# Patient Record
Sex: Female | Born: 1945
Health system: Southern US, Community
[De-identification: ages and names within clinical notes are randomized; demographics above are authoritative.]

## PROBLEM LIST (undated history)

## (undated) DIAGNOSIS — F32A Depression, unspecified: Secondary | ICD-10-CM

## (undated) DIAGNOSIS — I10 Essential (primary) hypertension: Secondary | ICD-10-CM

## (undated) DIAGNOSIS — F419 Anxiety disorder, unspecified: Secondary | ICD-10-CM

## (undated) DIAGNOSIS — G629 Polyneuropathy, unspecified: Secondary | ICD-10-CM

## (undated) DIAGNOSIS — K219 Gastro-esophageal reflux disease without esophagitis: Secondary | ICD-10-CM

## (undated) DIAGNOSIS — F329 Major depressive disorder, single episode, unspecified: Secondary | ICD-10-CM

## (undated) HISTORY — PX: OTHER SURGICAL HISTORY: SHX169

## (undated) HISTORY — DX: Anxiety disorder, unspecified: F41.9

## (undated) HISTORY — DX: Gastro-esophageal reflux disease without esophagitis: K21.9

## (undated) HISTORY — DX: Depression, unspecified: F32.A

## (undated) HISTORY — PX: NOSE SURGERY: SHX723

## (undated) HISTORY — DX: Essential (primary) hypertension: I10

## (undated) HISTORY — DX: Polyneuropathy, unspecified: G62.9

---
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
## 2016-04-04 DIAGNOSIS — R87612 Low grade squamous intraepithelial lesion on cytologic smear of cervix (LGSIL): Secondary | ICD-10-CM | POA: Insufficient documentation

## 2016-04-04 DIAGNOSIS — E782 Mixed hyperlipidemia: Secondary | ICD-10-CM | POA: Insufficient documentation

## 2018-08-07 DIAGNOSIS — S022XXA Fracture of nasal bones, initial encounter for closed fracture: Secondary | ICD-10-CM | POA: Insufficient documentation

## 2018-09-18 DIAGNOSIS — F1020 Alcohol dependence, uncomplicated: Secondary | ICD-10-CM | POA: Insufficient documentation

## 2018-09-18 DIAGNOSIS — W19XXXA Unspecified fall, initial encounter: Secondary | ICD-10-CM | POA: Insufficient documentation

## 2018-09-18 DIAGNOSIS — F32A Depression, unspecified: Secondary | ICD-10-CM | POA: Insufficient documentation

## 2018-09-18 DIAGNOSIS — S82851A Displaced trimalleolar fracture of right lower leg, initial encounter for closed fracture: Secondary | ICD-10-CM | POA: Insufficient documentation

## 2018-09-18 DIAGNOSIS — R55 Syncope and collapse: Secondary | ICD-10-CM | POA: Insufficient documentation

## 2019-08-24 ENCOUNTER — Encounter

## 2019-08-24 ENCOUNTER — Ambulatory Visit: Payer: Medicare Other | Admitting: Family Medicine

## 2019-08-24 DIAGNOSIS — Z0289 Encounter for other administrative examinations: Secondary | ICD-10-CM

## 2019-09-21 ENCOUNTER — Other Ambulatory Visit: Payer: Self-pay

## 2019-09-21 ENCOUNTER — Ambulatory Visit: Payer: Medicare Other | Admitting: Family Medicine

## 2019-09-21 ENCOUNTER — Encounter: Payer: Self-pay | Admitting: Family Medicine

## 2019-09-21 VITALS — BP 112/76 | HR 84 | Temp 97.0°F | Resp 16 | Ht 65.0 in | Wt 157.0 lb

## 2019-09-21 DIAGNOSIS — R7401 Elevation of levels of liver transaminase levels: Secondary | ICD-10-CM

## 2019-09-21 DIAGNOSIS — Z87898 Personal history of other specified conditions: Secondary | ICD-10-CM

## 2019-09-21 DIAGNOSIS — I10 Essential (primary) hypertension: Secondary | ICD-10-CM

## 2019-09-21 DIAGNOSIS — M545 Low back pain, unspecified: Secondary | ICD-10-CM

## 2019-09-21 DIAGNOSIS — G894 Chronic pain syndrome: Secondary | ICD-10-CM

## 2019-09-21 DIAGNOSIS — G6289 Other specified polyneuropathies: Secondary | ICD-10-CM

## 2019-09-21 DIAGNOSIS — M720 Palmar fascial fibromatosis [Dupuytren]: Secondary | ICD-10-CM

## 2019-09-21 DIAGNOSIS — R2681 Unsteadiness on feet: Secondary | ICD-10-CM

## 2019-09-21 DIAGNOSIS — G8929 Other chronic pain: Secondary | ICD-10-CM

## 2019-09-21 DIAGNOSIS — R600 Localized edema: Secondary | ICD-10-CM | POA: Diagnosis not present

## 2019-09-21 LAB — COMPREHENSIVE METABOLIC PANEL
ALT: 38 U/L — ABNORMAL HIGH (ref 0–35)
AST: 31 U/L (ref 0–37)
Albumin: 4.6 g/dL (ref 3.5–5.2)
Alkaline Phosphatase: 69 U/L (ref 39–117)
BUN: 18 mg/dL (ref 6–23)
CO2: 26 mEq/L (ref 19–32)
Calcium: 10.1 mg/dL (ref 8.4–10.5)
Chloride: 99 mEq/L (ref 96–112)
Creatinine, Ser: 1.05 mg/dL (ref 0.40–1.20)
GFR: 51.32 mL/min — ABNORMAL LOW (ref >60.00)
Glucose, Bld: 88 mg/dL (ref 70–99)
Potassium: 4.1 mEq/L (ref 3.5–5.1)
Sodium: 136 mEq/L (ref 135–145)
Total Bilirubin: 0.9 mg/dL (ref 0.2–1.2)
Total Protein: 7.1 g/dL (ref 6.0–8.3)

## 2019-09-21 NOTE — Patient Instructions (Signed)
A few things to remember from today's visit:   Elevated transaminase level - Plan: CMP  Bilateral lower extremity edema  Benign essential hypertension - Plan: CMP  Other polyneuropathy - Plan: Ambulatory referral to Neurology  Unsteady gait  Chronic low back pain without sciatica, unspecified back pain laterality - Plan: Ambulatory referral to Chiropractic  Dupuytren contracture - Plan: Ambulatory referral to Orthopedic Surgery  Today we decrease dose of furosemide, from 40 mg to 20 mg daily, and metoprolol succinate from 50 mg to 25 mg daily. Continue monitoring blood pressure at home. We will arrange home health for physical therapy. Recommend decreasing alcohol intake, 4 ounces daily is the recommended dose for women.  You can arrange appointment with podiatrist, [phone].  Please be sure medication list is accurate. If a new problem present, please set up appointment sooner than planned today.

## 2019-09-21 NOTE — Progress Notes (Signed)
HPI:   [name] is a 73 y.o. female, who is here today to establish care.  Former PCP: Dr Rebecca Baird Last preventive routine visit: Fall 2019.  Chronic medical problems: Syncope, peripheral neuropathy, dizziness, hypertension, exertional dyspnea, aortic valve stenosis, former smoker, chronic pain, GERD, alcohol abuse, anxiety, depression, and OA ambulance home.  She drinks about 2 to 3 glasses of wine daily. She is on thiamine 100 mg daily B12 at 1000 mcg daily.  Depression and anxiety: Currently she is on citalopram 10 mg daily. She was diagnosed with depression around 2009-01-20 after the death of her husband. She feels like the pressure is better and eventually she would like to wean off medication, she tried once but had to resume it, she realized that she was feeling better while taking medication.  She takes furosemide 40 mg daily, she is not sure about indication, she thinks it is to treat lower extremity edema, which she is "bad." She denies orthopnea or PND. No known history of CHF.  Hypertension: Currently she is on metoprolol succinate 50 mg daily. She is checking BP at home: 105/63, 116/72, 125/74, and 112\76. BP has been "very low", SBP in the 90's sometimes.  Dizziness and lightheadedness, aggravated by rapid rise from sitting position. Episodes of syncope since 01-20-2014, she is not sure about exacerbating factors. There is no associated urine/bowel incontinence or postictal-like symptoms. Negative for seizure-like activity.  She denies chest pain, dyspnea, palpitations, or urinary symptoms.  Results for orders placed or performed during the hospital encounter of 07/01/18  Magnesium Level  Result Value Ref Range  Magnesium 1.6 1.6 - 2.7 mg/dL  CBC with Differential  Result Value Ref Range  Differential Percent Diff %  Differential Absolute Diff Absolute  WBC 10.2 3.6 - 11.2 K/uL  RBC 3.82 3.63 - 4.92 M/uL  Hemoglobin 13.0 11.4 - 15.0 g/dL   Hematocrit 39 31 - 42 %  Mean Cell Volume 103 (H) 74 - 96 fL  Mean Cell Hemoglobin 34 (H) 26 - 33 pg  Mean Cell Hemoglobin Concentration 33 33 - 36 g/dL  RDW 13.9 12.3 - 17.0 %  Platelet Count 243 150 - 450 K/uL  Mean Platelet Volume 7.5 7.5 - 11.2 fL  Neutrophils 83 (H) 37 - 80 %  Lymphocytes 9 (L) 10 - 50 %  Monocytes 6 0 - 12 %  Eosinophils 1 0 - 7 %  Basophils 1 0 - 2 %  Neutrophils Absolute 8.6 1.3 - 9.0 K/uL  Lymphocytes Absolute 0.9 0.4 - 5.6 K/uL  Monocytes Absolute 0.6 0.0 - 1.3 K/uL  Eosinophils Absolute 0.1 0.0 - 0.8 K/uL  Basophils Absolute 0.1 0.0 - 0.2 K/uL  Nucleated RBCS 0 /100 WBC  BMP  Result Value Ref Range  Sodium 138 136 - 145 mmol/L  Potassium 3.8 3.5 - 5.1 mmol/L  Chloride 99 99 - 108 mmol/L  CO2 23 21 - 31 mmol/L  BUN 12 7 - 25 mg/dL  Creatinine 0.82 0.51 - 1.00 mg/dL  Glucose, Random 104 70 - 199 mg/dL  Calcium, Total 9.7 8.8 - 10.6 mg/dL  Osmolality (calculated) 276 270 - 295 mOsm/kg  Anion Gap 16 (H) 3 - 11  Troponin-I  Result Value Ref Range  Troponin-I <0.03 <0.05 ng/mL  EGFR (MDRD)  Result Value Ref Range  EGFR >60 mL/min/1.47m  Lab Add On  Result Value Ref Range  Lab Add On PROCESSED  PT/INR  Result Value Ref Range  PT 11.7 9.9 -  12.7 sec  INR 1.03 <1.30  Alcohol, Ethyl Level  Result Value Ref Range  Alcohol, Ethyl 57 0 - 80 mg/dL  Hepatic Function Panel  Result Value Ref Range  Albumin 4.1 3.5 - 5.7 g/dL  Bilirubin, Total 0.9 0.3 - 1.0 mg/dL  Bilirubin, Direct 0.3 (H) 0.0 - 0.2 mg/dL  Alkaline Phosphatase 111 (H) 34 - 104 IU/L  Protein, Total 6.9 6.4 - 8.9 g/dL  ALT 29 7 - 52 IU/L  AST 44 (H) 13 - 39 IU/L  Albumin/Globulin Ratio 1.5 1.2 - 2.3    GERD: Currently she is on omeprazole 20 mg, which she has taken for years and helping with heartburn. No changes in bowel habits, nausea, vomiting, or melena. Colonoscopy done about 11 years ago, for now she is interested in scheduling procedure.  Concerns today:  She has a  list of request that include several referrals to different specialities.  Peripheral neuropathy: She tried gabapentin, she is not sure why she discontinued medication. Following with neurologist, she needs a referral. According to patient, this is a hepatic. Numbness, tingling, and burning sensation on lower extremities and hands. Problem seems to be stable. She has been treated by pain management, currently she is on Nucynta ER 50 mg, which she takes once daily even though it was prescribed twice daily.  Tolerating medication well, she is not sure if medication is helping with neuropathy pain.  Complaining of lower back pain, no radiated.  This problem started early this year. Exacerbated by prolonged standing and bending when doing chores around the house (cleaning). Alleviated by rest. She has had lower back pain before, she was following with a chiropractor and it really helped at that time. Pain is "not bad."  Unstable gait. According to patient, she has had several fractures due to "fainting", some episodes of syncope. Last fall was complicated with left ankle fracture, she was supposed to do PT at home after inpatient rehab but due to to COVID-19, this was not possible. She states that she needs help with some ADLs, her husband helps her with showering. She has a cane for transfer. She does not drive.  Dupuytren's contracture, changes referral to orthopedist. She has received injections to treat 5th right finger but did not help. She feels like problem is developing in other fingers,left hand. She is right handed.  She also needs to see a podiatrist in the area. History of hammertoes.  Review of Systems  Constitutional: Positive for fatigue. Negative for activity change, appetite change and fever.  HENT: Negative for mouth sores, nosebleeds and trouble swallowing.   Eyes: Negative for redness and visual disturbance.  Respiratory: Negative for cough and wheezing.    Gastrointestinal: Negative for abdominal pain, nausea and vomiting.       Negative for changes in bowel habits.  Genitourinary: Negative for decreased urine volume, dysuria and hematuria.  Musculoskeletal: Positive for arthralgias and gait problem. Negative for joint swelling.  Skin: Negative for rash and wound.  Neurological: Negative for facial asymmetry, speech difficulty, weakness and headaches.  Psychiatric/Behavioral: Negative for confusion. The patient is nervous/anxious.   Rest see pertinent positives and negatives per HPI.   Current Outpatient Medications on File Prior to Visit  Medication Sig Dispense Refill  . Ascorbic Acid (VITAMIN C) 1000 MG tablet Take 1,000 mg by mouth daily.    . citalopram (CELEXA) 10 MG tablet Take 10 mg by mouth daily.    . Cyanocobalamin (VITAMIN B12 PO) Take 5,000 mg by mouth daily.    Marland Kitchen  folic acid (FOLVITE) 161 MCG tablet Take 400 mcg by mouth daily.    . furosemide (LASIX) 40 MG tablet Take 40 mg by mouth daily.    . Metoprolol Succinate 50 MG CS24 Take 50 mg by mouth daily.    . Omeprazole 20 MG TBDD Take 20 mg by mouth daily.    . tapentadol (NUCYNTA ER) 50 MG 12 hr tablet Take 50 mg by mouth 2 (two) times daily.    Marland Kitchen thiamine (VITAMIN B-1) 100 MG tablet Take 100 mg by mouth daily.    . valsartan (DIOVAN) 160 MG tablet Take 160 mg by mouth daily.     No current facility-administered medications on file prior to visit.      History reviewed. No pertinent past medical history. No Known Allergies  History reviewed. No pertinent family history.  Social History   Socioeconomic History  . Marital status: Married    Spouse name: Not on file  . Number of children: Not on file  . Years of education: Not on file  . Highest education level: Not on file  Occupational History  . Not on file  Social Needs  . Financial resource strain: Not on file  . Food insecurity    Worry: Not on file    Inability: Not on file  . Transportation needs     Medical: Not on file    Non-medical: Not on file  Tobacco Use  . Smoking status: Not on file  Substance and Sexual Activity  . Alcohol use: Not on file  . Drug use: Not on file  . Sexual activity: Not on file  Lifestyle  . Physical activity    Days per week: Not on file    Minutes per session: Not on file  . Stress: Not on file  Relationships  . Social Herbalist on phone: Not on file    Gets together: Not on file    Attends religious service: Not on file    Active member of club or organization: Not on file    Attends meetings of clubs or organizations: Not on file    Relationship status: Not on file  Other Topics Concern  . Not on file  Social History Narrative  . Not on file    Vitals:   09/21/19 1152  BP: 112/76  Pulse: 84  Resp: 16  Temp: (!) 97 F (36.1 C)  SpO2: 97%    Body mass index is 26.13 kg/m.  Physical Exam  Nursing note and vitals reviewed. Constitutional: She is oriented to person, place, and time. She appears well-developed. She does not appear ill. No distress.  HENT:  Head: Normocephalic and atraumatic.  Mouth/Throat: Oropharynx is clear and moist and mucous membranes are normal.  Eyes: Pupils are equal, round, and reactive to light. Conjunctivae are normal. Right eye exhibits abnormal extraocular motion.  Cardiovascular: Normal rate and regular rhythm.  Murmur (soft SEM RUSB) heard. Pulses:      Dorsalis pedis pulses are 2+ on the right side and 2+ on the left side.  Respiratory: Effort normal and breath sounds normal. No respiratory distress.  GI: Soft. She exhibits no mass. There is no hepatomegaly. There is no abdominal tenderness.  Musculoskeletal:        General: Edema (Trace pitting LE edema,bilateral.) present.     Right hand: She exhibits decreased range of motion and deformity (Right 5th finget:fixed flexion.). She exhibits no tenderness.  Lymphadenopathy:    She has no  cervical adenopathy.  Neurological: She is alert  and oriented to person, place, and time. She has normal strength. No cranial nerve deficit. Gait abnormal.  Unstable gait, she is using a cane.  Skin: Skin is warm. No rash noted. No erythema.  Psychiatric: Her mood appears anxious.  Well groomed, good eye contact.   ASSESSMENT AND PLAN:  Ms. Kadasia was seen today for establish care.  Diagnoses and all orders for this visit:  Lab Results  Component Value Date   ALT 38 (H) 09/21/2019   AST 31 09/21/2019   ALKPHOS 69 09/21/2019   BILITOT 0.9 09/21/2019   Lab Results  Component Value Date   CREATININE 1.05 09/21/2019   BUN 18 09/21/2019   NA 136 09/21/2019   K 4.1 09/21/2019   CL 99 09/21/2019   CO2 26 09/21/2019    Elevated transaminase level Strongly recommend decreasing alcohol intake, educated about current recommendations for women in regard to alcohol consumption. We also discussed adverse effects of high alcohol consumption.  -     CMP  Bilateral lower extremity edema Possible etiology discussed,?  Vein disease. Recommend lower extremity elevation and/or compression stockings. We discussed some side effects of diuretics, she agrees with decreasing dose of furosemide from 40 mg to 20 mg daily.  Benign essential hypertension BP mildly low. Recommend decreasing metoprolol succinate 50 mg to 25 mg. Continue monitoring BP regularly. Continue low-salt diet.  -     CMP  Other polyneuropathy Apparently gabapentin did not help. Continue follow-up with neurology. She does not recall trying Cymbalta, this could be something we can try in the future. Appropriate foot care.  Pain that she does not need a referral for podiatrist, she will let me know if she does.  -     Ambulatory referral to Neurology -     Ambulatory referral to [place]a gait High risk for falls, aggravated by chronic medical problems as well as medications. Fall precautions discussed. PT for fall prevention will be arranged  through home health.  Chronic low back pain without sciatica, unspecified back pain laterality States that she needs a referral for chiropractor, it was placed. PT through home health may also help.  -     Ambulatory referral to Chiropractic -     Ambulatory referral to [place] contracture Problem is getting worse. Referral placed as requested.  -     Ambulatory referral to Orthopedic Surgery  Chronic pain disorder For now we will continue prescribing Nucynta. Last prescription for Nucynta ER was on 09/04/2019, 60 tablets. We will have a discussion about med contract and current guidelines in regard to chronic opioid use.  History of syncope No known etiology. ?  Orthostatic hypotension. Some of the medication were adjusted: Furosemide dose decreased and metoprolol succinate was decreased. Instructed about warning signs.    Return in about 4 weeks (around 10/19/2019) for Pain ,HTN,LE edema.     G. Martinique, MD  [facility]. [place] office.

## 2019-09-22 ENCOUNTER — Encounter: Payer: Self-pay | Admitting: Family Medicine

## 2019-09-22 DIAGNOSIS — R7989 Other specified abnormal findings of blood chemistry: Secondary | ICD-10-CM | POA: Insufficient documentation

## 2019-09-22 DIAGNOSIS — G629 Polyneuropathy, unspecified: Secondary | ICD-10-CM | POA: Insufficient documentation

## 2019-09-22 DIAGNOSIS — G894 Chronic pain syndrome: Secondary | ICD-10-CM | POA: Insufficient documentation

## 2019-09-22 DIAGNOSIS — R945 Abnormal results of liver function studies: Secondary | ICD-10-CM | POA: Insufficient documentation

## 2019-09-29 ENCOUNTER — Other Ambulatory Visit: Payer: Self-pay

## 2019-09-29 ENCOUNTER — Ambulatory Visit: Payer: Medicare Other | Admitting: Orthopaedic Surgery

## 2019-09-29 DIAGNOSIS — M720 Palmar fascial fibromatosis [Dupuytren]: Secondary | ICD-10-CM | POA: Insufficient documentation

## 2019-09-29 NOTE — Progress Notes (Signed)
Office Visit Note   Patient: Rebecca Baird           Date of Birth: [birth year]           MRN: [id number] Visit Date: 09/29/2019              Requested by: [place], Betty G, MD [address] [place],  [place] [address] PCP: [place], Betty G, MD   Assessment & Plan: Visit Diagnoses:  1. Dupuytren's contracture of right hand     Plan: Impression is right small finger Dupuytren's contracture.  Based on discussion today I will make a referral to Dr. Merlyn Lot for surgical evaluation of her right small finger Dupuytren's contracture.  Follow-up as needed.  Follow-Up Instructions: Return if symptoms worsen or fail to improve.   Orders:  No orders of the defined types were placed in this encounter.  No orders of the defined types were placed in this encounter.     Procedures: No procedures performed   Clinical Data: No additional findings.   Subjective: Chief Complaint  Patient presents with  . Right Hand - Pain    [name] is a 73 year old female comes in for evaluation of her Dupuytren's contracture of the right small finger.  She recently relocated here from [place].  She was originally scheduled for surgery for this but due to the relocation she wants to establish new orthopedic care as well as neurology and primary care.  She has had Xiaflex injections to the finger which caused her tremendous pain and she is not interested in trying this again.   Review of Systems  Constitutional: Negative.   HENT: Negative.   Eyes: Negative.   Respiratory: Negative.   Cardiovascular: Negative.   Endocrine: Negative.   Musculoskeletal: Negative.   Neurological: Negative.   Hematological: Negative.   Psychiatric/Behavioral: Negative.   All other systems reviewed and are negative.    Objective: Vital Signs: There were no vitals taken for this visit.  Physical Exam Vitals signs and nursing note reviewed.  Constitutional:      Appearance: She is well-developed.   HENT:     Head: Normocephalic and atraumatic.  Neck:     Musculoskeletal: Neck supple.  Pulmonary:     Effort: Pulmonary effort is normal.  Abdominal:     Palpations: Abdomen is soft.  Skin:    General: Skin is warm.     Capillary Refill: Capillary refill takes less than 2 seconds.  Neurological:     Mental Status: She is alert and oriented to person, place, and time.  Psychiatric:        Behavior: Behavior normal.        Thought Content: Thought content normal.        Judgment: Judgment normal.     Ortho Exam Examination of the right hand shows mild Dupuytren's contracture of the long finger at the MCP joint.  She has a 90 degree contracture of the PIP joint of the right small finger and a 30 degree contracture of the MCP joint. Specialty Comments:  No specialty comments available.  Imaging: No results found.   PMFS History: Patient Active Problem List   Diagnosis Date Noted  . Dupuytren's contracture of right hand 09/29/2019  . Chronic pain disorder 09/22/2019  . Elevated transaminase level 09/22/2019  . Peripheral neuropathy 09/22/2019  . History of syncope 09/21/2019  . Benign essential hypertension 09/21/2019  . Bilateral lower extremity edema 09/21/2019   No past medical history on file.  No  family history on file.  No past surgical history on file. Social History   Occupational History  . Not on file  Tobacco Use  . Smoking status: Former Smoker    Types: Cigarettes  . Smokeless tobacco: Never Used  Substance and Sexual Activity  . Alcohol use: Yes    Alcohol/week: 3.0 standard drinks    Types: 3 Glasses of wine per week  . Drug use: Not on file  . Sexual activity: Not on file

## 2019-09-30 NOTE — Addendum Note (Signed)
Addended by: Precious Bard on: 09/30/2019 09:23 AM   Modules accepted: Orders

## 2019-10-19 ENCOUNTER — Other Ambulatory Visit: Payer: Self-pay

## 2019-10-20 ENCOUNTER — Ambulatory Visit: Payer: Medicare Other | Admitting: Family Medicine

## 2019-10-20 ENCOUNTER — Encounter: Payer: Self-pay | Admitting: Family Medicine

## 2019-10-20 VITALS — BP 120/70 | HR 102 | Resp 16 | Ht 65.0 in | Wt 161.1 lb

## 2019-10-20 DIAGNOSIS — Z0000 Encounter for general adult medical examination without abnormal findings: Secondary | ICD-10-CM

## 2019-10-20 DIAGNOSIS — I10 Essential (primary) hypertension: Secondary | ICD-10-CM | POA: Diagnosis not present

## 2019-10-20 DIAGNOSIS — R2681 Unsteadiness on feet: Secondary | ICD-10-CM | POA: Insufficient documentation

## 2019-10-20 DIAGNOSIS — R269 Unspecified abnormalities of gait and mobility: Secondary | ICD-10-CM | POA: Insufficient documentation

## 2019-10-20 DIAGNOSIS — G894 Chronic pain syndrome: Secondary | ICD-10-CM

## 2019-10-20 DIAGNOSIS — R7989 Other specified abnormal findings of blood chemistry: Secondary | ICD-10-CM

## 2019-10-20 DIAGNOSIS — R600 Localized edema: Secondary | ICD-10-CM

## 2019-10-20 DIAGNOSIS — R945 Abnormal results of liver function studies: Secondary | ICD-10-CM | POA: Diagnosis not present

## 2019-10-20 NOTE — Assessment & Plan Note (Addendum)
This seems to be a chronic problem.06/2018 alk phosphatase elevated at 111 and AST 44. Reiterated importance of decreasing alcohol intake. We will plan on checking LFTs next visit.

## 2019-10-20 NOTE — Assessment & Plan Note (Addendum)
Stable. Continue furosemide 20 mg daily. We discussed some side effects of medication. Lower extremity elevation may also help, mainly during warm weather, when problem could get worse (vein disease).

## 2019-10-20 NOTE — Assessment & Plan Note (Addendum)
Otherwise adequately controlled. Continue valsartan 160 mg daily and metoprolol succinate 25 mg daily. Continue monitoring BP regularly. Continue low-salt diet. Eye exam is current. Follow-up in 4 months.

## 2019-10-20 NOTE — Progress Notes (Signed)
HPI:   Rebecca Baird is a 73 y.o. female, who is here today for AWV and chronic disease management.  She was last seen on 09/21/2019.  She lives with her husband. She needs assistance with some ADLs and for most IADLs. She does not drive. She has not had any falls in the past year, ongoing PT at home currently.  Functional Status Survey: Is the patient deaf or have difficulty hearing?: No Does the patient have difficulty seeing, even when wearing glasses/contacts?: No Does the patient have difficulty concentrating, remembering, or making decisions?: No Does the patient have difficulty walking or climbing stairs?: Yes Does the patient have difficulty dressing or bathing?: Yes Does the patient have difficulty doing errands alone such as visiting a doctor's office or shopping?: Yes  Fall Risk  10/20/2019  Falls in the past year? 0  Number falls in past yr: 0  Injury with Fall? 0  Risk for fall due to : Impaired balance/gait;Orthopedic patient  Follow up Education provided    Providers she sees regularly: Eye care provider: Dr Dwyane Dee in [place]. She recently saw a new provider in [place] but doe snot remember name. Cardiologist, Dr Shearon Stalls, last seen in 08/2018. Pending appt with neurologist, Dr Posey Pronto.  Depression screen PHQ 2/9 10/20/2019  Decreased Interest 0  Down, Depressed, Hopeless 0  PHQ - 2 Score 0  History of depression, she would like to start weaning off Celexa 10 mg.  Mini-Cog - 10/20/19 1310    Normal clock drawing test?  yes    How many words correct?  3        Hearing Screening   '125Hz'$  '250Hz'$  '500Hz'$  '1000Hz'$  '2000Hz'$  '3000Hz'$  '4000Hz'$  '6000Hz'$  '8000Hz'$   Right ear:           Left ear:             Visual Acuity Screening   Right eye Left eye Both eyes  Without correction:     With correction: '20/25 20/25 20/25 '$   Hypertension: Last visit metoprolol succinate 50 mg was decreased to 25 mg (1/2 tablet). She is also on valsartan 160 mg daily. BP  readings at home: 140/85, 122/66, 114/60, 140/62, 138/66. She denies unusual headache, chest pain, dyspnea, palpitations, focal deficit, or worsening edema.  Lower extremity edema: Last visit furosemide was decreased from 40 mg to 20 mg daily. She feels like lower extremity edema is better. She has not noted erythema, rash, or ulcers.  She has an appointment with neurologist next week to discussed peripheral neuropathy.  Unstable gait has improved with PT at home.  Chronic lower back pain, she canceled appointment with chiropractor because back pain improved with PT. Currently she is on Nucynta ER 50 mg daily, she would like to start weaning medication off.  Review of Systems  Constitutional: Positive for fatigue. Negative for activity change, appetite change and fever.  HENT: Negative for mouth sores, nosebleeds and sore throat.   Eyes: Negative for redness and visual disturbance.  Respiratory: Negative for cough and wheezing.   Gastrointestinal: Negative for abdominal pain, nausea and vomiting.       Negative for changes in bowel habits.  Genitourinary: Negative for decreased urine volume, dysuria and hematuria.  Musculoskeletal: Positive for gait problem.  Skin: Negative for pallor and wound.  Neurological: Negative for syncope, facial asymmetry and speech difficulty.  Psychiatric/Behavioral: Negative for confusion. The patient is nervous/anxious.   Rest of ROS, see pertinent positives sand negatives in HPI  Current Outpatient Medications on File Prior to Visit  Medication Sig Dispense Refill  . Ascorbic Acid (VITAMIN C) 1000 MG tablet Take 1,000 mg by mouth daily.    . citalopram (CELEXA) 10 MG tablet Take 10 mg by mouth daily.    . Cyanocobalamin (VITAMIN B12 PO) Take 5,000 mg by mouth daily.    . folic acid (FOLVITE) 094 MCG tablet Take 400 mcg by mouth daily.    . furosemide (LASIX) 40 MG tablet Take 20 mg by mouth daily.    . Metoprolol Succinate 50 MG CS24 Take 25 mg by  mouth daily.    . Omeprazole 20 MG TBDD Take 20 mg by mouth daily.    . tapentadol (NUCYNTA ER) 50 MG 12 hr tablet Take 50 mg by mouth 2 (two) times daily.    Marland Kitchen thiamine (VITAMIN B-1) 100 MG tablet Take 100 mg by mouth daily.    . valsartan (DIOVAN) 160 MG tablet Take 160 mg by mouth daily.     No current facility-administered medications on file prior to visit.     Past Medical History:  Diagnosis Date  . Anxiety   . Depression   . GERD (gastroesophageal reflux disease)   . Hypertension   . Peripheral neuropathy    Allergies  Allergen Reactions  . Epinephrine Other (See Comments) and Palpitations    "Heart Races Uncomfortably"     Social History   Socioeconomic History  . Marital status: Married    Spouse name: Not on file  . Number of children: Not on file  . Years of education: Not on file  . Highest education level: Not on file  Occupational History  . Not on file  Social Needs  . Financial resource strain: Not on file  . Food insecurity    Worry: Not on file    Inability: Not on file  . Transportation needs    Medical: Not on file    Non-medical: Not on file  Tobacco Use  . Smoking status: Former Smoker    Types: Cigarettes  . Smokeless tobacco: Never Used  Substance and Sexual Activity  . Alcohol use: Yes    Alcohol/week: 3.0 standard drinks    Types: 3 Glasses of wine per week  . Drug use: Not on file  . Sexual activity: Not on file  Lifestyle  . Physical activity    Days per week: Not on file    Minutes per session: Not on file  . Stress: Not on file  Relationships  . Social Herbalist on phone: Not on file    Gets together: Not on file    Attends religious service: Not on file    Active member of club or organization: Not on file    Attends meetings of clubs or organizations: Not on file    Relationship status: Not on file  Other Topics Concern  . Not on file  Social History Narrative  . Not on file    Vitals:   10/20/19 1157   BP: 120/70  Pulse: (!) 102  Resp: 16  SpO2: 97%   Body mass index is 26.81 kg/m.   Physical Exam  Nursing note and vitals reviewed. Constitutional: She is oriented to person, place, and time. She appears well-developed. No distress.  HENT:  Head: Normocephalic and atraumatic.  Mouth/Throat: Oropharynx is clear and moist and mucous membranes are normal.  Eyes: Pupils are equal, round, and reactive to light. Conjunctivae are normal.  Cardiovascular:  Normal rate and regular rhythm.  Murmur (SEM I/VI RUSB) heard. Peripheral pulses present bilateral. HR by my count 100/min.  Respiratory: Effort normal and breath sounds normal. No respiratory distress.  GI: Soft. She exhibits no mass. There is no hepatomegaly. There is no abdominal tenderness.  Musculoskeletal:        General: No edema.  Lymphadenopathy:    She has no cervical adenopathy.  Neurological: She is alert and oriented to person, place, and time. She has normal strength. A sensory deficit (LE, bilateral.) is present. No cranial nerve deficit.  Unstable gait assisted with a cane.  Skin: Skin is warm. No rash noted. No erythema.  Psychiatric: Her mood appears anxious.  Well groomed, good eye contact.   ASSESSMENT AND PLAN:  Ms. Rebecca Baird was seen today for chronic disease management and AWV.  No orders of the defined types were placed in this encounter. Medicare annual wellness visit, subsequent We discussed the importance of staying active, physically and mentally, as well as the benefits of a healthy/balance diet. Low impact exercise that involve stretching and strengthing are ideal, as tolerated. Vaccines up to date. She thinks HCV screening has been done in the past. She would like to hold on colonoscopy, mammogram, and DEXA until she feels safer going to doctor's office.  We discussed preventive screening for the next 5-10 years, summery of recommendations given in AVS. Fall prevention.  Advance  directives and end of life discussed, she does not have POA or living will, strongly recommend end-of-life planning.  She has some forms at home, advanced health directives.   Benign essential hypertension Otherwise adequately controlled. Continue valsartan 160 mg daily and metoprolol succinate 25 mg daily. Continue monitoring BP regularly. Continue low-salt diet. Eye exam is current. Follow-up in 4 months.   Bilateral lower extremity edema Stable. Continue furosemide 20 mg daily. We discussed some side effects of medication. Lower extremity elevation may also help, mainly during warm weather, when problem could get worse (vein disease).  Chronic pain disorder She would like to start weaning off Nucynta ER.She is planning on seeing her pain manager next week to discuss problem.  Abnormal LFTs (liver function tests) This seems to be a chronic problem.06/2018 alk phosphatase elevated at 111 and AST 44. Reiterated importance of decreasing alcohol intake. We will plan on checking LFTs next visit.  Unstable gait Multifactorial, most likely related to chronic back pain,OA,and peripheral neuropathy. It could be aggravated by some of her medications. Problem has improved with PT at home. Fall prevention discussed.   Return in about 4 months (around 02/18/2020).   Fredie Majano G. Martin[place], MD  [facility]. [place] office.

## 2019-10-20 NOTE — Patient Instructions (Addendum)
  Rebecca Baird , Thank you for taking time to come for your Medicare Wellness Visit. I appreciate your ongoing commitment to your health goals. Please review the following plan we discussed and let me know if I can assist you in the future.   These are the goals we discussed: Goals   None     This is a list of the screening recommended for you and due dates:  Health Maintenance  Topic Date Due  .  Hepatitis C: One time screening is recommended by Center for Disease Control  (CDC) for  adults born from [birth year] through 1965.   [birth year]  . Tetanus Vaccine  05/20/1965  . Mammogram  05/20/1996  . Colon Cancer Screening  05/20/1996  . DEXA scan (bone density measurement)  05/21/2011  . Pneumonia vaccines (1 of 2 - PCV13) 05/21/2011  . Flu Shot  Completed    We need to find out about hep C screening.   A few tips:  -As we age balance is not as good as it was, so there is a higher risks for falls. Please remove small rugs and furniture that is "in your way" and could increase the risk of falls. Stretching exercises may help with fall prevention: Yoga and Tai Chi are some examples. Low impact exercise is better, so you are not very achy the next day.  -Sun screen and avoidance of direct sun light recommended. Caution with dehydration, if working outdoors be sure to drink enough fluids.  - Some medications are not safe as we age, increases the risk of side effects and can potentially interact with other medication you are also taken;  including some of over the counter medications. Be sure to let me know when you start a new medication even if it is a dietary/vitamin supplement.   -Healthy diet low in red meet/animal fat and sugar + regular physical activity is recommended.     Screening schedule for the next 5-10 years:  Colonoscopy  Glaucoma screening/eye exam every 1-2 years.  Mammogram for breast cancer screening annually.  Flu vaccine annually.  Fall prevention   Advance  directives:  Please see a lawyer and/or go to this website to help you with advanced directives and designating a health care power of attorney so that your wishes will be followed should you become too ill to make your own medical decisions.  [email or web address]

## 2019-10-20 NOTE — Assessment & Plan Note (Signed)
She would like to start weaning off Nucynta ER.She is planning on seeing her pain manager next week to discuss problem.

## 2019-10-20 NOTE — Assessment & Plan Note (Addendum)
Multifactorial, most likely related to chronic back pain,OA,and peripheral neuropathy. It could be aggravated by some of her medications. Problem has improved with PT at home. Fall prevention discussed.

## 2019-10-26 ENCOUNTER — Ambulatory Visit: Payer: Medicare Other | Admitting: Neurology

## 2019-12-10 ENCOUNTER — Encounter: Payer: Self-pay | Admitting: Radiology

## 2019-12-14 ENCOUNTER — Other Ambulatory Visit: Payer: Self-pay

## 2019-12-14 ENCOUNTER — Encounter: Payer: Self-pay | Admitting: Neurology

## 2019-12-14 ENCOUNTER — Ambulatory Visit: Payer: Medicare PPO | Admitting: Neurology

## 2019-12-14 VITALS — BP 140/88 | HR 66 | Ht 65.5 in | Wt 160.0 lb

## 2019-12-14 DIAGNOSIS — G621 Alcoholic polyneuropathy: Secondary | ICD-10-CM | POA: Diagnosis not present

## 2019-12-14 NOTE — Progress Notes (Signed)
[facility] Clinic Note - Initial Visit   Date: 12/14/19  Rebecca Baird MRN: [id number] DOB: 20-Aug-1946   Dear Dr. Martinique:  Thank you for your kind referral of Rebecca Baird for consultation of neuropathy. Although her history is well known to you, please allow [place] to reiterate it for the purpose of our medical record. The patient was accompanied to the clinic by self.   History of Present Illness: Rebecca Baird is a 74 y.o. female with hypertension, chronic pain, depression, and GERD presenting for evaluation of neuropathy.   She has neuropathy in the feet which has been present for the past several years and started to involve the hands over the past year.  She had numbness as well as tingling and stabbing pain.  She was diagnosed at [facility] where she had NCS/EMG of the legs.   She has imbalance, dizziness, and falls. Her dizziness is more of unsteadiness and imbalance, she does not have spinning sensation.  She completed physical therapy.  She had no history of diabetes.  She enjoys wine and drinks ~3 glasses nightly for many years.  At some point, her provider has addressed her alcohol dependency because she is taking vitamin B1, B76, and folic acid supplements.   She also complains of significant arthritic pain involving the hands and feet.  She has Dupuytren's contracture in the right hand and hammer toes bilaterally.  She was seeing Pain Management and takes Nucynta.  She has previously tried gabapentin and Lyrica which did not help.  She is hoping to establish care for pain management.  Patient was informed that our practice does not do chronic pain management.     Past Medical History:  Diagnosis Date  . Anxiety   . Depression   . GERD (gastroesophageal reflux disease)   . Hypertension   . Peripheral neuropathy     Past Surgical History:  Procedure Laterality Date  . broken ankle    . cataract surgery    .  NOSE SURGERY       Medications:  Outpatient Encounter Medications as of 12/14/2019  Medication Sig  . Ascorbic Acid (VITAMIN C) 1000 MG tablet Take 1,000 mg by mouth daily.  . citalopram (CELEXA) 10 MG tablet Take 10 mg by mouth daily.  . Cyanocobalamin (VITAMIN B12 PO) Take 5,000 mg by mouth daily.  . folic acid (FOLVITE) 283 MCG tablet Take 400 mcg by mouth daily.  . furosemide (LASIX) 40 MG tablet Take 20 mg by mouth daily.  . metoprolol succinate (TOPROL-XL) 50 MG 24 hr tablet   . Metoprolol Succinate 50 MG CS24 Take 25 mg by mouth daily.  . Omeprazole 20 MG TBDD Take 20 mg by mouth daily.  . tapentadol (NUCYNTA ER) 50 MG 12 hr tablet Take 50 mg by mouth 2 (two) times daily.  Marland Kitchen thiamine (VITAMIN B-1) 100 MG tablet Take 100 mg by mouth daily.  . valsartan (DIOVAN) 160 MG tablet Take 160 mg by mouth daily.   No facility-administered encounter medications on file as of 12/14/2019.    Allergies:  Allergies  Allergen Reactions  . Epinephrine Other (See Comments) and Palpitations    "Heart Races Uncomfortably"     Family History: History reviewed. No pertinent family history.  Social History: Social History   Tobacco Use  . Smoking status: Former Smoker    Types: Cigarettes  . Smokeless tobacco: Never Used  Substance Use Topics  . Alcohol use: Yes    Alcohol/week:  3.0 standard drinks    Types: 3 Glasses of wine per week  . Drug use: Not on file   Social History   Social History Narrative   Right handed   One story home   Drinks occasional caffeine    Vital Signs:  BP 140/88   Pulse 66   Ht 5' 5.5" (1.664 m)   Wt 160 lb (72.6 kg)   SpO2 98%   BMI 26.22 kg/m   Neurological Exam: MENTAL STATUS including orientation to time, place, person, recent and remote memory, attention span and concentration, language, and fund of knowledge is normal.  Speech is not dysarthric.  CRANIAL NERVES: II:  No visual field defects.     III-IV-VI: Pupils equal round and reactive  to light.  Normal conjugate, extra-ocular eye movements in all directions of gaze.  No nystagmus.  No ptosis.   V:  Normal facial sensation.    VII:  Normal facial symmetry and movements.   VIII:  Normal hearing and vestibular function.   IX-X:  Normal palatal movement.   XI:  Normal shoulder shrug and head rotation.   XII:  Normal tongue strength and range of motion, no deviation or fasciculation.  MOTOR:  No atrophy, fasciculations or abnormal movements.  No pronator drift.  Right hand with Dupuytren's contracture.  Bilateral hammer toes.  Callous at the base of the right great toe  Upper Extremity:  Right  Left  Deltoid  5/5   5/5   Biceps  5/5   5/5   Triceps  5/5   5/5   Infraspinatus 5/5  5/5  Medial pectoralis 5/5  5/5  Wrist extensors  5/5   5/5   Wrist flexors  5/5   5/5   Finger extensors  5/5   5/5   Finger flexors  5/5   5/5   Dorsal interossei  5/5   5/5   Abductor pollicis  5/5   5/5   Tone (Ashworth scale)  0  0   Lower Extremity:  Right  Left  Hip flexors  5/5   5/5   Hip extensors  5/5   5/5   Adductor 5/5  5/5  Abductor 5/5  5/5  Knee flexors  5/5   5/5   Knee extensors  5/5   5/5   Dorsiflexors  5/5   5/5   Plantarflexors  5/5   5/5   Toe extensors  5/5   5/5   Toe flexors  5/5   5/5   Tone (Ashworth scale)  0  0   MSRs:  Right        Left                  brachioradialis 2+  2+  biceps 2+  2+  triceps 2+  2+  patellar 2+  2+  ankle jerk 0  0  Hoffman no  no  plantar response down  down   SENSORY:  Vibration is absent at the ankles bilaterally, mildly reduced at the knees.  There is a gradient pattern of sensory loss to pin prick and temperature from below the knee down into the feet.  Rhomberg sign is positive.  COORDINATION/GAIT: Normal finger-to- nose-finger.  Intact rapid alternating movements bilaterally. Gait is unsteady, assisted with cane, wide-based.   IMPRESSION: Alcohol-induced peripheral neuropathy manifesting with stocking glove  distribution of paresthesias and sensory ataxia Her neurological examination shows a distal predominant large fiber peripheral neuropathy. I had extensive discussion with  the patient regarding the pathogenesis, etiology, management, and natural course of neuropathy. Neuropathy tends to be slowly progressive, especially if a underlying etiology is not adequately managed. In her case, she has a long history of alcohol use and this is the culprit.  I educated patient on the importance of abstaining from alcohol to minimize progression. Continue vitamin B1, B12, and folic acid supplementation as she is taking Patient educated on daily foot inspection, fall prevention, and safety precautions around the home.   Start using shower chair I encouraged her to use a walker, as a cane is not serving her well.  She completed balance training in the fall.  Regarding pain management, she is on controlled substances which is outside of the realm of my practice and I informed that she would be best served by pain management specialist.  Recommend that she see podiatry for foot care.   She will request referral for pain management and podiatry through her PCP's office  Return to clinic as needed  Total time spent:  45 min  Thank you for allowing me to participate in patient's care.  If I can answer any additional questions, I would be pleased to do so.    Sincerely,    Laikyn Gewirtz K. Allena Katz, DO

## 2019-12-14 NOTE — Patient Instructions (Signed)
Start to use a walker to improve your balance and reduce fall risk  Start to use a shower chair  Check your feet daily  Establish care with podiatry and pain management  Please try to cut back on alcohol intake

## 2019-12-22 ENCOUNTER — Other Ambulatory Visit: Payer: Self-pay

## 2019-12-22 ENCOUNTER — Ambulatory Visit: Payer: Medicare PPO | Admitting: Podiatry

## 2019-12-22 ENCOUNTER — Encounter: Payer: Self-pay | Admitting: Podiatry

## 2019-12-22 VITALS — BP 134/78 | HR 99

## 2019-12-22 DIAGNOSIS — Q667 Congenital pes cavus, unspecified foot: Secondary | ICD-10-CM | POA: Diagnosis not present

## 2019-12-22 DIAGNOSIS — G6289 Other specified polyneuropathies: Secondary | ICD-10-CM | POA: Diagnosis not present

## 2019-12-22 DIAGNOSIS — M79674 Pain in right toe(s): Secondary | ICD-10-CM | POA: Diagnosis not present

## 2019-12-22 DIAGNOSIS — B351 Tinea unguium: Secondary | ICD-10-CM

## 2019-12-22 DIAGNOSIS — M79675 Pain in left toe(s): Secondary | ICD-10-CM | POA: Diagnosis not present

## 2019-12-22 DIAGNOSIS — L84 Corns and callosities: Secondary | ICD-10-CM

## 2019-12-22 NOTE — Patient Instructions (Addendum)
Corns and Calluses Corns are small areas of thickened skin that occur on the top, sides, or tip of a toe. They contain a cone-shaped core with a point that can press on a nerve below. This causes pain.  Calluses are areas of thickened skin that can occur anywhere on the body, including the hands, fingers, palms, soles of the feet, and heels. Calluses are usually larger than corns. What are the causes? Corns and calluses are caused by rubbing (friction) or pressure, such as from shoes that are too tight or do not fit properly. What increases the risk? Corns are more likely to develop in people who have misshapen toes (toe deformities), such as hammer toes. Calluses can occur with friction to any area of the skin. They are more likely to develop in people who:  Work with their hands.  Wear shoes that fit poorly, are too tight, or are high-heeled.  Have toe deformities. What are the signs or symptoms? Symptoms of a corn or callus include:  A hard growth on the skin.  Pain or tenderness under the skin.  Redness and swelling.  Increased discomfort while wearing tight-fitting shoes, if your feet are affected. If a corn or callus becomes infected, symptoms may include:  Redness and swelling that gets worse.  Pain.  Fluid, blood, or pus draining from the corn or callus. How is this diagnosed? Corns and calluses may be diagnosed based on your symptoms, your medical history, and a physical exam. How is this treated? Treatment for corns and calluses may include:  Removing the cause of the friction or pressure. This may involve: ? Changing your shoes. ? Wearing shoe inserts (orthotics) or other protective layers in your shoes, such as a corn pad. ? Wearing gloves.  Applying medicine to the skin (topical medicine) to help soften skin in the hardened, thickened areas.  Removing layers of dead skin with a file to reduce the size of the corn or callus.  Removing the corn or callus with a  scalpel or laser.  Taking antibiotic medicines, if your corn or callus is infected.  Having surgery, if a toe deformity is the cause. Follow these instructions at home:   Take over-the-counter and prescription medicines only as told by your health care provider.  If you were prescribed an antibiotic, take it as told by your health care provider. Do not stop taking it even if your condition starts to improve.  Wear shoes that fit well. Avoid wearing high-heeled shoes and shoes that are too tight or too loose.  Wear any padding, protective layers, gloves, or orthotics as told by your health care provider.  Soak your hands or feet and then use a file or pumice stone to soften your corn or callus. Do this as told by your health care provider.  Check your corn or callus every day for symptoms of infection. Contact a health care provider if you:  Notice that your symptoms do not improve with treatment.  Have redness or swelling that gets worse.  Notice that your corn or callus becomes painful.  Have fluid, blood, or pus coming from your corn or callus.  Have new symptoms. Summary  Corns are small areas of thickened skin that occur on the top, sides, or tip of a toe.  Calluses are areas of thickened skin that can occur anywhere on the body, including the hands, fingers, palms, and soles of the feet. Calluses are usually larger than corns.  Corns and calluses are caused by   rubbing (friction) or pressure, such as from shoes that are too tight or do not fit properly.  Treatment may include wearing any padding, protective layers, gloves, or orthotics as told by your health care provider. This information is not intended to replace advice given to you by your health care provider. Make sure you discuss any questions you have with your health care provider. Document Revised: 02/18/2019 Document Reviewed: 09/11/2017 Elsevier Patient Education  2020 [employer]  Peripheral Neuropathy  Peripheral neuropathy is a type of nerve damage. It affects nerves that carry signals between the spinal cord and the arms, legs, and the rest of the body (peripheral nerves). It does not affect nerves in the spinal cord or brain. In peripheral neuropathy, one nerve or a group of nerves may be damaged. Peripheral neuropathy is a broad category that includes many specific nerve disorders, like diabetic neuropathy, hereditary neuropathy, and carpal tunnel syndrome. What are the causes? This condition may be caused by:  Diabetes. This is the most common cause of peripheral neuropathy.  Nerve injury.  Pressure or stress on a nerve that lasts a long time.  Lack (deficiency) of B vitamins. This can result from alcoholism, poor diet, or a restricted diet.  Infections.  Autoimmune diseases, such as rheumatoid arthritis and systemic lupus erythematosus.  Nerve diseases that are passed from parent to child (inherited).  Some medicines, such as cancer medicines (chemotherapy).  Poisonous (toxic) substances, such as lead and mercury.  Too little blood flowing to the legs.  Kidney disease.  Thyroid disease. In some cases, the cause of this condition is not known. What are the signs or symptoms? Symptoms of this condition depend on which of your nerves is damaged. Common symptoms include:  Loss of feeling (numbness) in the feet, hands, or both.  Tingling in the feet, hands, or both.  Burning pain.  Very sensitive skin.  Weakness.  Not being able to move a part of the body (paralysis).  Muscle twitching.  Clumsiness or poor coordination.  Loss of balance.  Not being able to control your bladder.  Feeling dizzy.  Sexual problems. How is this diagnosed? Diagnosing and finding the cause of peripheral neuropathy can be difficult. Your health care provider will take your medical history and do a physical exam. A neurological exam will also be done. This involves checking things  that are affected by your brain, spinal cord, and nerves (nervous system). For example, your health care provider will check your reflexes, how you move, and what you can feel. You may have other tests, such as:  Blood tests.  Electromyogram (EMG) and nerve conduction tests. These tests check nerve function and how well the nerves are controlling the muscles.  Imaging tests, such as CT scans or MRI to rule out other causes of your symptoms.  Removing a small piece of nerve to be examined in a lab (nerve biopsy). This is rare.  Removing and examining a small amount of the fluid that surrounds the brain and spinal cord (lumbar puncture). This is rare. How is this treated? Treatment for this condition may involve:  Treating the underlying cause of the neuropathy, such as diabetes, kidney disease, or vitamin deficiencies.  Stopping medicines that can cause neuropathy, such as chemotherapy.  Medicine to relieve pain. Medicines may include: ? Prescription or over-the-counter pain medicine. ? Antiseizure medicine. ? Antidepressants. ? Pain-relieving patches that are applied to painful areas of skin.  Surgery to relieve pressure on a nerve or to destroy a  nerve that is causing pain.  Physical therapy to help improve movement and balance.  Devices to help you move around (assistive devices). Follow these instructions at home: Medicines  Take over-the-counter and prescription medicines only as told by your health care provider. Do not take any other medicines without first asking your health care provider.  Do not drive or use heavy machinery while taking prescription pain medicine. Lifestyle   Do not use any products that contain nicotine or tobacco, such as cigarettes and e-cigarettes. Smoking keeps blood from reaching damaged nerves. If you need help quitting, ask your health care provider.  Avoid or limit alcohol. Too much alcohol can cause a vitamin B deficiency, and vitamin B is  needed for healthy nerves.  Eat a healthy diet. This includes: ? Eating foods that are high in fiber, such as fresh fruits and vegetables, whole grains, and beans. ? Limiting foods that are high in fat and processed sugars, such as fried or sweet foods. General instructions   If you have diabetes, work closely with your health care provider to keep your blood sugar under control.  If you have numbness in your feet: ? Check every day for signs of injury or infection. Watch for redness, warmth, and swelling. ? Wear padded socks and comfortable shoes. These help protect your feet.  Develop a good support system. Living with peripheral neuropathy can be stressful. Consider talking with a mental health specialist or joining a support group.  Use assistive devices and attend physical therapy as told by your health care provider. This may include using a walker or a cane.  Keep all follow-up visits as told by your health care provider. This is important. Contact a health care provider if:  You have new signs or symptoms of peripheral neuropathy.  You are struggling emotionally from dealing with peripheral neuropathy.  Your pain is not well-controlled. Get help right away if:  You have an injury or infection that is not healing normally.  You develop new weakness in an arm or leg.  You fall frequently. Summary  Peripheral neuropathy is when the nerves in the arms, or legs are damaged, resulting in numbness, weakness, or pain.  There are many causes of peripheral neuropathy, including diabetes, pinched nerves, vitamin deficiencies, autoimmune disease, and hereditary conditions.  Diagnosing and finding the cause of peripheral neuropathy can be difficult. Your health care provider will take your medical history, do a physical exam, and do tests, including blood tests and nerve function tests.  Treatment involves treating the underlying cause of the neuropathy and taking medicines to  help control pain. Physical therapy and assistive devices may also help. This information is not intended to replace advice given to you by your health care provider. Make sure you discuss any questions you have with your health care provider. Document Revised: 10/11/2017 Document Reviewed: 01/07/2017 Elsevier Patient Education  2020 [employer]. 16.

## 2019-12-25 NOTE — Progress Notes (Signed)
Subjective: Rebecca Baird presents today referred by [place], Betty G, MD for complaint of s/p CVA with hammertoes, callus and painful mycotic nails b/l that are difficult to trim. Pain interferes with ambulation. Aggravating factors include wearing enclosed shoe gear. Pain is relieved with periodic professional debridement.   Patient relates she has just moved to [place] from [place], [place], and is establishing her medical care. She is pleased with the [place] [facility].   She relates previous history of plantar fasciitis, but is not having those symptoms on today. Has history of right ankle fracture. She uses cane or walker at all times.  Past Medical History:  Diagnosis Date  . Anxiety   . Depression   . GERD (gastroesophageal reflux disease)   . Hypertension   . Peripheral neuropathy      Patient Active Problem List   Diagnosis Date Noted  . Unstable gait 10/20/2019  . Dupuytren's contracture of right hand 09/29/2019  . Chronic pain disorder 09/22/2019  . Abnormal LFTs (liver function tests) 09/22/2019  . Peripheral neuropathy 09/22/2019  . History of syncope 09/21/2019  . Benign essential hypertension 09/21/2019  . Bilateral lower extremity edema 09/21/2019  . Alcohol abuse 09/18/2018  . Closed right trimalleolar fracture 09/18/2018  . Depression 09/18/2018  . Fall 09/18/2018  . Syncope 09/18/2018  . Nasal bones, closed fracture 08/07/2018  . Hypercholesteremia 04/04/2016  . Low grade squamous intraepithelial lesion on cytologic smear of cervix (LGSIL) 04/04/2016     Past Surgical History:  Procedure Laterality Date  . broken ankle    . cataract surgery    . NOSE SURGERY       Current Outpatient Medications on File Prior to Visit  Medication Sig Dispense Refill  . Ascorbic Acid (VITAMIN C) 1000 MG tablet Take 1,000 mg by mouth daily.    . citalopram (CELEXA) 10 MG tablet Take 10 mg by mouth daily.    . Cyanocobalamin (VITAMIN B12 PO) Take 5,000 mg by mouth daily.     . folic acid (FOLVITE) 462 MCG tablet Take 400 mcg by mouth daily.    . furosemide (LASIX) 40 MG tablet Take 20 mg by mouth daily.    . metoprolol succinate (TOPROL-XL) 50 MG 24 hr tablet     . Metoprolol Succinate 50 MG CS24 Take 25 mg by mouth daily.    Marland Kitchen omeprazole (PRILOSEC) 20 MG capsule     . Omeprazole 20 MG TBDD Take 20 mg by mouth daily.    . tapentadol (NUCYNTA ER) 50 MG 12 hr tablet Take 50 mg by mouth 2 (two) times daily.    Marland Kitchen thiamine (VITAMIN B-1) 100 MG tablet Take 100 mg by mouth daily.    . valsartan (DIOVAN) 160 MG tablet Take 160 mg by mouth daily.     No current facility-administered medications on file prior to visit.     Allergies  Allergen Reactions  . Epinephrine Other (See Comments) and Palpitations    "Heart Races Uncomfortably"      Social History   Occupational History  . Occupation: retired  Tobacco Use  . Smoking status: Former Smoker    Types: Cigarettes  . Smokeless tobacco: Never Used  Substance and Sexual Activity  . Alcohol use: Yes    Alcohol/week: 3.0 standard drinks    Types: 3 Glasses of wine per week  . Drug use: Not on file  . Sexual activity: Not on file     History reviewed. No pertinent family history.   Immunization  History  Administered Date(s) Administered  . Influenza, High Dose Seasonal PF 07/07/2019  . Influenza,inj,Quad PF,6+ Mos 07/27/2018    Objective: Vitals:   12/22/19 1338  BP: 134/78  Pulse: 99    Vascular Examination:  Capillary refill time to digits immediate b/l, faintly palpable pedal pulses b/l, pedal hair absent b/l and skin temperature gradient within normal limits b/l  Dermatological Examination: Pedal skin with normal turgor, texture and tone bilaterally, no open wounds bilaterally, no interdigital macerations bilaterally, toenails 1-5 b/l elongated, dystrophic, thickened, crumbly with subungual debris and hyperkeratotic lesion(s) submetatarsal head 1 right foot.  No erythema, no edema, no  drainage, no flocculence  Musculoskeletal: Normal muscle strength 5/5 to all lower extremity muscle groups bilaterally, no pain crepitus or joint limitation noted with ROM b/l, pes cavus deformity noted and utilizes cane for ambulation assistance b/l.  Neurological: Protective sensation absent with 10g monofilament b/l and vibratory sensation absent b/l.  Assessment: 1. Pain due to onychomycosis of toenails of both feet   2. Callus   3. Other polyneuropathy   4. Pes cavus, congenital     Plan: -Toenails 1-5 b/l were debrided in length and girth without iatrogenic bleeding. -calluses were debrided without complication or incident. Total number debrided =1 submet head 1 right foot. -Patient to continue soft, supportive shoe gear daily. Recommended shoegear change to [employer] or [employer] with [employer].  -Patient to report any pedal injuries to medical professional immediately. -Patient/POA to call should there be question/concern in the interim.  Return in about 3 months (around 03/20/2020) for nail and callus trim.

## 2020-01-03 ENCOUNTER — Ambulatory Visit: Payer: Medicare PPO | Attending: Internal Medicine

## 2020-01-03 DIAGNOSIS — Z23 Encounter for immunization: Secondary | ICD-10-CM | POA: Insufficient documentation

## 2020-01-03 NOTE — Progress Notes (Signed)
   Covid-19 Vaccination Clinic  Name:  Shantell Belongia    MRN: [id number] DOB: 1946-09-03  01/03/2020  Ms. Luba was observed post Covid-19 immunization for 15 minutes without incidence. She was provided with Vaccine Information Sheet and instruction to access the V-Safe system.   Ms. Shreiner was instructed to call 911 with any severe reactions post vaccine: Marland Kitchen Difficulty breathing  . Swelling of your face and throat  . A fast heartbeat  . A bad rash all over your body  . Dizziness and weakness    Immunizations Administered    Name Date Dose VIS Date Route   Pfizer COVID-19 Vaccine 01/03/2020  3:14 PM 0.3 mL 10/23/2019 Intramuscular   Manufacturer: [employer], [employer]   Lot: [id number]   NDC: [address]-7

## 2020-01-27 ENCOUNTER — Ambulatory Visit: Payer: Medicare PPO | Attending: Internal Medicine

## 2020-01-27 DIAGNOSIS — Z23 Encounter for immunization: Secondary | ICD-10-CM

## 2020-01-27 NOTE — Progress Notes (Signed)
   Covid-19 Vaccination Clinic  Name:  Rebecca Baird    MRN: [id number] DOB: 1946-09-19  01/27/2020  [name] was observed post Covid-19 immunization for 15 minutes without incident. She was provided with Vaccine Information Sheet and instruction to access the V-Safe system.   [name] was instructed to call 911 with any severe reactions post vaccine: Marland Kitchen Difficulty breathing  . Swelling of face and throat  . A fast heartbeat  . A bad rash all over body  . Dizziness and weakness   Immunizations Administered    Name Date Dose VIS Date Route   Pfizer COVID-19 Vaccine 01/27/2020  3:04 PM 0.3 mL 10/23/2019 Intramuscular   Manufacturer: [employer], [employer]   Lot: [id number]   NDC: [address]-8

## 2020-02-19 ENCOUNTER — Encounter: Payer: Self-pay | Admitting: Family Medicine

## 2020-02-19 ENCOUNTER — Ambulatory Visit: Payer: Medicare PPO | Admitting: Family Medicine

## 2020-02-19 ENCOUNTER — Other Ambulatory Visit: Payer: Self-pay

## 2020-02-19 VITALS — BP 120/60 | HR 89 | Temp 97.8°F | Resp 16 | Ht 65.5 in | Wt 161.4 lb

## 2020-02-19 DIAGNOSIS — E7800 Pure hypercholesterolemia, unspecified: Secondary | ICD-10-CM

## 2020-02-19 DIAGNOSIS — R945 Abnormal results of liver function studies: Secondary | ICD-10-CM

## 2020-02-19 DIAGNOSIS — R7989 Other specified abnormal findings of blood chemistry: Secondary | ICD-10-CM

## 2020-02-19 DIAGNOSIS — R600 Localized edema: Secondary | ICD-10-CM

## 2020-02-19 DIAGNOSIS — L219 Seborrheic dermatitis, unspecified: Secondary | ICD-10-CM | POA: Insufficient documentation

## 2020-02-19 DIAGNOSIS — I10 Essential (primary) hypertension: Secondary | ICD-10-CM

## 2020-02-19 DIAGNOSIS — L298 Other pruritus: Secondary | ICD-10-CM

## 2020-02-19 LAB — COMPREHENSIVE METABOLIC PANEL
ALT: 28 U/L (ref 0–35)
AST: 27 U/L (ref 0–37)
Albumin: 4.4 g/dL (ref 3.5–5.2)
Alkaline Phosphatase: 68 U/L (ref 39–117)
BUN: 14 mg/dL (ref 6–23)
CO2: 23 mEq/L (ref 19–32)
Calcium: 9.6 mg/dL (ref 8.4–10.5)
Chloride: 101 mEq/L (ref 96–112)
Creatinine, Ser: 0.94 mg/dL (ref 0.40–1.20)
GFR: 58.25 mL/min — ABNORMAL LOW (ref >60.00)
Glucose, Bld: 106 mg/dL — ABNORMAL HIGH (ref 70–99)
Potassium: 4.2 mEq/L (ref 3.5–5.1)
Sodium: 136 mEq/L (ref 135–145)
Total Bilirubin: 0.8 mg/dL (ref 0.2–1.2)
Total Protein: 6.9 g/dL (ref 6.0–8.3)

## 2020-02-19 LAB — LIPID PANEL
Cholesterol: 338 mg/dL — ABNORMAL HIGH (ref 0–200)
HDL: 53.5 mg/dL (ref >39.00)
LDL Cholesterol: 252 mg/dL — ABNORMAL HIGH (ref 0–99)
NonHDL: 284.64
Total CHOL/HDL Ratio: 6
Triglycerides: 162 mg/dL — ABNORMAL HIGH (ref 0.0–149.0)
VLDL: 32.4 mg/dL (ref 0.0–40.0)

## 2020-02-19 MED ORDER — DESONIDE 0.05 % EX CREA: TOPICAL_CREAM | Freq: Every day | CUTANEOUS | 0 refills | Status: DC | PRN

## 2020-02-19 MED ORDER — FUROSEMIDE 20 MG PO TABS: 20.0000 mg | ORAL_TABLET | Freq: Every day | ORAL | 1 refills | Status: DC

## 2020-02-19 MED ORDER — TRIAMCINOLONE ACETONIDE 0.1 % EX CREA: 1.0000 "application " | TOPICAL_CREAM | Freq: Two times a day (BID) | CUTANEOUS | 1 refills | Status: DC | PRN

## 2020-02-19 MED ORDER — METOPROLOL SUCCINATE ER 25 MG PO TB24: 25.0000 mg | ORAL_TABLET | Freq: Every day | ORAL | 2 refills | Status: DC

## 2020-02-19 NOTE — Progress Notes (Signed)
HPI:   Rebecca Baird is a 74 y.o. female, who is here today for 3-4 months follow up.  She was last seen on 10/20/19. No new problems since her visit.  She is still taking Nucynta ER 50 mg daily for chronic pain. She is not sure if medication helps with fibromyalgia/OA/neuropathi pain She followed with pain management, virtual visit. She would like to establish with provider closer home.  Still has her cain to help with transfer. Her husband helps with bathing.  No falls sine her last OV. Depression: Taking Celexa 10 mg daily. No suicidal thoughts.  HTN: Feeling better after metoprolol dose was adjusted. Negative for severe/frequent headache, visual changes, chest pain, dyspnea, focal weakness, or worsening edema. Last visit Furosemide was also decrease from 40 mg to 20 mg. Dizziness resolved.  Not checking BP now.  HLD: She is on non pharmacologic treatment.  Hx of abnormal LFT's. Drinks about 8 oz of wine daily.  Component     Latest Ref Rng & Units 09/21/2019  Sodium     135 - 145 mEq/L 136  Potassium     3.5 - 5.1 mEq/L 4.1  Chloride     96 - 112 mEq/L 99  CO2     19 - 32 mEq/L 26  Glucose     70 - 99 mg/dL 88  BUN     6 - 23 mg/dL 18  Creatinine     0.17 - 1.20 mg/dL 4.94  Total Bilirubin     0.2 - 1.2 mg/dL 0.9  Alkaline Phosphatase     39 - 117 U/L 69  AST     0 - 37 U/L 31  ALT     0 - 35 U/L 38 (H)  Total Protein     6.0 - 8.3 g/dL 7.1  Albumin     3.5 - 5.2 g/dL 4.6  GFR     >49.67 mL/min 51.32 (L)  Calcium     8.4 - 10.5 mg/dL 59.1    Today she is requesting topical treatment for scaly and mildly pruritic areas on face, around lips mainly. She does not recall name of cream her dermatologist prescribed.  Also noted new onset rash,very pruritic on anterior aspect of thighs. Since she moved to this area, 5-6 months. Applying OTC moisturizer.  It seems better for the past few days. She has not identified exacerbating  or alleviating factors. No tenderness. No new med,detergent,lotion,or soap.   Review of Systems  Constitutional: Positive for fatigue. Negative for activity change, appetite change and fever.  HENT: Negative for mouth sores, nosebleeds and sore throat.   Respiratory: Negative for cough and wheezing.   Gastrointestinal: Negative for abdominal pain, nausea and vomiting.       Negative for changes in bowel habits.  Genitourinary: Negative for decreased urine volume, dysuria and hematuria.  Musculoskeletal: Positive for arthralgias, gait problem and myalgias.  Skin: Negative for wound.  Neurological: Negative for syncope and facial asymmetry.  Psychiatric/Behavioral: Negative for confusion. The patient is nervous/anxious.   Rest of ROS, see pertinent positives sand negatives in HPI  Current Outpatient Medications on File Prior to Visit  Medication Sig Dispense Refill  . Ascorbic Acid (VITAMIN C) 1000 MG tablet Take 1,000 mg by mouth daily.    . citalopram (CELEXA) 10 MG tablet Take 10 mg by mouth daily.    . Cyanocobalamin (VITAMIN B12 PO) Take 5,000 mg by mouth daily.    . folic acid (  FOLVITE) 800 MCG tablet Take 400 mcg by mouth daily.    Marland Kitchen omeprazole (PRILOSEC) 20 MG capsule     . Omeprazole 20 MG TBDD Take 20 mg by mouth daily.    . tapentadol (NUCYNTA ER) 50 MG 12 hr tablet Take 50 mg by mouth 2 (two) times daily.    Marland Kitchen thiamine (VITAMIN B-1) 100 MG tablet Take 100 mg by mouth daily.    . valsartan (DIOVAN) 160 MG tablet Take 160 mg by mouth daily.     No current facility-administered medications on file prior to visit.     Past Medical History:  Diagnosis Date  . Anxiety   . Depression   . GERD (gastroesophageal reflux disease)   . Hypertension   . Peripheral neuropathy    Allergies  Allergen Reactions  . Epinephrine Other (See Comments) and Palpitations    "Heart Races Uncomfortably"     Social History   Socioeconomic History  . Marital status: Married    Spouse  name: Not on file  . Number of children: 1  . Years of education: Not on file  . Highest education level: Associate degree: academic program  Occupational History  . Occupation: retired  Tobacco Use  . Smoking status: Former Smoker    Types: Cigarettes  . Smokeless tobacco: Never Used  Substance and Sexual Activity  . Alcohol use: Yes    Alcohol/week: 3.0 standard drinks    Types: 3 Glasses of wine per week  . Drug use: Not on file  . Sexual activity: Not on file  Other Topics Concern  . Not on file  Social History Narrative   Right handed   One story home   Drinks occasional caffeine   Social Determinants of Health   Financial Resource Strain:   . Difficulty of Paying Living Expenses:   Food Insecurity:   . Worried About Charity fundraiser in the Last Year:   . Arboriculturist in the Last Year:   Transportation Needs:   . Film/video editor (Medical):   Marland Kitchen Lack of Transportation (Non-Medical):   Physical Activity:   . Days of Exercise per Week:   . Minutes of Exercise per Session:   Stress:   . Feeling of Stress :   Social Connections:   . Frequency of Communication with Friends and Family:   . Frequency of Social Gatherings with Friends and Family:   . Attends Religious Services:   . Active Member of Clubs or Organizations:   . Attends Archivist Meetings:   Marland Kitchen Marital Status:     Vitals:   02/19/20 1203  BP: 120/60  Pulse: 89  Resp: 16  Temp: 97.8 F (36.6 C)  SpO2: 97%   Body mass index is 26.45 kg/m.   Physical Exam  Nursing note and vitals reviewed. Constitutional: She is oriented to person, place, and time. She appears well-developed. No distress.  HENT:  Head: Normocephalic and atraumatic.  Mouth/Throat: Oropharynx is clear and moist and mucous membranes are normal.  Eyes: Pupils are equal, round, and reactive to light. Conjunctivae are normal.  Cardiovascular: Normal rate and regular rhythm.  No murmur heard. Pulses:       Dorsalis pedis pulses are 2+ on the right side and 2+ on the left side.  Respiratory: Effort normal and breath sounds normal. No respiratory distress.  GI: Soft. She exhibits no mass. There is no abdominal tenderness.  Musculoskeletal:        General:  No edema.  Lymphadenopathy:    She has no cervical adenopathy.  Neurological: She is alert and oriented to person, place, and time. She has normal strength. No cranial nerve deficit. Gait abnormal.  Skin: Skin is warm. No rash noted. No erythema.  Psychiatric: She has a normal mood and affect.  Well groomed, good eye contact.    ASSESSMENT AND PLAN:  Ms. Rebecca Baird was seen today for chronic disease management.  Orders Placed This Encounter  Procedures  . Lipid panel  . Comprehensive metabolic panel   Lab Results  Component Value Date   CHOL 338 (H) 02/19/2020   HDL 53.50 02/19/2020   LDLCALC 252 (H) 02/19/2020   TRIG 162.0 (H) 02/19/2020   CHOLHDL 6 02/19/2020   Lab Results  Component Value Date   ALT 28 02/19/2020   AST 27 02/19/2020   ALKPHOS 68 02/19/2020   BILITOT 0.8 02/19/2020   Lab Results  Component Value Date   CREATININE 0.94 02/19/2020   BUN 14 02/19/2020   NA 136 02/19/2020   K 4.2 02/19/2020   CL 101 02/19/2020   CO2 23 02/19/2020    Abnormal LFTs (liver function tests) Adverse effects of high alcohol intake discussed. Recommend decreasing alcohol intake.  Benign essential hypertension BP adequately controlled. No changes in current management. Continue low salt diet. Recommend monitoring BP regularly.  Bilateral lower extremity edema Problem is well controlled. Continue Furosemide 20 mg daily. Compression stocking will also help, she may have difficulty putting them on. LE elevation a few times through the day.  Hypercholesteremia Continue low fat diet. Further recommendations will be given according to 10 years CVD risk and lipid numbers.  Seborrheic dermatitis,  unspecified Affecting face. Desonide cream,small amount daily as needed. We discussed side effects of chronic topical steroid use.  Chronic pruritic rash in adult Thighs. ? Eczema. Topical Triamcinolone daily as needed recommended. Continue Cetaphil or Eucerin daily. If problem is not improved,dermatology referral will be considered.   Return in about 5 months (around 07/21/2020) for HTN.   Keven Osborn G. [place], MD  [facility]. [place] [facility].  A few things to remember from today's visit:  No changes in chronic meds today. Topical steroids added today. If rash is not better,please let me know to arrange dermatology evaluation.  This is a list of pain clinics/chronic pain management in the area.  -[facility] [phone].  -[place] [facility] [phone].  -[place] [facility] [phone]  -[place] [facility] [phone].  -[facility] [phone]  -Claria Dice  [phone]  Dr. Claudette Laws [phone]  -Roselie Awkward, MD [phone]  -[facility] [phone]  Petra Kuba, MD [facility] [phone]  Sheran Luz [phone].  Please be sure medication list is accurate. If a new problem present, please set up appointment sooner than planned today.

## 2020-02-19 NOTE — Assessment & Plan Note (Signed)
Adverse effects of high alcohol intake discussed. Recommend decreasing alcohol intake.

## 2020-02-19 NOTE — Patient Instructions (Addendum)
A few things to remember from today's visit:   Benign essential hypertension - Plan: metoprolol succinate (TOPROL-XL) 25 MG 24 hr tablet  Bilateral lower extremity edema - Plan: furosemide (LASIX) 20 MG tablet  Abnormal LFTs (liver function tests)  Seborrheic dermatitis, unspecified - Plan: desonide (DESOWEN) 0.05 % cream  Chronic pruritic rash in adult - Plan: triamcinolone cream (KENALOG) 0.1 %  No changes in chronic meds today. Topical steroids added today. If rash is not better,please let me know to arrange dermatology evaluation.  This is a list of pain clinics/chronic pain management in the area.  -[facility] [phone].  -[place] [facility] [phone].  -[place] [facility] [phone]  -[place] [facility] [phone].  -[facility] [phone]  -Claria Dice  [phone]  Dr. Claudette Laws [phone]  -Roselie Awkward, MD [phone]  -[facility] [phone]  Petra Kuba, MD [facility] [phone]  Sheran Luz [phone].  Please be sure medication list is accurate. If a new problem present, please set up appointment sooner than planned today.

## 2020-02-19 NOTE — Assessment & Plan Note (Signed)
Continue low fat diet. Further recommendations will be given according to 10 years CVD risk and lipid numbers.

## 2020-02-19 NOTE — Assessment & Plan Note (Signed)
BP adequately controlled. No changes in current management. Continue low-salt diet. Recommend monitoring BP regularly. 

## 2020-02-19 NOTE — Assessment & Plan Note (Signed)
Affecting face. Desonide cream,small amount daily as needed. We discussed side effects of chronic topical steroid use.

## 2020-02-19 NOTE — Assessment & Plan Note (Signed)
Problem is well controlled. Continue Furosemide 20 mg daily. Compression stocking will also help, she may have difficulty putting them on. LE elevation a few times through the day.

## 2020-02-22 ENCOUNTER — Other Ambulatory Visit: Payer: Self-pay | Admitting: *Deleted

## 2020-02-22 MED ORDER — ROSUVASTATIN CALCIUM 10 MG PO TABS: 10.0000 mg | ORAL_TABLET | Freq: Every day | ORAL | 3 refills | Status: DC

## 2020-04-05 ENCOUNTER — Ambulatory Visit: Payer: Medicare PPO | Admitting: Podiatry

## 2020-04-05 ENCOUNTER — Other Ambulatory Visit: Payer: Self-pay

## 2020-04-05 DIAGNOSIS — L84 Corns and callosities: Secondary | ICD-10-CM | POA: Diagnosis not present

## 2020-04-05 DIAGNOSIS — B351 Tinea unguium: Secondary | ICD-10-CM

## 2020-04-05 DIAGNOSIS — G6289 Other specified polyneuropathies: Secondary | ICD-10-CM | POA: Diagnosis not present

## 2020-04-05 DIAGNOSIS — M79675 Pain in left toe(s): Secondary | ICD-10-CM | POA: Diagnosis not present

## 2020-04-05 DIAGNOSIS — M79674 Pain in right toe(s): Secondary | ICD-10-CM

## 2020-04-05 NOTE — Patient Instructions (Signed)

## 2020-04-11 ENCOUNTER — Encounter: Payer: Self-pay | Admitting: Podiatry

## 2020-04-11 NOTE — Progress Notes (Signed)
Subjective: Rebecca Baird is a 74 y.o. female patient seen today at risk foot care with history of peripheral neuropathy and painful callus(es) right foot and painful mycotic toenails b/l that are difficult to trim. Pain interferes with ambulation. Aggravating factors include wearing enclosed shoe gear. Pain is relieved with periodic professional debridement.   She voices no new pedal concerns on today's visit.  Patient Active Problem List   Diagnosis Date Noted  . Seborrheic dermatitis, unspecified 02/19/2020  . Unstable gait 10/20/2019  . Dupuytren's contracture of right hand 09/29/2019  . Chronic pain disorder 09/22/2019  . Abnormal LFTs (liver function tests) 09/22/2019  . Peripheral neuropathy 09/22/2019  . History of syncope 09/21/2019  . Benign essential hypertension 09/21/2019  . Bilateral lower extremity edema 09/21/2019  . Alcohol abuse 09/18/2018  . Closed right trimalleolar fracture 09/18/2018  . Depression 09/18/2018  . Fall 09/18/2018  . Syncope 09/18/2018  . Nasal bones, closed fracture 08/07/2018  . Hypercholesteremia 04/04/2016  . Low grade squamous intraepithelial lesion on cytologic smear of cervix (LGSIL) 04/04/2016    Current Outpatient Medications on File Prior to Visit  Medication Sig Dispense Refill  . Ascorbic Acid (VITAMIN C) 1000 MG tablet Take 1,000 mg by mouth daily.    . citalopram (CELEXA) 10 MG tablet Take 10 mg by mouth daily.    . Cyanocobalamin (VITAMIN B12 PO) Take 5,000 mg by mouth daily.    Marland Kitchen desonide (DESOWEN) 0.05 % cream Apply topically daily as needed. Face 30 g 0  . folic acid (FOLVITE) 681 MCG tablet Take 400 mcg by mouth daily.    . furosemide (LASIX) 20 MG tablet Take 1 tablet (20 mg total) by mouth daily. 90 tablet 1  . metoprolol succinate (TOPROL-XL) 25 MG 24 hr tablet Take 1 tablet (25 mg total) by mouth daily. 90 tablet 2  . omeprazole (PRILOSEC) 20 MG capsule     . Omeprazole 20 MG TBDD Take 20 mg by mouth daily.    .  rosuvastatin (CRESTOR) 10 MG tablet Take 1 tablet (10 mg total) by mouth daily. 90 tablet 3  . tapentadol (NUCYNTA ER) 50 MG 12 hr tablet Take 50 mg by mouth 2 (two) times daily.    Marland Kitchen thiamine (VITAMIN B-1) 100 MG tablet Take 100 mg by mouth daily.    Marland Kitchen triamcinolone cream (KENALOG) 0.1 % Apply 1 application topically 2 (two) times daily as needed. Legs 45 g 1  . valsartan (DIOVAN) 160 MG tablet Take 160 mg by mouth daily.     No current facility-administered medications on file prior to visit.    Allergies  Allergen Reactions  . Epinephrine Other (See Comments) and Palpitations    "Heart Races Uncomfortably"     Objective: Physical Exam  General: Rebecca Baird is a pleasant 74 y.o.  Caucasian female, in NAD. AAO x 3.   Vascular:  Neurovascular status unchanged b/l LE. Capillary refill time to digits immediate b/l. Faintly palpable pedal pulses b/l. Pedal hair absent b/l Skin temperature gradient within normal limits b/l.  Dermatological:  Pedal skin with normal turgor, texture and tone bilaterally. No open wounds bilaterally. No interdigital macerations bilaterally. Toenails 1-5 b/l elongated, discolored, dystrophic, thickened, crumbly with subungual debris and tenderness to dorsal palpation. Hyperkeratotic lesion(s) submet head 1 right foot.  No erythema, no edema, no drainage, no flocculence.  Musculoskeletal:  Normal muscle strength 5/5 to all lower extremity muscle groups bilaterally. No pain crepitus or joint limitation noted with ROM b/l. Pes cavus  deformity noted b/l.  Utilizes cane for ambulation assistance.  Neurological:  Protective sensation intact 5/5 intact bilaterally with 10g monofilament b/l. Vibratory sensation intact b/l.  Assessment and Plan:  1. Pain due to onychomycosis of toenails of both feet   2. Callus   3. Other polyneuropathy    -Examined patient. -Toenails 1-5 b/l were debrided in length and girth with sterile nail nippers and dremel  without iatrogenic bleeding.  -Callus(es) submet head 1 right foot pared utilizing sterile scalpel blade without complication or incident. Total number debrided =1. -Patient to continue soft, supportive shoe gear daily. -Patient to report any pedal injuries to medical professional immediately. -Patient/POA to call should there be question/concern in the interim.  Return in about 3 months (around 07/06/2020) for nail and callus trim.  Freddie Breech, DPM

## 2020-05-03 DIAGNOSIS — G8929 Other chronic pain: Secondary | ICD-10-CM | POA: Diagnosis not present

## 2020-05-03 DIAGNOSIS — M5416 Radiculopathy, lumbar region: Secondary | ICD-10-CM | POA: Diagnosis not present

## 2020-06-17 ENCOUNTER — Other Ambulatory Visit: Payer: Self-pay | Admitting: Family Medicine

## 2020-06-30 DIAGNOSIS — G9009 Other idiopathic peripheral autonomic neuropathy: Secondary | ICD-10-CM | POA: Diagnosis not present

## 2020-06-30 DIAGNOSIS — G8929 Other chronic pain: Secondary | ICD-10-CM | POA: Diagnosis not present

## 2020-06-30 DIAGNOSIS — M5416 Radiculopathy, lumbar region: Secondary | ICD-10-CM | POA: Diagnosis not present

## 2020-06-30 DIAGNOSIS — G621 Alcoholic polyneuropathy: Secondary | ICD-10-CM | POA: Diagnosis not present

## 2020-07-05 ENCOUNTER — Encounter: Payer: Self-pay | Admitting: Podiatry

## 2020-07-05 ENCOUNTER — Ambulatory Visit: Payer: Medicare PPO | Admitting: Podiatry

## 2020-07-05 ENCOUNTER — Other Ambulatory Visit: Payer: Self-pay

## 2020-07-05 DIAGNOSIS — M79675 Pain in left toe(s): Secondary | ICD-10-CM

## 2020-07-05 DIAGNOSIS — B351 Tinea unguium: Secondary | ICD-10-CM | POA: Diagnosis not present

## 2020-07-05 DIAGNOSIS — G6289 Other specified polyneuropathies: Secondary | ICD-10-CM

## 2020-07-05 DIAGNOSIS — L84 Corns and callosities: Secondary | ICD-10-CM | POA: Diagnosis not present

## 2020-07-05 DIAGNOSIS — M79674 Pain in right toe(s): Secondary | ICD-10-CM

## 2020-07-07 NOTE — Progress Notes (Signed)
Subjective: Rebecca Baird is a 74 y.o. female patient seen today at risk foot care with history of peripheral neuropathy and painful callus(es) right foot and painful mycotic toenails b/l that are difficult to trim. Pain interferes with ambulation. Aggravating factors include wearing enclosed shoe gear. Pain is relieved with periodic professional debridement.   She voices no new pedal concerns on today's visit.  Patient Active Problem List   Diagnosis Date Noted  . Seborrheic dermatitis, unspecified 02/19/2020  . Unstable gait 10/20/2019  . Dupuytren's contracture of right hand 09/29/2019  . Chronic pain disorder 09/22/2019  . Abnormal LFTs (liver function tests) 09/22/2019  . Peripheral neuropathy 09/22/2019  . History of syncope 09/21/2019  . Benign essential hypertension 09/21/2019  . Bilateral lower extremity edema 09/21/2019  . Alcohol abuse 09/18/2018  . Closed right trimalleolar fracture 09/18/2018  . Depression 09/18/2018  . Fall 09/18/2018  . Syncope 09/18/2018  . Nasal bones, closed fracture 08/07/2018  . Hypercholesteremia 04/04/2016  . Low grade squamous intraepithelial lesion on cytologic smear of cervix (LGSIL) 04/04/2016    Current Outpatient Medications on File Prior to Visit  Medication Sig Dispense Refill  . valsartan (DIOVAN) 160 MG tablet TAKE 1 TABLET BY MOUTH EVERY DAY 90 tablet 1  . Ascorbic Acid (VITAMIN C) 1000 MG tablet Take 1,000 mg by mouth daily.    . citalopram (CELEXA) 10 MG tablet Take 10 mg by mouth daily.    . Cyanocobalamin (VITAMIN B12 PO) Take 5,000 mg by mouth daily.    Marland Kitchen desonide (DESOWEN) 0.05 % cream Apply topically daily as needed. Face 30 g 0  . folic acid (FOLVITE) 800 MCG tablet Take 400 mcg by mouth daily.    . furosemide (LASIX) 20 MG tablet Take 1 tablet (20 mg total) by mouth daily. 90 tablet 1  . indomethacin (INDOCIN) 50 MG capsule     . metoprolol succinate (TOPROL-XL) 25 MG 24 hr tablet Take 1 tablet (25 mg total) by  mouth daily. 90 tablet 2  . omeprazole (PRILOSEC) 20 MG capsule     . Omeprazole 20 MG TBDD Take 20 mg by mouth daily.    . rosuvastatin (CRESTOR) 10 MG tablet Take 1 tablet (10 mg total) by mouth daily. 90 tablet 3  . tapentadol (NUCYNTA ER) 50 MG 12 hr tablet Take 50 mg by mouth 2 (two) times daily.    Marland Kitchen thiamine (VITAMIN B-1) 100 MG tablet Take 100 mg by mouth daily.    Marland Kitchen triamcinolone cream (KENALOG) 0.1 % Apply 1 application topically 2 (two) times daily as needed. Legs 45 g 1   No current facility-administered medications on file prior to visit.    Allergies  Allergen Reactions  . Epinephrine Other (See Comments) and Palpitations    "Heart Races Uncomfortably"     Objective: Physical Exam  General: Rebecca Baird is a pleasant 74 y.o.  Caucasian female, in NAD. AAO x 3.   Vascular:  Neurovascular status unchanged b/l LE. Capillary refill time to digits immediate b/l. Faintly palpable pedal pulses b/l. Pedal hair absent b/l Skin temperature gradient within normal limits b/l.  Dermatological:  Pedal skin with normal turgor, texture and tone bilaterally. No open wounds bilaterally. No interdigital macerations bilaterally. Toenails 1-5 b/l elongated, discolored, dystrophic, thickened, crumbly with subungual debris and tenderness to dorsal palpation. Hyperkeratotic lesion(s) submet head 1 right foot.  No erythema, no edema, no drainage, no flocculence.  Musculoskeletal:  Normal muscle strength 5/5 to all lower extremity muscle groups bilaterally.  No pain crepitus or joint limitation noted with ROM b/l. Pes cavus deformity noted b/l.  Utilizes cane for ambulation assistance.  Neurological:  Protective sensation intact 5/5 intact bilaterally with 10g monofilament b/l. Vibratory sensation intact b/l.  Assessment and Plan:  1. Pain due to onychomycosis of toenails of both feet   2. Callus   3. Other polyneuropathy    -Examined patient. -Toenails 1-5 b/l were debrided in  length and girth with sterile nail nippers and dremel without iatrogenic bleeding.  -Callus(es) submet head 1 right foot pared utilizing sterile scalpel blade without complication or incident. Total number debrided =1. -Patient to continue soft, supportive shoe gear daily. -Patient to report any pedal injuries to medical professional immediately. -Patient/POA to call should there be question/concern in the interim.  Return in about 3 months (around 10/05/2020) for nail and callus trim.  Freddie Breech, DPM

## 2020-07-25 ENCOUNTER — Ambulatory Visit: Payer: Medicare PPO | Admitting: Family Medicine

## 2020-08-03 ENCOUNTER — Ambulatory Visit (INDEPENDENT_AMBULATORY_CARE_PROVIDER_SITE_OTHER): Payer: Medicare PPO | Admitting: Family Medicine

## 2020-08-03 ENCOUNTER — Encounter: Payer: Self-pay | Admitting: Family Medicine

## 2020-08-03 ENCOUNTER — Other Ambulatory Visit: Payer: Self-pay

## 2020-08-03 VITALS — BP 130/80 | HR 99 | Resp 16 | Ht 65.5 in | Wt 162.5 lb

## 2020-08-03 DIAGNOSIS — R945 Abnormal results of liver function studies: Secondary | ICD-10-CM | POA: Diagnosis not present

## 2020-08-03 DIAGNOSIS — M545 Low back pain, unspecified: Secondary | ICD-10-CM

## 2020-08-03 DIAGNOSIS — I10 Essential (primary) hypertension: Secondary | ICD-10-CM | POA: Diagnosis not present

## 2020-08-03 DIAGNOSIS — Z23 Encounter for immunization: Secondary | ICD-10-CM

## 2020-08-03 DIAGNOSIS — L2989 Other pruritus: Secondary | ICD-10-CM

## 2020-08-03 DIAGNOSIS — K5900 Constipation, unspecified: Secondary | ICD-10-CM

## 2020-08-03 DIAGNOSIS — G8929 Other chronic pain: Secondary | ICD-10-CM

## 2020-08-03 DIAGNOSIS — R7989 Other specified abnormal findings of blood chemistry: Secondary | ICD-10-CM

## 2020-08-03 DIAGNOSIS — L298 Other pruritus: Secondary | ICD-10-CM | POA: Diagnosis not present

## 2020-08-03 DIAGNOSIS — L219 Seborrheic dermatitis, unspecified: Secondary | ICD-10-CM

## 2020-08-03 NOTE — Patient Instructions (Addendum)
A few things to remember from today's visit:   Benign essential hypertension - Plan: COMPLETE METABOLIC PANEL WITH GFR  Abnormal LFTs (liver function tests) - Plan: COMPLETE METABOLIC PANEL WITH GFR  Chronic pruritic rash in adult  Chronic low back pain without sciatica, unspecified back pain laterality - Plan: Ambulatory referral to Chiropractic  Caution with alcohol intake. Appt with chiropractor will be arranged.  Try decreasing celexa 1 and 1/2 tab alternating. Miralax daily  And continue adequate hydration and fiber intake. Opioid meds can aggravate constipation.  If you need refills please call your pharmacy. Do not use My Chart to request refills or for acute issues that need immediate attention.    Please be sure medication list is accurate. If a new problem present, please set up appointment sooner than planned today.

## 2020-08-03 NOTE — Progress Notes (Signed)
HPI: [name] is a 74 y.o. female, who is here today for 6 months follow up.   She was last seen on 02/19/20. No new problems sine her last visit. She has followed with pain management and dose of Nucynta was increased from 50 mg to 100 mg daily. Lower back pain and peripheral neuropathy seem to be getting worse. Pain limits ADL's.  Her husband helps her with bathing and dressing. She has a cane to help with transfer.  She would like a referral to see chiropractor, it was placed last year , she received phone call but had to cancelled appt. Chiropractor has helped with back pain in the past.  Today she has a few questions, some in regard to COVID 19 vaccination.  Skin pruritus:Problem attributed to dry skin. Triamcinolone has helped. Scaly areas on face,prurits,intermittent. Seborrheic dermatitis, topical desonide has helped.  She has not noted erythematous rash. + Easy bruising.  HTN: She is on Metoprolol succinate 25 mg daily and Diovan 160 mg daily. She is not check BP at home. Negative for severe/frequent headache, visual changes, chest pain, dyspnea, palpitation,or worsening edema.  Component     Latest Ref Rng & Units 02/19/2020  Sodium     135 - 146 mmol/L 136  Potassium     3.5 - 5.3 mmol/L 4.2  Chloride     98 - 110 mmol/L 101  CO2     20 - 32 mmol/L 23  Glucose     65 - 99 mg/dL 161106 (H)  BUN     7 - 25 mg/dL 14  Creatinine     0.960.60 - 0.93 mg/dL 0.450.94   Left ear: When cleaning ears with Q tip, she thinks a piece of cotton stayed in left ear canal. Negative for ear drainage or hearing changes but some discomfort.  She is having urine leakage and urgency. Getting worse since her last visit. No dysuria, gross hematuria,or pelvic pain.  Constipation last week, did not have a bowel movement x 4 days. Increase fiber and water intake. Improved and now stool is not formed. Periumbilical cramps that improved with defecation or passing gas. No  melena or red blood in stool.  She takes a stool softener, having daily stool.  Depression and anxiety: She is on Celexa 10 mg daily, initially prescribed for depression after her husband died, when she was about 74 yo. She does not feel depressed now, mildly anxious.  She has had trouble falling asleep,this happens occasionally. Yesterday she slept well.  Abnormal LFT's: Negative for abdominal pain,N/V,or jaundice. She is drinking more since COVID 19 pandemia started, around 3 glasses of wine. She has not been able to to to going to church. When pandemia started she had just move to the area,so has no friends. She lives with her husband.  Component     Latest Ref Rng & Units 02/19/2020  Creatinine     0.60 - 0.93 mg/dL 4.090.94  Total Bilirubin     0.2 - 1.2 mg/dL 0.8  Alkaline Phosphatase     39 - 117 U/L 68  AST     10 - 35 U/L 27  ALT     6 - 29 U/L 28  Total Protein     6.1 - 8.1 g/dL 6.9  Albumin     3.5 - 5.2 g/dL 4.4  GFR     >81.19>60.00 mL/min 58.25 (L)  Calcium     8.6 - 10.4 mg/dL 9.6  Review of Systems  Constitutional: Positive for fatigue. Negative for activity change, appetite change and fever.  HENT: Negative for mouth sores, nosebleeds and sore throat.   Respiratory: Negative for cough and wheezing.   Gastrointestinal:       Negative for changes in bowel habits.  Genitourinary: Negative for decreased urine volume and difficulty urinating.  Musculoskeletal: Positive for arthralgias, back pain and gait problem.  Neurological: Positive for numbness. Negative for syncope and facial asymmetry.  Psychiatric/Behavioral: Negative for confusion.  Rest of ROS, see pertinent positives sand negatives in HPI  Current Outpatient Medications on File Prior to Visit  Medication Sig Dispense Refill  . Ascorbic Acid (VITAMIN C) 1000 MG tablet Take 1,000 mg by mouth daily.    . citalopram (CELEXA) 10 MG tablet Take 10 mg by mouth daily.    . Cyanocobalamin (VITAMIN B12 PO) Take  5,000 mg by mouth daily.    Marland Kitchen desonide (DESOWEN) 0.05 % cream Apply topically daily as needed. Face 30 g 0  . folic acid (FOLVITE) 800 MCG tablet Take 400 mcg by mouth daily.    . indomethacin (INDOCIN) 50 MG capsule     . metoprolol succinate (TOPROL-XL) 25 MG 24 hr tablet Take 1 tablet (25 mg total) by mouth daily. 90 tablet 2  . omeprazole (PRILOSEC) 20 MG capsule     . rosuvastatin (CRESTOR) 10 MG tablet Take 1 tablet (10 mg total) by mouth daily. 90 tablet 3  . Tapentadol HCl 100 MG TABS Take 100 mg by mouth daily as needed.    . thiamine (VITAMIN B-1) 100 MG tablet Take 100 mg by mouth daily.    Marland Kitchen triamcinolone cream (KENALOG) 0.1 % Apply 1 application topically 2 (two) times daily as needed. Legs 45 g 1  . valsartan (DIOVAN) 160 MG tablet TAKE 1 TABLET BY MOUTH EVERY DAY 90 tablet 1   No current facility-administered medications on file prior to visit.   Past Medical History:  Diagnosis Date  . Anxiety   . Depression   . GERD (gastroesophageal reflux disease)   . Hypertension   . Peripheral neuropathy    Allergies  Allergen Reactions  . Epinephrine Other (See Comments) and Palpitations    "Heart Races Uncomfortably"    Social History   Socioeconomic History  . Marital status: Married    Spouse name: Not on file  . Number of children: 1  . Years of education: Not on file  . Highest education level: Associate degree: academic program  Occupational History  . Occupation: retired  Tobacco Use  . Smoking status: Former Smoker    Types: Cigarettes  . Smokeless tobacco: Never Used  Vaping Use  . Vaping Use: Never used  Substance and Sexual Activity  . Alcohol use: Yes    Alcohol/week: 3.0 standard drinks    Types: 3 Glasses of wine per week  . Drug use: Not on file  . Sexual activity: Not on file  Other Topics Concern  . Not on file  Social History Narrative   Right handed   One story home   Drinks occasional caffeine   Social Determinants of Health    Financial Resource Strain:   . Difficulty of Paying Living Expenses: Not on file  Food Insecurity:   . Worried About Programme researcher, broadcasting/film/video in the Last Year: Not on file  . Ran Out of Food in the Last Year: Not on file  Transportation Needs:   . Lack of Transportation (Medical): Not on  file  . Lack of Transportation (Non-Medical): Not on file  Physical Activity:   . Days of Exercise per Week: Not on file  . Minutes of Exercise per Session: Not on file  Stress:   . Feeling of Stress : Not on file  Social Connections:   . Frequency of Communication with Friends and Family: Not on file  . Frequency of Social Gatherings with Friends and Family: Not on file  . Attends Religious Services: Not on file  . Active Member of Clubs or Organizations: Not on file  . Attends Banker Meetings: Not on file  . Marital Status: Not on file   Vitals:   08/03/20 1521  BP: 130/80  Pulse: 99  Resp: 16  SpO2: 92%   Body mass index is 26.63 kg/m.  Physical Exam Vitals and nursing note reviewed.  Constitutional:      General: She is not in acute distress.    Appearance: She is well-developed.  HENT:     Head: Normocephalic and atraumatic.     Mouth/Throat:     Mouth: Mucous membranes are moist.     Pharynx: Oropharynx is clear.  Eyes:     Conjunctiva/sclera: Conjunctivae normal.     Pupils: Pupils are equal, round, and reactive to light.  Cardiovascular:     Rate and Rhythm: Normal rate and regular rhythm.     Pulses:          Dorsalis pedis pulses are 2+ on the right side and 2+ on the left side.     Heart sounds: Murmur (Soft SEM LUSB) heard.   Pulmonary:     Effort: Pulmonary effort is normal. No respiratory distress.     Breath sounds: Normal breath sounds.  Abdominal:     Palpations: Abdomen is soft. There is no hepatomegaly or mass.     Tenderness: There is no abdominal tenderness.  Lymphadenopathy:     Cervical: No cervical adenopathy.  Skin:    General: Skin is  warm.     Findings: Rash is not papular or pustular.     Comments: Dry fine scaly areas of skin. No erythematous rash.  Neurological:     Mental Status: She is alert and oriented to person, place, and time.     Sensory: Sensory deficit present.     Gait: Gait abnormal.     Comments: She uses a cane, unstable gait. Feet perception/coordination abnormal.  Psychiatric:        Mood and Affect: Affect normal. Mood is anxious.     Comments: Well groomed, good eye contact.    ASSESSMENT AND PLAN:  Ms. Reine Bristow was seen today for 6 months follow-up.  Orders Placed This Encounter  Procedures  . Flu Vaccine QUAD High Dose(Fluad)  . COMPLETE METABOLIC PANEL WITH GFR  . Ambulatory referral to Chiropractic   Lab Results  Component Value Date   CREATININE 0.88 08/03/2020   BUN 10 08/03/2020   NA 141 08/03/2020   K 4.3 08/03/2020   CL 103 08/03/2020   CO2 30 08/03/2020   Lab Results  Component Value Date   ALT 13 08/03/2020   AST 21 08/03/2020   ALKPHOS 68 02/19/2020   BILITOT 0.4 08/03/2020    Constipation, unspecified constipation type Most likely worse due to increase dose of nucynta dose. Continue adequate fiber and fluid intake.  Benign essential hypertension BP adequately controlled. No changes in current management. Monitor BP. Continue low salt diet.  Abnormal LFTs (liver  function tests) We discussed adverse effects of high alcohol intake, recommend decreasing amount. Further recommendations according to lab results.  Chronic pruritic rash in adult Adequate hydration, daily moisturizer. Topica steroid to continue, small amount at the time.  Seborrheic dermatitis, unspecified Desonide small amount on affected areas of face daily as needed. Some side effects of chronic topica steroids discussed.  Chronic low back pain without sciatica, unspecified back pain laterality On chronic opioid treatment. Continue following with pain management. Fall  precautions discussed. Chiropractor referral placed.  Need for influenza vaccination -     Flu Vaccine QUAD High Dose(Fluad)  Return in about 6 months (around 01/31/2021).   Vishruth Seoane G. [place], MD  [facility]. [place] [facility].  Spent 45 minutes with pt.  During this time history was obtained and documented, examination was performed, prior labs reviewed, and assessment/plan discussed.  Return in about 6 months (around 01/31/2021).   Arpita Fentress G. [place], MD  [facility]. [place] [facility].

## 2020-08-04 LAB — COMPLETE METABOLIC PANEL WITH GFR
AG Ratio: 1.4 (calc) (ref 1.0–2.5)
ALT: 13 U/L (ref 6–29)
AST: 21 U/L (ref 10–35)
Albumin: 4.4 g/dL (ref 3.6–5.1)
Alkaline phosphatase (APISO): 58 U/L (ref 37–153)
BUN: 10 mg/dL (ref 7–25)
CO2: 30 mmol/L (ref 20–32)
Calcium: 9.9 mg/dL (ref 8.6–10.4)
Chloride: 103 mmol/L (ref 98–110)
Creat: 0.88 mg/dL (ref 0.60–0.93)
GFR, Est African American: 75 mL/min/{1.73_m2} (ref >=60)
GFR, Est Non African American: 65 mL/min/{1.73_m2} (ref >=60)
Globulin: 3.1 g/dL (calc) (ref 1.9–3.7)
Glucose, Bld: 104 mg/dL — ABNORMAL HIGH (ref 65–99)
Potassium: 4.3 mmol/L (ref 3.5–5.3)
Sodium: 141 mmol/L (ref 135–146)
Total Bilirubin: 0.4 mg/dL (ref 0.2–1.2)
Total Protein: 7.5 g/dL (ref 6.1–8.1)

## 2020-08-05 ENCOUNTER — Other Ambulatory Visit: Payer: Self-pay | Admitting: Family Medicine

## 2020-08-05 DIAGNOSIS — R600 Localized edema: Secondary | ICD-10-CM

## 2020-08-09 DIAGNOSIS — M5137 Other intervertebral disc degeneration, lumbosacral region: Secondary | ICD-10-CM | POA: Diagnosis not present

## 2020-08-09 DIAGNOSIS — M5135 Other intervertebral disc degeneration, thoracolumbar region: Secondary | ICD-10-CM | POA: Diagnosis not present

## 2020-08-09 DIAGNOSIS — M9902 Segmental and somatic dysfunction of thoracic region: Secondary | ICD-10-CM | POA: Diagnosis not present

## 2020-08-09 DIAGNOSIS — M9903 Segmental and somatic dysfunction of lumbar region: Secondary | ICD-10-CM | POA: Diagnosis not present

## 2020-08-09 DIAGNOSIS — M9904 Segmental and somatic dysfunction of sacral region: Secondary | ICD-10-CM | POA: Diagnosis not present

## 2020-08-09 DIAGNOSIS — M5136 Other intervertebral disc degeneration, lumbar region: Secondary | ICD-10-CM | POA: Diagnosis not present

## 2020-08-10 DIAGNOSIS — M5137 Other intervertebral disc degeneration, lumbosacral region: Secondary | ICD-10-CM | POA: Diagnosis not present

## 2020-08-10 DIAGNOSIS — M9903 Segmental and somatic dysfunction of lumbar region: Secondary | ICD-10-CM | POA: Diagnosis not present

## 2020-08-10 DIAGNOSIS — M5135 Other intervertebral disc degeneration, thoracolumbar region: Secondary | ICD-10-CM | POA: Diagnosis not present

## 2020-08-10 DIAGNOSIS — M9904 Segmental and somatic dysfunction of sacral region: Secondary | ICD-10-CM | POA: Diagnosis not present

## 2020-08-10 DIAGNOSIS — M5136 Other intervertebral disc degeneration, lumbar region: Secondary | ICD-10-CM | POA: Diagnosis not present

## 2020-08-10 DIAGNOSIS — M9902 Segmental and somatic dysfunction of thoracic region: Secondary | ICD-10-CM | POA: Diagnosis not present

## 2020-08-16 DIAGNOSIS — M9902 Segmental and somatic dysfunction of thoracic region: Secondary | ICD-10-CM | POA: Diagnosis not present

## 2020-08-16 DIAGNOSIS — M9903 Segmental and somatic dysfunction of lumbar region: Secondary | ICD-10-CM | POA: Diagnosis not present

## 2020-08-16 DIAGNOSIS — M9904 Segmental and somatic dysfunction of sacral region: Secondary | ICD-10-CM | POA: Diagnosis not present

## 2020-08-16 DIAGNOSIS — M5137 Other intervertebral disc degeneration, lumbosacral region: Secondary | ICD-10-CM | POA: Diagnosis not present

## 2020-08-16 DIAGNOSIS — M5136 Other intervertebral disc degeneration, lumbar region: Secondary | ICD-10-CM | POA: Diagnosis not present

## 2020-08-16 DIAGNOSIS — M5135 Other intervertebral disc degeneration, thoracolumbar region: Secondary | ICD-10-CM | POA: Diagnosis not present

## 2020-08-17 DIAGNOSIS — M9902 Segmental and somatic dysfunction of thoracic region: Secondary | ICD-10-CM | POA: Diagnosis not present

## 2020-08-17 DIAGNOSIS — M5137 Other intervertebral disc degeneration, lumbosacral region: Secondary | ICD-10-CM | POA: Diagnosis not present

## 2020-08-17 DIAGNOSIS — M9903 Segmental and somatic dysfunction of lumbar region: Secondary | ICD-10-CM | POA: Diagnosis not present

## 2020-08-17 DIAGNOSIS — M5135 Other intervertebral disc degeneration, thoracolumbar region: Secondary | ICD-10-CM | POA: Diagnosis not present

## 2020-08-17 DIAGNOSIS — M9904 Segmental and somatic dysfunction of sacral region: Secondary | ICD-10-CM | POA: Diagnosis not present

## 2020-08-17 DIAGNOSIS — M5136 Other intervertebral disc degeneration, lumbar region: Secondary | ICD-10-CM | POA: Diagnosis not present

## 2020-08-19 DIAGNOSIS — M9903 Segmental and somatic dysfunction of lumbar region: Secondary | ICD-10-CM | POA: Diagnosis not present

## 2020-08-19 DIAGNOSIS — M9902 Segmental and somatic dysfunction of thoracic region: Secondary | ICD-10-CM | POA: Diagnosis not present

## 2020-08-19 DIAGNOSIS — M5135 Other intervertebral disc degeneration, thoracolumbar region: Secondary | ICD-10-CM | POA: Diagnosis not present

## 2020-08-19 DIAGNOSIS — M5137 Other intervertebral disc degeneration, lumbosacral region: Secondary | ICD-10-CM | POA: Diagnosis not present

## 2020-08-19 DIAGNOSIS — M5136 Other intervertebral disc degeneration, lumbar region: Secondary | ICD-10-CM | POA: Diagnosis not present

## 2020-08-19 DIAGNOSIS — M9904 Segmental and somatic dysfunction of sacral region: Secondary | ICD-10-CM | POA: Diagnosis not present

## 2020-08-23 DIAGNOSIS — M5137 Other intervertebral disc degeneration, lumbosacral region: Secondary | ICD-10-CM | POA: Diagnosis not present

## 2020-08-23 DIAGNOSIS — M9904 Segmental and somatic dysfunction of sacral region: Secondary | ICD-10-CM | POA: Diagnosis not present

## 2020-08-23 DIAGNOSIS — M5135 Other intervertebral disc degeneration, thoracolumbar region: Secondary | ICD-10-CM | POA: Diagnosis not present

## 2020-08-23 DIAGNOSIS — M9903 Segmental and somatic dysfunction of lumbar region: Secondary | ICD-10-CM | POA: Diagnosis not present

## 2020-08-23 DIAGNOSIS — M9902 Segmental and somatic dysfunction of thoracic region: Secondary | ICD-10-CM | POA: Diagnosis not present

## 2020-08-23 DIAGNOSIS — M5136 Other intervertebral disc degeneration, lumbar region: Secondary | ICD-10-CM | POA: Diagnosis not present

## 2020-08-25 DIAGNOSIS — G8929 Other chronic pain: Secondary | ICD-10-CM | POA: Diagnosis not present

## 2020-08-25 DIAGNOSIS — M5416 Radiculopathy, lumbar region: Secondary | ICD-10-CM | POA: Diagnosis not present

## 2020-08-26 DIAGNOSIS — M9904 Segmental and somatic dysfunction of sacral region: Secondary | ICD-10-CM | POA: Diagnosis not present

## 2020-08-26 DIAGNOSIS — M5135 Other intervertebral disc degeneration, thoracolumbar region: Secondary | ICD-10-CM | POA: Diagnosis not present

## 2020-08-26 DIAGNOSIS — M9903 Segmental and somatic dysfunction of lumbar region: Secondary | ICD-10-CM | POA: Diagnosis not present

## 2020-08-26 DIAGNOSIS — M9902 Segmental and somatic dysfunction of thoracic region: Secondary | ICD-10-CM | POA: Diagnosis not present

## 2020-08-26 DIAGNOSIS — M5136 Other intervertebral disc degeneration, lumbar region: Secondary | ICD-10-CM | POA: Diagnosis not present

## 2020-08-26 DIAGNOSIS — M5137 Other intervertebral disc degeneration, lumbosacral region: Secondary | ICD-10-CM | POA: Diagnosis not present

## 2020-08-30 DIAGNOSIS — M5136 Other intervertebral disc degeneration, lumbar region: Secondary | ICD-10-CM | POA: Diagnosis not present

## 2020-08-30 DIAGNOSIS — M5135 Other intervertebral disc degeneration, thoracolumbar region: Secondary | ICD-10-CM | POA: Diagnosis not present

## 2020-08-30 DIAGNOSIS — M9904 Segmental and somatic dysfunction of sacral region: Secondary | ICD-10-CM | POA: Diagnosis not present

## 2020-08-30 DIAGNOSIS — M9902 Segmental and somatic dysfunction of thoracic region: Secondary | ICD-10-CM | POA: Diagnosis not present

## 2020-08-30 DIAGNOSIS — M5137 Other intervertebral disc degeneration, lumbosacral region: Secondary | ICD-10-CM | POA: Diagnosis not present

## 2020-08-30 DIAGNOSIS — M9903 Segmental and somatic dysfunction of lumbar region: Secondary | ICD-10-CM | POA: Diagnosis not present

## 2020-09-01 DIAGNOSIS — M5135 Other intervertebral disc degeneration, thoracolumbar region: Secondary | ICD-10-CM | POA: Diagnosis not present

## 2020-09-01 DIAGNOSIS — M5136 Other intervertebral disc degeneration, lumbar region: Secondary | ICD-10-CM | POA: Diagnosis not present

## 2020-09-01 DIAGNOSIS — M9902 Segmental and somatic dysfunction of thoracic region: Secondary | ICD-10-CM | POA: Diagnosis not present

## 2020-09-01 DIAGNOSIS — M9903 Segmental and somatic dysfunction of lumbar region: Secondary | ICD-10-CM | POA: Diagnosis not present

## 2020-09-01 DIAGNOSIS — M9904 Segmental and somatic dysfunction of sacral region: Secondary | ICD-10-CM | POA: Diagnosis not present

## 2020-09-01 DIAGNOSIS — M5137 Other intervertebral disc degeneration, lumbosacral region: Secondary | ICD-10-CM | POA: Diagnosis not present

## 2020-09-06 DIAGNOSIS — M9902 Segmental and somatic dysfunction of thoracic region: Secondary | ICD-10-CM | POA: Diagnosis not present

## 2020-09-06 DIAGNOSIS — M9904 Segmental and somatic dysfunction of sacral region: Secondary | ICD-10-CM | POA: Diagnosis not present

## 2020-09-06 DIAGNOSIS — M5136 Other intervertebral disc degeneration, lumbar region: Secondary | ICD-10-CM | POA: Diagnosis not present

## 2020-09-06 DIAGNOSIS — M9903 Segmental and somatic dysfunction of lumbar region: Secondary | ICD-10-CM | POA: Diagnosis not present

## 2020-09-06 DIAGNOSIS — M5135 Other intervertebral disc degeneration, thoracolumbar region: Secondary | ICD-10-CM | POA: Diagnosis not present

## 2020-09-06 DIAGNOSIS — M5137 Other intervertebral disc degeneration, lumbosacral region: Secondary | ICD-10-CM | POA: Diagnosis not present

## 2020-09-08 DIAGNOSIS — M9903 Segmental and somatic dysfunction of lumbar region: Secondary | ICD-10-CM | POA: Diagnosis not present

## 2020-09-08 DIAGNOSIS — M5136 Other intervertebral disc degeneration, lumbar region: Secondary | ICD-10-CM | POA: Diagnosis not present

## 2020-09-08 DIAGNOSIS — M5135 Other intervertebral disc degeneration, thoracolumbar region: Secondary | ICD-10-CM | POA: Diagnosis not present

## 2020-09-08 DIAGNOSIS — M9902 Segmental and somatic dysfunction of thoracic region: Secondary | ICD-10-CM | POA: Diagnosis not present

## 2020-09-08 DIAGNOSIS — M5137 Other intervertebral disc degeneration, lumbosacral region: Secondary | ICD-10-CM | POA: Diagnosis not present

## 2020-09-08 DIAGNOSIS — M9904 Segmental and somatic dysfunction of sacral region: Secondary | ICD-10-CM | POA: Diagnosis not present

## 2020-09-20 DIAGNOSIS — M9902 Segmental and somatic dysfunction of thoracic region: Secondary | ICD-10-CM | POA: Diagnosis not present

## 2020-09-20 DIAGNOSIS — M5136 Other intervertebral disc degeneration, lumbar region: Secondary | ICD-10-CM | POA: Diagnosis not present

## 2020-09-20 DIAGNOSIS — M9904 Segmental and somatic dysfunction of sacral region: Secondary | ICD-10-CM | POA: Diagnosis not present

## 2020-09-20 DIAGNOSIS — M5135 Other intervertebral disc degeneration, thoracolumbar region: Secondary | ICD-10-CM | POA: Diagnosis not present

## 2020-09-20 DIAGNOSIS — M5137 Other intervertebral disc degeneration, lumbosacral region: Secondary | ICD-10-CM | POA: Diagnosis not present

## 2020-09-20 DIAGNOSIS — M9903 Segmental and somatic dysfunction of lumbar region: Secondary | ICD-10-CM | POA: Diagnosis not present

## 2020-10-09 ENCOUNTER — Other Ambulatory Visit: Payer: Self-pay | Admitting: Family Medicine

## 2020-10-09 DIAGNOSIS — I10 Essential (primary) hypertension: Secondary | ICD-10-CM

## 2020-10-18 ENCOUNTER — Ambulatory Visit: Payer: Medicare PPO | Admitting: Podiatry

## 2020-10-24 DIAGNOSIS — M5416 Radiculopathy, lumbar region: Secondary | ICD-10-CM | POA: Diagnosis not present

## 2020-10-24 DIAGNOSIS — G8929 Other chronic pain: Secondary | ICD-10-CM | POA: Diagnosis not present

## 2020-10-24 DIAGNOSIS — G9009 Other idiopathic peripheral autonomic neuropathy: Secondary | ICD-10-CM | POA: Diagnosis not present

## 2020-10-24 DIAGNOSIS — G621 Alcoholic polyneuropathy: Secondary | ICD-10-CM | POA: Diagnosis not present

## 2020-11-17 ENCOUNTER — Ambulatory Visit (INDEPENDENT_AMBULATORY_CARE_PROVIDER_SITE_OTHER): Payer: Medicare PPO

## 2020-11-17 DIAGNOSIS — Z1231 Encounter for screening mammogram for malignant neoplasm of breast: Secondary | ICD-10-CM | POA: Diagnosis not present

## 2020-11-17 DIAGNOSIS — Z780 Asymptomatic menopausal state: Secondary | ICD-10-CM

## 2020-11-17 DIAGNOSIS — Z0000 Encounter for general adult medical examination without abnormal findings: Secondary | ICD-10-CM | POA: Diagnosis not present

## 2020-11-17 DIAGNOSIS — Z1211 Encounter for screening for malignant neoplasm of colon: Secondary | ICD-10-CM

## 2020-11-17 NOTE — Progress Notes (Signed)
Subjective:   Rebecca Baird is a 75 y.o. female who presents for Medicare Annual (Subsequent) preventive examination.  I connected with Gina Leblond today by telephone and verified that I am speaking with the correct person using two identifiers. Location patient: home Location provider: work Persons participating in the virtual visit: patient, provider.   I discussed the limitations, risks, security and privacy concerns of performing an evaluation and management service by telephone and the availability of in person appointments. I also discussed with the patient that there may be a patient responsible charge related to this service. The patient expressed understanding and verbally consented to this telephonic visit.    Interactive audio and video telecommunications were attempted between this provider and patient, however failed, due to patient having technical difficulties OR patient did not have access to video capability.  We continued and completed visit with audio only.      Review of Systems    N/A  Cardiac Risk Factors include: advanced age (>41men, >51 women);hypertension     Objective:    Today's Vitals   11/17/20 1446  PainSc: 5    There is no height or weight on file to calculate BMI.  Advanced Directives 12/14/2019  Does Patient Have a Medical Advance Directive? No    Current Medications (verified) Outpatient Encounter Medications as of 11/17/2020  Medication Sig  . Cyanocobalamin (VITAMIN B12 PO) Take 5,000 mg by mouth daily.  Marland Kitchen desonide (DESOWEN) 0.05 % cream Apply topically daily as needed. Face  . folic acid (FOLVITE) 009 MCG tablet Take 400 mcg by mouth daily.  . furosemide (LASIX) 20 MG tablet TAKE 1 TABLET BY MOUTH EVERY DAY  . indomethacin (INDOCIN) 50 MG capsule   . metoprolol succinate (TOPROL-XL) 25 MG 24 hr tablet TAKE 1 TABLET BY MOUTH EVERY DAY  . omeprazole (PRILOSEC) 20 MG capsule   . rosuvastatin (CRESTOR) 10 MG tablet Take 1  tablet (10 mg total) by mouth daily.  . Tapentadol HCl 100 MG TABS Take 100 mg by mouth daily as needed.  . thiamine (VITAMIN B-1) 100 MG tablet Take 100 mg by mouth daily.  Marland Kitchen triamcinolone cream (KENALOG) 0.1 % Apply 1 application topically 2 (two) times daily as needed. Legs  . valsartan (DIOVAN) 160 MG tablet TAKE 1 TABLET BY MOUTH EVERY DAY  . Ascorbic Acid (VITAMIN C) 1000 MG tablet Take 1,000 mg by mouth daily. (Patient not taking: Reported on 11/17/2020)  . citalopram (CELEXA) 10 MG tablet Take 10 mg by mouth daily. (Patient not taking: Reported on 11/17/2020)   No facility-administered encounter medications on file as of 11/17/2020.    Allergies (verified) Epinephrine   History: Past Medical History:  Diagnosis Date  . Anxiety   . Depression   . GERD (gastroesophageal reflux disease)   . Hypertension   . Peripheral neuropathy    Past Surgical History:  Procedure Laterality Date  . broken ankle    . cataract surgery    . NOSE SURGERY     History reviewed. No pertinent family history. Social History   Socioeconomic History  . Marital status: Married    Spouse name: Not on file  . Number of children: 1  . Years of education: Not on file  . Highest education level: Associate degree: academic program  Occupational History  . Occupation: retired  Tobacco Use  . Smoking status: Former Smoker    Types: Cigarettes  . Smokeless tobacco: Never Used  Vaping Use  . Vaping Use: Never  used  Substance and Sexual Activity  . Alcohol use: Yes    Alcohol/week: 3.0 standard drinks    Types: 3 Glasses of wine per week  . Drug use: Not on file  . Sexual activity: Not on file  Other Topics Concern  . Not on file  Social History Narrative   Right handed   One story home   Drinks occasional caffeine   Social Determinants of Health   Financial Resource Strain: Low Risk   . Difficulty of Paying Living Expenses: Not hard at all  Food Insecurity: No Food Insecurity  . Worried  About Programme researcher, broadcasting/film/video in the Last Year: Never true  . Ran Out of Food in the Last Year: Never true  Transportation Needs: No Transportation Needs  . Lack of Transportation (Medical): No  . Lack of Transportation (Non-Medical): No  Physical Activity: Inactive  . Days of Exercise per Week: 0 days  . Minutes of Exercise per Session: 0 min  Stress: No Stress Concern Present  . Feeling of Stress : Not at all  Social Connections: Moderately Isolated  . Frequency of Communication with Friends and Family: More than three times a week  . Frequency of Social Gatherings with Friends and Family: Never  . Attends Religious Services: Never  . Active Member of Clubs or Organizations: No  . Attends Banker Meetings: Never  . Marital Status: Married    Tobacco Counseling Counseling given: Not Answered   Clinical Intake:  Pre-visit preparation completed: Yes  Pain : 0-10 Pain Score: 5  Pain Type: Chronic pain Pain Location: Hand Pain Orientation: Left,Right Pain Descriptors / Indicators: Tingling Pain Onset: More than a month ago Pain Frequency: Constant     Nutritional Risks: Nausea/ vomitting/ diarrhea (Diarrhea, constipation) Diabetes: No  How often do you need to have someone help you when you read instructions, pamphlets, or other written materials from your doctor or pharmacy?: 1 - Never  Diabetic?No   Interpreter Needed?: No  Information entered by :: SCrews,LPN   Activities of Daily Living In your present state of health, do you have any difficulty performing the following activities: 11/17/2020  Hearing? N  Vision? Y  Difficulty concentrating or making decisions? Y  Comment has difficulty finding her words  Walking or climbing stairs? Y  Dressing or bathing? Y  Comment has difficulty standing for long times and has issues taking a shower  Doing errands, shopping? Y  Preparing Food and eating ? N  Using the Toilet? N  In the past six months, have you  accidently leaked urine? Y  Comment has occassional bladder leakage  Do you have problems with loss of bowel control? N  Managing your Medications? N  Managing your Finances? N  Housekeeping or managing your Housekeeping? Y  Some recent data might be hidden    Patient Care Team: [place], Betty G, MD as PCP - General (Family Medicine) Glendale Chard, DO as Consulting Physician (Neurology)  Indicate any recent Medical Services you may have received from other than [facility] providers in the past year (date may be approximate).     Assessment:   This is a routine wellness examination for Jayleigh.  Hearing/Vision screen  Hearing Screening   125Hz  250Hz  500Hz  1000Hz  2000Hz  3000Hz  4000Hz  6000Hz  8000Hz   Right ear:           Left ear:           Vision Screening Comments: Patient states gets eyes examined once year.  Dietary issues and exercise activities discussed: Current Exercise Habits: The patient does not participate in regular exercise at present  Goals    . DIET - INCREASE WATER INTAKE    . Exercise 3x per week (30 min per time)      Depression Screen PHQ 2/9 Scores 11/17/2020 10/20/2019  PHQ - 2 Score 2 0  PHQ- 9 Score 7 -    Fall Risk Fall Risk  11/17/2020 12/14/2019 10/20/2019  Falls in the past year? 0 0 0  Number falls in past yr: 0 0 0  Injury with Fall? 0 0 0  Risk for fall due to : Impaired mobility - Impaired balance/gait;Orthopedic patient  Follow up Falls evaluation completed;Falls prevention discussed - Education provided    FALL RISK PREVENTION PERTAINING TO THE HOME:  Any stairs in or around the home? No  If so, are there any without handrails? No  Home free of loose throw rugs in walkways, pet beds, electrical cords, etc? Yes  Adequate lighting in your home to reduce risk of falls? Yes   ASSISTIVE DEVICES UTILIZED TO PREVENT FALLS:  Life alert? No  Use of a cane, walker or w/c? Yes  Grab bars in the bathroom? Yes  Shower chair or bench in shower? Yes   Elevated toilet seat or a handicapped toilet? No     Cognitive Function:     6CIT Screen 11/17/2020  What Year? 0 points  What month? 0 points  What time? 0 points  Count back from 20 0 points  Months in reverse 0 points  Repeat phrase 0 points  Total Score 0    Immunizations Immunization History  Administered Date(s) Administered  . Fluad Quad(high Dose 65+) 08/03/2020  . Influenza, High Dose Seasonal PF 07/07/2019  . Influenza,inj,Quad PF,6+ Mos 07/27/2018  . PFIZER SARS-COV-2 Vaccination 01/03/2020, 01/27/2020    TDAP status: Due, Education has been provided regarding the importance of this vaccine. Advised may receive this vaccine at local pharmacy or Health Dept. Aware to provide a copy of the vaccination record if obtained from local pharmacy or Health Dept. Verbalized acceptance and understanding.  Flu Vaccine status: Up to date  Pneumococcal vaccine status: Due, Education has been provided regarding the importance of this vaccine. Advised may receive this vaccine at local pharmacy or Health Dept. Aware to provide a copy of the vaccination record if obtained from local pharmacy or Health Dept. Verbalized acceptance and understanding.  Covid-19 vaccine status: Completed vaccines  Qualifies for Shingles Vaccine? Yes   Zostavax completed No   Shingrix Completed?: No.    Education has been provided regarding the importance of this vaccine. Patient has been advised to call insurance company to determine out of pocket expense if they have not yet received this vaccine. Advised may also receive vaccine at local pharmacy or Health Dept. Verbalized acceptance and understanding.  Screening Tests Health Maintenance  Topic Date Due  . Hepatitis C Screening  Never done  . COLONOSCOPY (Pts 45-75yrs Insurance coverage will need to be confirmed)  Never done  . MAMMOGRAM  Never done  . DEXA SCAN  Never done  . COVID-19 Vaccine (3 - Booster for Pfizer series) 07/29/2020  .  INFLUENZA VACCINE  Completed  . TETANUS/TDAP  Discontinued  . PNA vac Low Risk Adult  Discontinued    Health Maintenance  Health Maintenance Due  Topic Date Due  . Hepatitis C Screening  Never done  . COLONOSCOPY (Pts 45-24yrs Insurance coverage will need to be confirmed)  Never done  . MAMMOGRAM  Never done  . DEXA SCAN  Never done  . COVID-19 Vaccine (3 - Booster for Pfizer series) 07/29/2020    Colorectal cancer screening: Referral to GI placed 11/17/2020. Pt aware the office will call re: appt.  Mammogram status: Ordered 11/17/2020. Pt provided with contact info and advised to call to schedule appt.   Bone Density status: Ordered 11/17/2020. Pt provided with contact info and advised to call to schedule appt.  Lung Cancer Screening: (Low Dose CT Chest recommended if Age 29-80 years, 30 pack-year currently smoking OR have quit w/in 15years.) does not qualify.   Lung Cancer Screening Referral: N/A   Additional Screening:  Hepatitis C Screening: does qualify;   Vision Screening: Recommended annual ophthalmology exams for early detection of glaucoma and other disorders of the eye. Is the patient up to date with their annual eye exam?  Yes  Who is the provider or what is the name of the office in which the patient attends annual eye exams? [facility]  If pt is not established with a provider, would they like to be referred to a provider to establish care? No .   Dental Screening: Recommended annual dental exams for proper oral hygiene  Community Resource Referral / Chronic Care Management: CRR required this visit?  No   CCM required this visit?  No      Plan:     I have personally reviewed and noted the following in the patient's chart:   . Medical and social history . Use of alcohol, tobacco or illicit drugs  . Current medications and supplements . Functional ability and status . Nutritional status . Physical activity . Advanced directives . List of  other physicians . Hospitalizations, surgeries, and ER visits in previous 12 months . Vitals . Screenings to include cognitive, depression, and falls . Referrals and appointments  In addition, I have reviewed and discussed with patient certain preventive protocols, quality metrics, and best practice recommendations. A written personalized care plan for preventive services as well as general preventive health recommendations were provided to patient.     Theodora Blow, LPN   05/17/1949   Nurse Notes: None

## 2020-11-17 NOTE — Patient Instructions (Addendum)
Rebecca Baird , Thank you for taking time to come for your Medicare Wellness Visit. I appreciate your ongoing commitment to your health goals. Please review the following plan we discussed and let me know if I can assist you in the future.   Screening recommendations/referrals: Colonoscopy: Currently due, orders placed this visit  Mammogram: Currently due, orders placed this visit  Bone Density: Currently due orders placed this visit  Recommended yearly ophthalmology/optometry visit for glaucoma screening and checkup Recommended yearly dental visit for hygiene and checkup  Vaccinations: Influenza vaccine: Up to date, next due fall 2022  Pneumococcal vaccine: Currently due, you may receive at your next in person office visit  Tdap vaccine: Currently due, if you wish to receive you may contact your insurance to discuss any out of pocket cost or you ma await and injury to receive  Shingles vaccine: Currently due for Shingrix, if you wish to receive we recommend that you do so at your local pharmacy.    Advanced directives: Advance directive discussed with you today. Even though you declined this today please call our office should you change your mind and we can give you the proper paperwork for you to fill out.   Conditions/risks identified: None   Next appointment: 01/31/2021 @ 3:00 PM with Dr. [place]    Preventive Care 75 Years and Older, Female Preventive care refers to lifestyle choices and visits with your health care provider that can promote health and wellness. What does preventive care include?  A yearly physical exam. This is also called an annual well check.  Dental exams once or twice a year.  Routine eye exams. Ask your health care provider how often you should have your eyes checked.  Personal lifestyle choices, including:  Daily care of your teeth and gums.  Regular physical activity.  Eating a healthy diet.  Avoiding tobacco and drug use.  Limiting alcohol  use.  Practicing safe sex.  Taking low-dose aspirin every day.  Taking vitamin and mineral supplements as recommended by your health care provider. What happens during an annual well check? The services and screenings done by your health care provider during your annual well check will depend on your age, overall health, lifestyle risk factors, and family history of disease. Counseling  Your health care provider may ask you questions about your:  Alcohol use.  Tobacco use.  Drug use.  Emotional well-being.  Home and relationship well-being.  Sexual activity.  Eating habits.  History of falls.  Memory and ability to understand (cognition).  Work and work Astronomer.  Reproductive health. Screening  You may have the following tests or measurements:  Height, weight, and BMI.  Blood pressure.  Lipid and cholesterol levels. These may be checked every 5 years, or more frequently if you are over 75 years old.  Skin check.  Lung cancer screening. You may have this screening every year starting at age 75 if you have a 30-pack-year history of smoking and currently smoke or have quit within the past 15 years.  Fecal occult blood test (FOBT) of the stool. You may have this test every year starting at age 75  Flexible sigmoidoscopy or colonoscopy. You may have a sigmoidoscopy every 5 years or a colonoscopy every 10 years starting at age 39.  Hepatitis C blood test.  Hepatitis B blood test.  Sexually transmitted disease (STD) testing.  Diabetes screening. This is done by checking your blood sugar (glucose) after you have not eaten for a while (fasting). You may  have this done every 1-3 years.  Bone density scan. This is done to screen for osteoporosis. You may have this done starting at age 75  Mammogram. This may be done every 1-2 years. Talk to your health care provider about how often you should have regular mammograms. Talk with your health care provider about  your test results, treatment options, and if necessary, the need for more tests. Vaccines  Your health care provider may recommend certain vaccines, such as:  Influenza vaccine. This is recommended every year.  Tetanus, diphtheria, and acellular pertussis (Tdap, Td) vaccine. You may need a Td booster every 10 years.  Zoster vaccine. You may need this after age 75  Pneumococcal 13-valent conjugate (PCV13) vaccine. One dose is recommended after age 75  Pneumococcal polysaccharide (PPSV23) vaccine. One dose is recommended after age 75 Talk to your health care provider about which screenings and vaccines you need and how often you need them. This information is not intended to replace advice given to you by your health care provider. Make sure you discuss any questions you have with your health care provider. Document Released: 11/25/2015 Document Revised: 07/18/2016 Document Reviewed: 08/30/2015 Elsevier Interactive Patient Education  2017 [place] Prevention in the Home Falls can cause injuries. They can happen to people of all ages. There are many things you can do to make your home safe and to help prevent falls. What can I do on the outside of my home?  Regularly fix the edges of walkways and driveways and fix any cracks.  Remove anything that might make you trip as you walk through a door, such as a raised step or threshold.  Trim any bushes or trees on the path to your home.  Use bright outdoor lighting.  Clear any walking paths of anything that might make someone trip, such as rocks or tools.  Regularly check to see if handrails are loose or broken. Make sure that both sides of any steps have handrails.  Any raised decks and porches should have guardrails on the edges.  Have any leaves, snow, or ice cleared regularly.  Use sand or salt on walking paths during winter.  Clean up any spills in your garage right away. This includes oil or grease spills. What  can I do in the bathroom?  Use night lights.  Install grab bars by the toilet and in the tub and shower. Do not use towel bars as grab bars.  Use non-skid mats or decals in the tub or shower.  If you need to sit down in the shower, use a plastic, non-slip stool.  Keep the floor dry. Clean up any water that spills on the floor as soon as it happens.  Remove soap buildup in the tub or shower regularly.  Attach bath mats securely with double-sided non-slip rug tape.  Do not have throw rugs and other things on the floor that can make you trip. What can I do in the bedroom?  Use night lights.  Make sure that you have a light by your bed that is easy to reach.  Do not use any sheets or blankets that are too big for your bed. They should not hang down onto the floor.  Have a firm chair that has side arms. You can use this for support while you get dressed.  Do not have throw rugs and other things on the floor that can make you trip. What can I do in the kitchen?  Clean up  any spills right away.  Avoid walking on wet floors.  Keep items that you use a lot in easy-to-reach places.  If you need to reach something above you, use a strong step stool that has a grab bar.  Keep electrical cords out of the way.  Do not use floor polish or wax that makes floors slippery. If you must use wax, use non-skid floor wax.  Do not have throw rugs and other things on the floor that can make you trip. What can I do with my stairs?  Do not leave any items on the stairs.  Make sure that there are handrails on both sides of the stairs and use them. Fix handrails that are broken or loose. Make sure that handrails are as long as the stairways.  Check any carpeting to make sure that it is firmly attached to the stairs. Fix any carpet that is loose or worn.  Avoid having throw rugs at the top or bottom of the stairs. If you do have throw rugs, attach them to the floor with carpet tape.  Make sure  that you have a light switch at the top of the stairs and the bottom of the stairs. If you do not have them, ask someone to add them for you. What else can I do to help prevent falls?  Wear shoes that:  Do not have high heels.  Have rubber bottoms.  Are comfortable and fit you well.  Are closed at the toe. Do not wear sandals.  If you use a stepladder:  Make sure that it is fully opened. Do not climb a closed stepladder.  Make sure that both sides of the stepladder are locked into place.  Ask someone to hold it for you, if possible.  Clearly mark and make sure that you can see:  Any grab bars or handrails.  First and last steps.  Where the edge of each step is.  Use tools that help you move around (mobility aids) if they are needed. These include:  Canes.  Walkers.  Scooters.  Crutches.  Turn on the lights when you go into a dark area. Replace any light bulbs as soon as they burn out.  Set up your furniture so you have a clear path. Avoid moving your furniture around.  If any of your floors are uneven, fix them.  If there are any pets around you, be aware of where they are.  Review your medicines with your doctor. Some medicines can make you feel dizzy. This can increase your chance of falling. Ask your doctor what other things that you can do to help prevent falls. This information is not intended to replace advice given to you by your health care provider. Make sure you discuss any questions you have with your health care provider. Document Released: 08/25/2009 Document Revised: 04/05/2016 Document Reviewed: 12/03/2014 Elsevier Interactive Patient Education  2017 [employer].

## 2020-12-05 ENCOUNTER — Other Ambulatory Visit: Payer: Self-pay | Admitting: Family Medicine

## 2020-12-07 ENCOUNTER — Encounter: Payer: Self-pay | Admitting: Family Medicine

## 2020-12-07 ENCOUNTER — Telehealth: Payer: Self-pay | Admitting: Gastroenterology

## 2020-12-07 NOTE — Telephone Encounter (Signed)
Please refer to Dr. Orvan Falconer response below

## 2020-12-07 NOTE — Telephone Encounter (Signed)
Awaiting records for review.

## 2020-12-07 NOTE — Telephone Encounter (Signed)
Hi Dr. Orvan Falconer,  We have received a referral from patient's PCP for a repeat colon. Patient had colon in 02/27/2016.  Records/Pathology report have been received from [facility], [place], [place].; Endoscopist; dr. Johny Shock.  They will be sent for your review.. Please advise on scheduling..   Thank you

## 2020-12-07 NOTE — Telephone Encounter (Signed)
Outside records reviewed.  Colonoscopy with Dr. Osvaldo Angst at the [facility] in [place] 03/07/2016 for a personal history of colon polyps revealed 2 polyps in the rectum, a polyp at the hepatic flexure, 3 sigmoid polyps, sigmoid diverticulosis, and small internal hemorrhoids.  Pathology results revealed 4 hyperplastic polyps, prominent lymphoid aggregates, and no adenomas.   We need details about the prior history of colon polyps and family history to make recommendations about surveillance exam, unless, we have that documented somewhere by Dr. Osvaldo Angst. His procedure note does not clarify.  Thanks.

## 2020-12-08 ENCOUNTER — Other Ambulatory Visit: Payer: Self-pay | Admitting: Family Medicine

## 2020-12-08 DIAGNOSIS — Z1231 Encounter for screening mammogram for malignant neoplasm of breast: Secondary | ICD-10-CM

## 2020-12-08 DIAGNOSIS — Z780 Asymptomatic menopausal state: Secondary | ICD-10-CM

## 2020-12-16 NOTE — Telephone Encounter (Addendum)
Good morning Dr. Orvan Falconer, we received additional records for patient.  Will be sending for you to review please.  Did talk with patient who stated there is no family history of polyps.

## 2020-12-19 NOTE — Telephone Encounter (Signed)
Additional outside records reviewed.    Colonoscopy with Dr. Sandrea Hammond 01/11/6 2007 for routine colon cancer screening revealed 3 small polyps and small internal hemorrhoids.  Pathology results not available.  Colonoscopy with Dr. Osvaldo Angst at the [facility] in [place] 03/07/2016 for a personal history of colon polyps revealed 2 polyps in the rectum, a polyp at the hepatic flexure, 3 sigmoid polyps, sigmoid diverticulosis, and small internal hemorrhoids.  Pathology results revealed 4 hyperplastic polyps, prominent lymphoid aggregates, and no adenomas.    We need details about the prior history of colon polyps and family history to make recommendations about surveillance exam, unless, we have that documented somewhere by Dr. Osvaldo Angst. His procedure note does not clarify.

## 2020-12-26 NOTE — Telephone Encounter (Signed)
Good morning Dr. Orvan Falconer, called patient's last GI provider and they faxed over additional records for patient; office notes and colonoscopy from 2012.  Will send to you for review please.

## 2020-12-26 NOTE — Telephone Encounter (Signed)
I will look for those results.

## 2020-12-26 NOTE — Telephone Encounter (Signed)
Additional outside records reviewed.   Colonoscopy with Dr. Sandrea Hammond 01/11/6 2007 for routine colon cancer screening revealed 3 small polyps and small internal hemorrhoids.  Pathology results not available.  Colonoscopy with Dr. Molly Maduro the [facility] in [place] 03/07/2016 for a personal history of colon polyps revealed 2 polyps in the rectum, a polyp at the hepatic flexure, 3 sigmoid polyps, sigmoid diverticulosis, and small internal hemorrhoids. Pathology results revealed 4 hyperplastic polyps, prominent lymphoid aggregates, and no adenomas.   Pathology letter from Dr. Sande Rives 03/19/2016 recommended a screening colonoscopy in 10 years.  Given these records, colonoscopy recommended in 2017. Earlier with new symptoms or development of family history. Thank you.

## 2020-12-28 NOTE — Telephone Encounter (Signed)
Left voicemail for patient to call back to inform.

## 2020-12-29 DIAGNOSIS — G8929 Other chronic pain: Secondary | ICD-10-CM | POA: Diagnosis not present

## 2020-12-29 DIAGNOSIS — M5416 Radiculopathy, lumbar region: Secondary | ICD-10-CM | POA: Diagnosis not present

## 2021-01-19 ENCOUNTER — Ambulatory Visit
Admission: RE | Admit: 2021-01-19 | Discharge: 2021-01-19 | Disposition: A | Discharge status: Home / Self Care | Payer: Medicare PPO | Source: Ambulatory Visit | Attending: Family Medicine | Admitting: Family Medicine

## 2021-01-19 ENCOUNTER — Other Ambulatory Visit: Payer: Self-pay

## 2021-01-19 DIAGNOSIS — Z1231 Encounter for screening mammogram for malignant neoplasm of breast: Secondary | ICD-10-CM

## 2021-01-20 ENCOUNTER — Ambulatory Visit: Payer: Medicare PPO

## 2021-01-24 ENCOUNTER — Other Ambulatory Visit: Payer: Self-pay | Admitting: Family Medicine

## 2021-01-24 DIAGNOSIS — R600 Localized edema: Secondary | ICD-10-CM

## 2021-01-31 ENCOUNTER — Ambulatory Visit: Payer: Medicare PPO | Admitting: Family Medicine

## 2021-02-08 ENCOUNTER — Other Ambulatory Visit: Payer: Self-pay

## 2021-02-08 ENCOUNTER — Ambulatory Visit: Payer: Medicare PPO | Admitting: Family Medicine

## 2021-02-08 ENCOUNTER — Encounter: Payer: Self-pay | Admitting: Family Medicine

## 2021-02-08 VITALS — BP 130/70 | HR 100 | Resp 16 | Ht 65.5 in | Wt 169.4 lb

## 2021-02-08 DIAGNOSIS — I10 Essential (primary) hypertension: Secondary | ICD-10-CM

## 2021-02-08 DIAGNOSIS — R600 Localized edema: Secondary | ICD-10-CM | POA: Diagnosis not present

## 2021-02-08 DIAGNOSIS — K5900 Constipation, unspecified: Secondary | ICD-10-CM | POA: Diagnosis not present

## 2021-02-08 DIAGNOSIS — L821 Other seborrheic keratosis: Secondary | ICD-10-CM | POA: Diagnosis not present

## 2021-02-08 DIAGNOSIS — E782 Mixed hyperlipidemia: Secondary | ICD-10-CM

## 2021-02-08 DIAGNOSIS — L219 Seborrheic dermatitis, unspecified: Secondary | ICD-10-CM | POA: Diagnosis not present

## 2021-02-08 DIAGNOSIS — G621 Alcoholic polyneuropathy: Secondary | ICD-10-CM

## 2021-02-08 DIAGNOSIS — G6289 Other specified polyneuropathies: Secondary | ICD-10-CM

## 2021-02-08 DIAGNOSIS — R202 Paresthesia of skin: Secondary | ICD-10-CM | POA: Diagnosis not present

## 2021-02-08 MED ORDER — GABAPENTIN 100 MG PO CAPS: ORAL_CAPSULE | ORAL | 0 refills | Status: DC

## 2021-02-08 NOTE — Progress Notes (Signed)
HPI: [name] is a 75 y.o. female, who is here today for 6 months follow up.   She was last seen on 08/03/20.  -HTN: She is on Valsartan 160 mg daily and Metoprolol succinate 25 mg daily. Negative for severe/frequent headache, visual changes, chest pain, dyspnea, or palpitation.  -LE edema, which she thinks is getting worse. She has not identified exacerbating or alleviating factors. It seems to be same in the morning and at night. She has not noted decreased urine output, foam in urine,or gross hematuria. Nocturia x 2 for about a year, not every night. She is on Furosemide 20 mg daily. Negative for orthopnea or PND.  Lab Results  Component Value Date   CREATININE 0.88 08/03/2020   BUN 10 08/03/2020   NA 141 08/03/2020   K 4.3 08/03/2020   CL 103 08/03/2020   CO2 30 08/03/2020   Lab Results  Component Value Date   ALT 13 08/03/2020   AST 21 08/03/2020   ALKPHOS 68 02/19/2020   BILITOT 0.4 08/03/2020   HLD:  She is on Crestor 10 mg daily. Tolerating medication well. Lab Results  Component Value Date   CHOL 338 (H) 02/19/2020   HDL 53.50 02/19/2020   LDLCALC 252 (H) 02/19/2020   TRIG 162.0 (H) 02/19/2020   CHOLHDL 6 02/19/2020   She has several concerns today, some are new. -Left-sided neck tingling that extends from TMJ to supraclavicular area. Problem started about a months ago, it is intermittent, it happens "several" times per day and last a few seconds.  It seems to be exacerbated by certain movement but she is not sure. She has not identified alleviating factors. No hx of falls or recent trauma. She has not noted skin rash on affected area.  -Some skin lesions/moles on UE's and rough sensation,"bumpy", behind right ear. She cannot see area, so she is not sure about rash. Noted problem "a few months" ago. No changes. No pruritus or pain. She has not tried OTC treatments.  -Neuropathy and chronic pain getting worse. She follows with pain  management. She takes Tapentadol 100 mg daily prn. On her medication list is also Indomethacin, she is not sure if she is taking it. Medication does not seem to be helping with pain.  Peripheral neuropathy, alcohol related. Drinks 3 glasses of wine at night. She is on B1 and folic acid supplementation. Numbness and tingling affects upper and lower extremities (below knees). LE symptoms alleviated by massage, "feels good."  Unstable gait, intermittent dizziness, and generalized pain.  She remembers taking Gabapentin years ago, she does not think she took medication long enough , so would like to try again.  Mentions that head imaging has shown some abnormalities, this was years ago. She was evaluated by neurologist on 12/14/19.  Head CT in 06/2018 after trauma: There is no evidence of acute intracranial hemorrhage, edema, mass effect, or midline shift. There is mild diffuse parenchymal volume loss. No ventriculomegaly. The basilar cisterns are patent. Mild hypoattenuation in the periventricular white matter is compatible with sequela of senescent small vessel ischemic disease. Atherosclerotic calcifications are present in the carotid siphons.   Orbits are unremarkable. There is a nasal bone fracture and an angulated fracture of the nasal septum. No depressed calvarial fracture.  -Constipation seems to be worse. OTC stool softener helps. She has a few bowel movements per day. According to pt, she has been asking GI to do a colonoscopy but was told she was not due for one  yet. She is not having abdominal pain,melena,or blood in stool.  She is concerned because cannot feel when she is having a bowel movement, not sure for how long, around 6 months. Denies fecal incontinence or saddle anesthesia. She feels the urge and knows when she needs to have a bowel movement. Hx of urine incontinence, urgency, stable. Back pain is not radiated, it has improved with chiropractor treatment. States  that she has never had a lumbar MRI before but had plain imaging done at her chiropractor's office.  Review of Systems  Constitutional: Positive for fatigue. Negative for activity change, appetite change and fever.  HENT: Negative for mouth sores, nosebleeds and sore throat.   Respiratory: Negative for cough and wheezing.   Gastrointestinal: Negative for nausea and vomiting.  Endocrine: Negative for cold intolerance and heat intolerance.  Genitourinary: Negative for difficulty urinating and dysuria.  Musculoskeletal: Positive for arthralgias, back pain and gait problem.  Neurological: Negative for syncope and facial asymmetry.  Psychiatric/Behavioral: Negative for confusion. The patient is nervous/anxious.   Rest of ROS, see pertinent positives sand negatives in HPI  Current Outpatient Medications on File Prior to Visit  Medication Sig Dispense Refill  . Ascorbic Acid (VITAMIN C) 1000 MG tablet Take 1,000 mg by mouth daily.    . Cyanocobalamin (VITAMIN B12 PO) Take 5,000 mg by mouth daily.    Marland Kitchen desonide (DESOWEN) 0.05 % cream Apply topically daily as needed. Face 30 g 0  . folic acid (FOLVITE) 800 MCG tablet Take 400 mcg by mouth daily.    . furosemide (LASIX) 20 MG tablet TAKE 1 TABLET BY MOUTH EVERY DAY 90 tablet 1  . indomethacin (INDOCIN) 50 MG capsule     . metoprolol succinate (TOPROL-XL) 25 MG 24 hr tablet TAKE 1 TABLET BY MOUTH EVERY DAY 90 tablet 2  . omeprazole (PRILOSEC) 20 MG capsule     . rosuvastatin (CRESTOR) 10 MG tablet TAKE 1 TABLET BY MOUTH EVERY DAY 90 tablet 0  . Tapentadol HCl 100 MG TABS Take 100 mg by mouth daily as needed.    . thiamine (VITAMIN B-1) 100 MG tablet Take 100 mg by mouth daily.    Marland Kitchen triamcinolone cream (KENALOG) 0.1 % Apply 1 application topically 2 (two) times daily as needed. Legs 45 g 1  . valsartan (DIOVAN) 160 MG tablet TAKE 1 TABLET BY MOUTH EVERY DAY 90 tablet 1   No current facility-administered medications on file prior to visit.     Past Medical History:  Diagnosis Date  . Anxiety   . Depression   . GERD (gastroesophageal reflux disease)   . Hypertension   . Peripheral neuropathy    Allergies  Allergen Reactions  . Epinephrine Other (See Comments) and Palpitations    "Heart Races Uncomfortably"     Social History   Socioeconomic History  . Marital status: Married    Spouse name: Not on file  . Number of children: 1  . Years of education: Not on file  . Highest education level: Associate degree: academic program  Occupational History  . Occupation: retired  Tobacco Use  . Smoking status: Former Smoker    Types: Cigarettes  . Smokeless tobacco: Never Used  Vaping Use  . Vaping Use: Never used  Substance and Sexual Activity  . Alcohol use: Yes    Alcohol/week: 3.0 standard drinks    Types: 3 Glasses of wine per week  . Drug use: Not on file  . Sexual activity: Not on file  Other  Topics Concern  . Not on file  Social History Narrative   Right handed   One story home   Drinks occasional caffeine   Social Determinants of Health   Financial Resource Strain: Low Risk   . Difficulty of Paying Living Expenses: Not hard at all  Food Insecurity: No Food Insecurity  . Worried About Programme researcher, broadcasting/film/video in the Last Year: Never true  . Ran Out of Food in the Last Year: Never true  Transportation Needs: No Transportation Needs  . Lack of Transportation (Medical): No  . Lack of Transportation (Non-Medical): No  Physical Activity: Inactive  . Days of Exercise per Week: 0 days  . Minutes of Exercise per Session: 0 min  Stress: No Stress Concern Present  . Feeling of Stress : Not at all  Social Connections: Moderately Isolated  . Frequency of Communication with Friends and Family: More than three times a week  . Frequency of Social Gatherings with Friends and Family: Never  . Attends Religious Services: Never  . Active Member of Clubs or Organizations: No  . Attends Banker Meetings:  Never  . Marital Status: Married   Vitals:   02/08/21 1456  BP: 130/70  Pulse: 100  Resp: 16  SpO2: 97%   Wt Readings from Last 3 Encounters:  02/08/21 169 lb 6 oz (76.8 kg)  08/03/20 162 lb 8 oz (73.7 kg)  02/19/20 161 lb 6 oz (73.2 kg)   Body mass index is 27.76 kg/m.  Physical Exam Vitals and nursing note reviewed. Exam conducted with a chaperone present.  Constitutional:      General: She is not in acute distress.    Appearance: She is well-developed.  HENT:     Head: Normocephalic and atraumatic.     Mouth/Throat:     Mouth: Mucous membranes are moist.     Pharynx: Oropharynx is clear.  Eyes:     Conjunctiva/sclera: Conjunctivae normal.  Neck:     Vascular: No carotid bruit.  Cardiovascular:     Rate and Rhythm: Normal rate and regular rhythm.     Pulses:          Dorsalis pedis pulses are 2+ on the right side and 2+ on the left side.     Heart sounds: No murmur heard.   Pulmonary:     Effort: Pulmonary effort is normal. No respiratory distress.     Breath sounds: Normal breath sounds.  Abdominal:     Palpations: Abdomen is soft.     Tenderness: There is no abdominal tenderness.  Genitourinary:    Exam position: Knee-chest position.     Rectum: No mass or tenderness. Normal anal tone.     Comments: Soft light brown stool in rectum.  Musculoskeletal:     Cervical back: Tenderness present. No deformity. Decreased range of motion.       Back:     Right lower leg: 3+ Pitting Edema present.     Left lower leg: 3+ Pitting Edema present.  Lymphadenopathy:     Cervical: No cervical adenopathy.  Skin:    General: Skin is warm.     Comments: On back some SK. No rash appreciated behind right ear, there is a non erythematous papular lesion, 1 mm, with rough surface. Some fine scaly area on scalp, no erythema.  No suspicious lesions appreciated. On medial aspect of buttocks with mild erythema and scaly/dry. No tenderness or induration.  Neurological:      Mental Status: She  is alert and oriented to person, place, and time.     Cranial Nerves: No cranial nerve deficit.     Gait: Gait abnormal.     Comments: Unstable gait assisted with a cane.  Psychiatric:        Mood and Affect: Mood is anxious.    ASSESSMENT AND PLAN:  Ms. Georgena Spurlingatricia Mengel Zwilling was seen today for 6 months follow-up.  Orders Placed This Encounter  Procedures  . Urinalysis, Routine w reflex microscopic  . Comprehensive metabolic panel  . Vitamin B12  . TSH  . CBC with Differential/Platelet   Lab Results  Component Value Date   TSH 0.95 02/08/2021   Lab Results  Component Value Date   WBC 8.1 02/08/2021   HGB 11.9 (L) 02/08/2021   HCT 35.5 (L) 02/08/2021   MCV 98.0 02/08/2021   PLT 220.0 02/08/2021   Lab Results  Component Value Date   VITAMINB12 >1506 (H) 02/08/2021   Lab Results  Component Value Date   CREATININE 1.31 (H) 02/08/2021   BUN 20 02/08/2021   NA 139 02/08/2021   K 4.8 02/08/2021   CL 105 02/08/2021   CO2 25 02/08/2021   Lab Results  Component Value Date   ALT 22 02/08/2021   AST 24 02/08/2021   ALKPHOS 58 02/08/2021   BILITOT 0.6 02/08/2021    Constipation, unspecified constipation type Problem is chronic and OTC stool softener is helping. Adequate hydration and fiber intake also recommended.  On rectal exam there is not decrease sensation and tone is normal. We discussed the option of having a lumbar MRI, she prefers to wait for now. We also reviewed colon cancer screening guidelines.  Benign essential hypertension Problem is adequately controlled. Continue same dose of Valsartan and Metoprolol succinate. Low salt diet.  Bilateral lower extremity edema Chronic. We discussed possible etiologies and side effects of Furosemide. ? Venous insufficiency. LE elevation above heart level and compression stocking will help. Further recommendations according to lab results, we could increase dose of Furosemide.  Seborrheic  dermatitis, unspecified Some of skin changes behind ears could be associated to this problem. Continue monitoring for now. She has Desonide cream to use if pruritus present.  Tingling sensation Left-sided neck tingling reported as a new problem. We discussed possible etiologies. Most likely related to peripheral neuropathy. Radiculopathy also to be considered. For now I will hold on imaging. Further recommendations according to lab results.  Other polyneuropathy We discussed Dx.prognosis,and treatment options. She would like to try Gabapentin, side effects discussed. Gabapentin to start 100 mg and instructed to titrate dose to 300 mg at bedtime as tolerated.  -     gabapentin (NEURONTIN) 100 MG capsule; 1 cap at bedtime for 10 days then increase to 2 caps for 10 days and then 3 caps.  Alcoholic peripheral neuropathy (HCC) Stressed the importance of progressively decreasing alcohol intake. Fall precautions.  Seborrheic keratosis Reassured. Continue monitoring for changes.  Hyperlipemia, mixed She ate today at 12 noon, so we will plan on checking FLP next visit.  Spent 59 minutes.  During this time history was obtained and documented, examination was performed, prior labs/imaging reviewed, and assessment/plan discussed.  Return in about 6 weeks (around 03/22/2021).  Andry Bogden G. [place], MD  [facility]. [place] office.   A few things to remember from today's visit:   Alcoholic peripheral neuropathy (HCC)  Benign essential hypertension  Bilateral lower extremity edema  Seborrheic dermatitis, unspecified  If you need refills please call your pharmacy.  Do not use My Chart to request refills or for acute issues that need immediate attention.   Skin lesions do not seem suspicious. Tingling could be related to peripheral neuropathy. Gabapentin 100 mg at bedtime for 10 days then 2 caps and then 3 caps (300 mg). Fall precautions. Carotid ultrasound will be  arranged.  Please be sure medication list is accurate. If a new problem present, please set up appointment sooner than planned today.

## 2021-02-08 NOTE — Patient Instructions (Addendum)
A few things to remember from today's visit:   Alcoholic peripheral neuropathy (HCC)  Benign essential hypertension  Bilateral lower extremity edema  Seborrheic dermatitis, unspecified  If you need refills please call your pharmacy. Do not use My Chart to request refills or for acute issues that need immediate attention.   Skin lesions do not seem suspicious. Tingling could be related to peripheral neuropathy. Gabapentin 100 mg at bedtime for 10 days then 2 caps and then 3 caps (300 mg). Fall precautions. Carotid ultrasound will be arranged.  Please be sure medication list is accurate. If a new problem present, please set up appointment sooner than planned today.

## 2021-02-09 ENCOUNTER — Other Ambulatory Visit: Payer: Self-pay | Admitting: Family Medicine

## 2021-02-09 LAB — URINALYSIS, ROUTINE W REFLEX MICROSCOPIC
Bilirubin Urine: NEGATIVE
Hgb urine dipstick: NEGATIVE
Nitrite: NEGATIVE
Specific Gravity, Urine: >=1.03 — AB (ref 1.000–1.030)
Total Protein, Urine: NEGATIVE
Urine Glucose: NEGATIVE
Urobilinogen, UA: 1 (ref 0.0–1.0)
pH: 5.5 (ref 5.0–8.0)

## 2021-02-09 LAB — CBC WITH DIFFERENTIAL/PLATELET
Basophils Absolute: 0.1 10*3/uL (ref 0.0–0.1)
Basophils Relative: 0.8 % (ref 0.0–3.0)
Eosinophils Absolute: 0.2 10*3/uL (ref 0.0–0.7)
Eosinophils Relative: 2.2 % (ref 0.0–5.0)
HCT: 35.5 % — ABNORMAL LOW (ref 36.0–46.0)
Hemoglobin: 11.9 g/dL — ABNORMAL LOW (ref 12.0–15.0)
Lymphocytes Relative: 20.1 % (ref 12.0–46.0)
Lymphs Abs: 1.6 10*3/uL (ref 0.7–4.0)
MCHC: 33.7 g/dL (ref 30.0–36.0)
MCV: 98 fl (ref 78.0–100.0)
Monocytes Absolute: 0.6 10*3/uL (ref 0.1–1.0)
Monocytes Relative: 6.9 % (ref 3.0–12.0)
Neutro Abs: 5.7 10*3/uL (ref 1.4–7.7)
Neutrophils Relative %: 70 % (ref 43.0–77.0)
Platelets: 220 10*3/uL (ref 150.0–400.0)
RBC: 3.62 Mil/uL — ABNORMAL LOW (ref 3.87–5.11)
RDW: 13.6 % (ref 11.5–15.5)
WBC: 8.1 10*3/uL (ref 4.0–10.5)

## 2021-02-09 LAB — COMPREHENSIVE METABOLIC PANEL
ALT: 22 U/L (ref 0–35)
AST: 24 U/L (ref 0–37)
Albumin: 4.5 g/dL (ref 3.5–5.2)
Alkaline Phosphatase: 58 U/L (ref 39–117)
BUN: 20 mg/dL (ref 6–23)
CO2: 25 mEq/L (ref 19–32)
Calcium: 9.9 mg/dL (ref 8.4–10.5)
Chloride: 105 mEq/L (ref 96–112)
Creatinine, Ser: 1.31 mg/dL — ABNORMAL HIGH (ref 0.40–1.20)
GFR: 40.08 mL/min — ABNORMAL LOW (ref >60.00)
Glucose, Bld: 105 mg/dL — ABNORMAL HIGH (ref 70–99)
Potassium: 4.8 mEq/L (ref 3.5–5.1)
Sodium: 139 mEq/L (ref 135–145)
Total Bilirubin: 0.6 mg/dL (ref 0.2–1.2)
Total Protein: 7.2 g/dL (ref 6.0–8.3)

## 2021-02-09 LAB — VITAMIN B12: Vitamin B-12: >1506 pg/mL — ABNORMAL HIGH (ref 211–911)

## 2021-02-09 LAB — TSH: TSH: 0.95 u[IU]/mL (ref 0.35–4.50)

## 2021-03-02 DIAGNOSIS — G8929 Other chronic pain: Secondary | ICD-10-CM | POA: Diagnosis not present

## 2021-03-02 DIAGNOSIS — M5416 Radiculopathy, lumbar region: Secondary | ICD-10-CM | POA: Diagnosis not present

## 2021-03-16 ENCOUNTER — Ambulatory Visit: Payer: Medicare PPO | Admitting: Family Medicine

## 2021-03-16 ENCOUNTER — Encounter: Payer: Self-pay | Admitting: Family Medicine

## 2021-03-16 ENCOUNTER — Other Ambulatory Visit: Payer: Self-pay

## 2021-03-16 VITALS — BP 130/70 | HR 104 | Temp 98.5°F | Resp 16 | Ht 65.5 in | Wt 170.6 lb

## 2021-03-16 DIAGNOSIS — K5900 Constipation, unspecified: Secondary | ICD-10-CM | POA: Diagnosis not present

## 2021-03-16 DIAGNOSIS — G6289 Other specified polyneuropathies: Secondary | ICD-10-CM | POA: Diagnosis not present

## 2021-03-16 DIAGNOSIS — M542 Cervicalgia: Secondary | ICD-10-CM | POA: Diagnosis not present

## 2021-03-16 MED ORDER — GABAPENTIN 100 MG PO CAPS: 200.0000 mg | ORAL_CAPSULE | Freq: Three times a day (TID) | ORAL | 0 refills | Status: DC

## 2021-03-16 NOTE — Assessment & Plan Note (Addendum)
Reporting great improvement. Explained that this is a chronic problem, she still may have periods of exacerbation. Chronic opioid use can certainly aggravate problem. Continue adequate fiber and fluid intake as well as OTC MiraLAX.

## 2021-03-16 NOTE — Assessment & Plan Note (Addendum)
She reported improvement with gabapentin. We discussed some side effects. Gabapentin to be titrated from 100 mg 3 times daily to 200 mg and then 300 mg, increasing dose every 5 days as tolerated. Appropriate skin care, checking feet and avoiding trauma. Fall precautions also discussed.

## 2021-03-16 NOTE — Patient Instructions (Addendum)
A few things to remember from today's visit:   Neck pain on left side - Plan: VAS US CAROTID  Other polyneuropathy - Plan: gabapentin (NEURONTIN) 100 MG capsule  If you need refills please call your pharmacy. Do not use My Chart to request refills or for acute issues that need immediate attention.   Let continue increasing gabapentin as tolerated. Add another cap of Gabapentin 100 mg at night (200 mg) ,in 5 days add another one at noon,and 5 days later another one in the morning. Then we can try 300 mg , adding one cap at the time every 5 days as tolerated. Please be sure medication list is accurate. If a new problem present, please set up appointment sooner than planned today.

## 2021-03-16 NOTE — Progress Notes (Signed)
HPI: Rebecca Baird is a 75 y.o. female with hx of HTN,GERD,chronic pain,alcoholic peripheral neuropathy, and unstable gait here today to follow on recent OV. She was last seen on 02/08/2021, when she had a few concerns.  Peripheral neuropathy, numbness,burning, and tingling affecting upper and lower extremities; stocking glove distribution. Areas very sensitive to light touch. She is showering once per week because even water touching her skin exacerbates pain.  She has followed with neurologist, last seen 12/14/19. She underwent NCS/EMG of LE's at [facility].  She started gabapentin, taking 100 mg 3 times daily.  She is tolerating medication well and she feels like medication has helped so she would like to continue.  Still having left-sided neck discomfort, difficult to describe, tingling like back and fourth from jaw to supraclavicular area. She has not noted edema or deformity. No hx of trauma. I did not hear a bruit last visit, carotid US was planned to be arranged to evaluate for carotid artery stenosis. Negative for syncope.  She is also reporting that her constipation has improved. She is taking MiraLAX daily.  May not have a bowel movement in 3-4 days. Periumbilical abdominal cramps that improve some with defecation.  For the past 3 days she has had good bowel movements, wonders if problem is gone for good, "cured." She has not noted blood in stool or melena,nausea or vomiting.  Since her last visit she has followed with pain management.  She is getting indomethacin from her pain clinic.She takes about twice per week for joint pain. She is also on Tapentadol 100 mg daily prn.  Review of Systems  Constitutional: Positive for fatigue. Negative for activity change, appetite change and fever.  HENT: Negative for mouth sores, nosebleeds and sore throat.   Eyes: Negative for redness and visual disturbance.  Respiratory: Negative for cough, shortness of breath  and wheezing.   Cardiovascular: Negative for chest pain, palpitations and leg swelling.  Genitourinary: Negative for decreased urine volume and hematuria.  Musculoskeletal: Positive for arthralgias and gait problem.  Neurological: Negative for syncope, facial asymmetry and weakness.  Rest see pertinent positives and negatives per HPI.  Current Outpatient Medications on File Prior to Visit  Medication Sig Dispense Refill  . Ascorbic Acid (VITAMIN C) 1000 MG tablet Take 1,000 mg by mouth daily.    . Cyanocobalamin (VITAMIN B12 PO) Take 5,000 mg by mouth daily.    Marland Kitchen desonide (DESOWEN) 0.05 % cream Apply topically daily as needed. Face 30 g 0  . folic acid (FOLVITE) 800 MCG tablet Take 400 mcg by mouth daily.    . furosemide (LASIX) 20 MG tablet TAKE 1 TABLET BY MOUTH EVERY DAY 90 tablet 1  . indomethacin (INDOCIN) 50 MG capsule     . metoprolol succinate (TOPROL-XL) 25 MG 24 hr tablet TAKE 1 TABLET BY MOUTH EVERY DAY 90 tablet 2  . omeprazole (PRILOSEC) 20 MG capsule     . rosuvastatin (CRESTOR) 10 MG tablet TAKE 1 TABLET BY MOUTH EVERY DAY 90 tablet 0  . Tapentadol HCl 100 MG TABS Take 100 mg by mouth daily as needed.    . thiamine (VITAMIN B-1) 100 MG tablet Take 100 mg by mouth daily.    Marland Kitchen triamcinolone cream (KENALOG) 0.1 % Apply 1 application topically 2 (two) times daily as needed. Legs 45 g 1  . valsartan (DIOVAN) 160 MG tablet TAKE 1 TABLET BY MOUTH EVERY DAY 90 tablet 1   No current facility-administered medications on file prior to  visit.   Past Medical History:  Diagnosis Date  . Anxiety   . Depression   . GERD (gastroesophageal reflux disease)   . Hypertension   . Peripheral neuropathy    Allergies  Allergen Reactions  . Epinephrine Other (See Comments) and Palpitations    "Heart Races Uncomfortably"     Social History   Socioeconomic History  . Marital status: Married    Spouse name: Not on file  . Number of children: 1  . Years of education: Not on file  .  Highest education level: Associate degree: academic program  Occupational History  . Occupation: retired  Tobacco Use  . Smoking status: Former Smoker    Types: Cigarettes  . Smokeless tobacco: Never Used  Vaping Use  . Vaping Use: Never used  Substance and Sexual Activity  . Alcohol use: Yes    Alcohol/week: 3.0 standard drinks    Types: 3 Glasses of wine per week  . Drug use: Not on file  . Sexual activity: Not on file  Other Topics Concern  . Not on file  Social History Narrative   Right handed   One story home   Drinks occasional caffeine   Social Determinants of Health   Financial Resource Strain: Low Risk   . Difficulty of Paying Living Expenses: Not hard at all  Food Insecurity: No Food Insecurity  . Worried About Programme researcher, broadcasting/film/video in the Last Year: Never true  . Ran Out of Food in the Last Year: Never true  Transportation Needs: No Transportation Needs  . Lack of Transportation (Medical): No  . Lack of Transportation (Non-Medical): No  Physical Activity: Inactive  . Days of Exercise per Week: 0 days  . Minutes of Exercise per Session: 0 min  Stress: No Stress Concern Present  . Feeling of Stress : Not at all  Social Connections: Moderately Isolated  . Frequency of Communication with Friends and Family: More than three times a week  . Frequency of Social Gatherings with Friends and Family: Never  . Attends Religious Services: Never  . Active Member of Clubs or Organizations: No  . Attends Banker Meetings: Never  . Marital Status: Married   Vitals:   03/16/21 1140  BP: 130/70  Pulse: (!) 104  Resp: 16  Temp: 98.5 F (36.9 C)  SpO2: 94%   Body mass index is 27.96 kg/m.  Physical Exam Vitals and nursing note reviewed.  Constitutional:      General: She is not in acute distress.    Appearance: She is well-developed.  HENT:     Head: Normocephalic and atraumatic.  Eyes:     Conjunctiva/sclera: Conjunctivae normal.  Cardiovascular:      Rate and Rhythm: Normal rate and regular rhythm.     Heart sounds: No murmur heard.     Comments: PT pulses present. Pulmonary:     Effort: Pulmonary effort is normal. No respiratory distress.     Breath sounds: Normal breath sounds.  Abdominal:     Palpations: Abdomen is soft. There is no mass.     Tenderness: There is no abdominal tenderness.  Lymphadenopathy:     Cervical: No cervical adenopathy.  Skin:    General: Skin is warm.     Findings: No erythema or rash.  Neurological:     Mental Status: She is alert and oriented to person, place, and time.     Comments: Unstable gait assisted by a cane.  Psychiatric:  Mood and Affect: Mood is anxious.   ASSESSMENT AND PLAN:  Ms.Faydra was seen today for follow-up.  Diagnoses and all orders for this visit: Orders Placed This Encounter  Procedures  . VAS US CAROTID   Neck pain on left side She cannot describe sensation, it is more like a discomfort moving from jaw to supraclavicular area. We have discussed possible etiologies. This could be related to her neuropathy. She is concerned about carotid artery disease causing symptom. Carotid US will be arranged.  Constipation Reporting great improvement. Explained that this is a chronic problem, she still may have periods of exacerbation. Chronic opioid use can certainly aggravate problem. Continue adequate fiber and fluid intake as well as OTC MiraLAX.  Peripheral neuropathy She reported improvement with gabapentin. We discussed some side effects. Gabapentin to be titrated from 100 mg 3 times daily to 200 mg and then 300 mg, increasing dose every 5 days as tolerated. Appropriate skin care, checking feet and avoiding trauma. Fall precautions also discussed.  Return in about 2 months (around 05/16/2021).  Cristyn Crossno G. [place], MD  [facility]. [place] office.  A few things to remember from today's visit:   Neck pain on left side - Plan: VAS US  CAROTID  Other polyneuropathy - Plan: gabapentin (NEURONTIN) 100 MG capsule  If you need refills please call your pharmacy. Do not use My Chart to request refills or for acute issues that need immediate attention.   Let continue increasing gabapentin as tolerated. Add another cap of Gabapentin 100 mg at night (200 mg) ,in 5 days add another one at noon,and 5 days later another one in the morning. Then we can try 300 mg , adding one cap at the time every 5 days as tolerated. Please be sure medication list is accurate. If a new problem present, please set up appointment sooner than planned today.

## 2021-03-17 ENCOUNTER — Encounter: Payer: Self-pay | Admitting: Family Medicine

## 2021-03-22 ENCOUNTER — Ambulatory Visit (HOSPITAL_COMMUNITY): Payer: Medicare PPO

## 2021-03-29 ENCOUNTER — Ambulatory Visit (HOSPITAL_COMMUNITY)
Admission: RE | Admit: 2021-03-29 | Discharge: 2021-03-29 | Disposition: A | Discharge status: Home / Self Care | Payer: Medicare PPO | Source: Ambulatory Visit | Attending: Family Medicine | Admitting: Family Medicine

## 2021-03-29 ENCOUNTER — Other Ambulatory Visit: Payer: Self-pay

## 2021-03-29 DIAGNOSIS — M542 Cervicalgia: Secondary | ICD-10-CM | POA: Insufficient documentation

## 2021-04-03 NOTE — Progress Notes (Signed)
I informed patient of their lab results and patient verbalized understanding.

## 2021-04-16 ENCOUNTER — Other Ambulatory Visit: Payer: Self-pay | Admitting: Family Medicine

## 2021-04-16 DIAGNOSIS — G6289 Other specified polyneuropathies: Secondary | ICD-10-CM

## 2021-04-21 ENCOUNTER — Ambulatory Visit
Admission: RE | Admit: 2021-04-21 | Discharge: 2021-04-21 | Disposition: A | Discharge status: Home / Self Care | Payer: Medicare PPO | Source: Ambulatory Visit | Attending: Family Medicine | Admitting: Family Medicine

## 2021-04-21 ENCOUNTER — Other Ambulatory Visit: Payer: Self-pay

## 2021-04-21 DIAGNOSIS — Z780 Asymptomatic menopausal state: Secondary | ICD-10-CM

## 2021-04-21 DIAGNOSIS — M810 Age-related osteoporosis without current pathological fracture: Secondary | ICD-10-CM | POA: Diagnosis not present

## 2021-05-09 ENCOUNTER — Other Ambulatory Visit: Payer: Self-pay | Admitting: Family Medicine

## 2021-05-09 ENCOUNTER — Other Ambulatory Visit: Payer: Self-pay

## 2021-05-09 DIAGNOSIS — M5416 Radiculopathy, lumbar region: Secondary | ICD-10-CM | POA: Diagnosis not present

## 2021-05-09 DIAGNOSIS — Z1211 Encounter for screening for malignant neoplasm of colon: Secondary | ICD-10-CM

## 2021-05-09 DIAGNOSIS — Z79899 Other long term (current) drug therapy: Secondary | ICD-10-CM | POA: Diagnosis not present

## 2021-05-09 DIAGNOSIS — G8929 Other chronic pain: Secondary | ICD-10-CM | POA: Diagnosis not present

## 2021-05-10 ENCOUNTER — Other Ambulatory Visit (INDEPENDENT_AMBULATORY_CARE_PROVIDER_SITE_OTHER): Payer: Medicare PPO

## 2021-05-10 DIAGNOSIS — Z1211 Encounter for screening for malignant neoplasm of colon: Secondary | ICD-10-CM | POA: Diagnosis not present

## 2021-05-10 LAB — FECAL OCCULT BLOOD, IMMUNOCHEMICAL: Fecal Occult Bld: NEGATIVE

## 2021-05-10 NOTE — Addendum Note (Signed)
Addended by: Elita Boone E on: 05/10/2021 11:15 AM   Modules accepted: Orders

## 2021-05-17 ENCOUNTER — Ambulatory Visit: Payer: Medicare PPO | Admitting: Family Medicine

## 2021-05-26 ENCOUNTER — Ambulatory Visit: Payer: Medicare PPO | Admitting: Family Medicine

## 2021-05-26 ENCOUNTER — Encounter: Payer: Self-pay | Admitting: Family Medicine

## 2021-05-26 ENCOUNTER — Other Ambulatory Visit: Payer: Self-pay

## 2021-05-26 VITALS — BP 144/80 | HR 99 | Resp 16 | Ht 65.5 in | Wt 180.4 lb

## 2021-05-26 DIAGNOSIS — M25571 Pain in right ankle and joints of right foot: Secondary | ICD-10-CM | POA: Diagnosis not present

## 2021-05-26 DIAGNOSIS — M816 Localized osteoporosis [Lequesne]: Secondary | ICD-10-CM

## 2021-05-26 DIAGNOSIS — F1020 Alcohol dependence, uncomplicated: Secondary | ICD-10-CM | POA: Diagnosis not present

## 2021-05-26 DIAGNOSIS — G8929 Other chronic pain: Secondary | ICD-10-CM

## 2021-05-26 DIAGNOSIS — M810 Age-related osteoporosis without current pathological fracture: Secondary | ICD-10-CM | POA: Insufficient documentation

## 2021-05-26 DIAGNOSIS — R600 Localized edema: Secondary | ICD-10-CM | POA: Diagnosis not present

## 2021-05-26 DIAGNOSIS — R011 Cardiac murmur, unspecified: Secondary | ICD-10-CM

## 2021-05-26 DIAGNOSIS — G6289 Other specified polyneuropathies: Secondary | ICD-10-CM

## 2021-05-26 DIAGNOSIS — I6521 Occlusion and stenosis of right carotid artery: Secondary | ICD-10-CM | POA: Diagnosis not present

## 2021-05-26 DIAGNOSIS — I10 Essential (primary) hypertension: Secondary | ICD-10-CM | POA: Diagnosis not present

## 2021-05-26 DIAGNOSIS — M542 Cervicalgia: Secondary | ICD-10-CM | POA: Diagnosis not present

## 2021-05-26 MED ORDER — HYDROCHLOROTHIAZIDE 25 MG PO TABS: 25.0000 mg | ORAL_TABLET | Freq: Every day | ORAL | 1 refills | Status: DC

## 2021-05-26 MED ORDER — GABAPENTIN 300 MG PO CAPS: 300.0000 mg | ORAL_CAPSULE | Freq: Three times a day (TID) | ORAL | 3 refills | Status: DC

## 2021-05-26 NOTE — Progress Notes (Addendum)
HPI: [name] Rebecca Baird is a 75 y.o. female with hx of chronic pain disorder, peripheral neuropathy,constipation,and HLD here today for follow up.   She was last seen on 03/16/21. Alcoholic peripheral neuropathy: Last visit Gabapentin dose was increased to 200 mg tid. She has tolerated medication well. Gabapentin has helped with upper and lower extremities numbness,tingling,and burning. She drinks 3 glasses of wine daily.  She has not identified exacerbating or alleviating factors. + Allodynia. Evaluated by neurologist, last seen 12/14/19. She [facility].   Generalized pain that seems to be worse today. Chronic pain disorder, follows with pain management.  HTN: She is on Valsartan 160 mg daily and Metoprolol succinate 25 mg daily.  She is concerned about elevated BP at home: 140-150's/80-90's, occasionally DBP 100's and SBP 110's-170's. Negative for severe/frequent headache, visual changes, chest pain, dyspnea, palpitation, or new focal weakness.  Lab Results  Component Value Date   CREATININE 1.31 (H) 02/08/2021   BUN 20 02/08/2021   NA 139 02/08/2021   K 4.8 02/08/2021   CL 105 02/08/2021   CO2 25 02/08/2021   She is very concerned about LE edema, usually RLE edema has been more pronounce but now left seems to be worse, similar to right. She has not noted erythema. LE edema seems the same in the morning and afternoon. States that LE elevation helps with pain but does not help with edema. She has compression stocking but she is not wearing them. Nocturia x 3-4. No orthopnea or PND.  "Lost hight", she had DEXA recently and would like to go through results. She is not taking Ca++. She is on Vit D 50 mcg (2000 U). DEXA 04/21/21:Osteoporotic according to World  Health Organization [facility]WHO) criteria  "My neck is killing me", L>R. She is having severe upper back/shoulders pain. No hx of trauma. She has used a neck collar,that helped  with pain but it does not fit now. She wonders if she needs a prescription for neck device to help with pain. She had carotid Duplex on 03/29/21:Right Carotid: Velocities in the right ICA are consistent with a 1-39%  stenosis. Non-hemodynamically significant plaque <50% noted in the CCA.  Left Carotid: Velocities in the left ICA are consistent with a 1-39% stenosis.  Vertebrals:  Bilateral vertebral arteries demonstrate antegrade flow.  Subclavians: Normal flow hemodynamics were seen in bilateral subclavian arteries.   Foot issues:She sees podiatrist but she does not feel like her concerns have been addressed. Due to feet deformities and pain she has difficulty finding shoes, she recently found a shoe store that has wider shoes. Frustrated because some of her shoes do not fit. Right ankle pain. She has had fractures with internal fixation, she feels like "pins are moving."Like something "popping out."  Right forearm skin lesion noted 1-2 weeks ago, "Little tiny bit" tender. Lesion is not pruritic. She is not aware of insect bites or trauma.  HLD: She is on Crestor 10 mg daily. She has not changed her diet, occasionally she eats fried food. She eats butter, "too much."  Lab Results  Component Value Date   CHOL 338 (H) 02/19/2020   HDL 53.50 02/19/2020   LDLCALC 252 (H) 02/19/2020   TRIG 162.0 (H) 02/19/2020   CHOLHDL 6 02/19/2020   Review of Systems  Constitutional:  Positive for fatigue. Negative for appetite change and fever.  HENT:  Negative for mouth sores, nosebleeds and sore throat.   Respiratory:  Negative for cough and wheezing.  Gastrointestinal:  Negative for abdominal pain, nausea and vomiting.       Negative for changes in bowel habits.  Endocrine: Negative for cold intolerance and heat intolerance.  Genitourinary:  Negative for decreased urine volume, dysuria and hematuria.  Musculoskeletal:  Positive for arthralgias, back pain and gait problem.  Neurological:  Negative  for syncope and facial asymmetry.  Psychiatric/Behavioral:  Negative for confusion and hallucinations. The patient is nervous/anxious.   Rest of ROS, see pertinent positives sand negatives in HPI  Current Outpatient Medications on File Prior to Visit  Medication Sig Dispense Refill   Ascorbic Acid (VITAMIN C) 1000 MG tablet Take 1,000 mg by mouth daily.     Cyanocobalamin (VITAMIN B12 PO) Take 5,000 mg by mouth daily.     desonide (DESOWEN) 0.05 % cream Apply topically daily as needed. Face 30 g 0   folic acid (FOLVITE) 800 MCG tablet Take 400 mcg by mouth daily.     furosemide (LASIX) 20 MG tablet TAKE 1 TABLET BY MOUTH EVERY DAY 90 tablet 1   indomethacin (INDOCIN) 50 MG capsule      metoprolol succinate (TOPROL-XL) 25 MG 24 hr tablet TAKE 1 TABLET BY MOUTH EVERY DAY 90 tablet 2   omeprazole (PRILOSEC) 20 MG capsule      rosuvastatin (CRESTOR) 10 MG tablet TAKE 1 TABLET BY MOUTH EVERY DAY 90 tablet 0   Tapentadol HCl 100 MG TABS Take 100 mg by mouth daily as needed.     thiamine (VITAMIN B-1) 100 MG tablet Take 100 mg by mouth daily.     triamcinolone cream (KENALOG) 0.1 % Apply 1 application topically 2 (two) times daily as needed. Legs 45 g 1   valsartan (DIOVAN) 160 MG tablet TAKE 1 TABLET BY MOUTH EVERY DAY 90 tablet 1   No current facility-administered medications on file prior to visit.   Past Medical History:  Diagnosis Date   Anxiety    Depression    GERD (gastroesophageal reflux disease)    Hypertension    Peripheral neuropathy    Allergies  Allergen Reactions   Epinephrine Other (See Comments) and Palpitations    "Heart Races Uncomfortably"     Social History   Socioeconomic History   Marital status: Married    Spouse name: Not on file   Number of children: 1   Years of education: Not on file   Highest education level: Associate degree: academic program  Occupational History   Occupation: retired  Tobacco Use   Smoking status: Former    Types: Cigarettes    Smokeless tobacco: Never  Vaping Use   Vaping Use: Never used  Substance and Sexual Activity   Alcohol use: Yes    Alcohol/week: 3.0 standard drinks    Types: 3 Glasses of wine per week   Drug use: Not on file   Sexual activity: Not on file  Other Topics Concern   Not on file  Social History Narrative   Right handed   One story home   Drinks occasional caffeine   Social Determinants of Health   Financial Resource Strain: Low Risk    Difficulty of Paying Living Expenses: Not hard at all  Food Insecurity: No Food Insecurity   Worried About Programme researcher, broadcasting/film/video in the Last Year: Never true   Ran Out of Food in the Last Year: Never true  Transportation Needs: No Transportation Needs   Lack of Transportation (Medical): No   Lack of Transportation (Non-Medical): No  Physical Activity:  Inactive   Days of Exercise per Week: 0 days   Minutes of Exercise per Session: 0 min  Stress: No Stress Concern Present   Feeling of Stress : Not at all  Social Connections: Moderately Isolated   Frequency of Communication with Friends and Family: More than three times a week   Frequency of Social Gatherings with Friends and Family: Never   Attends Religious Services: Never   Database administrator or Organizations: No   Attends Banker Meetings: Never   Marital Status: Married   Vitals:   05/26/21 1051  BP: (!) 144/80  Pulse: 99  Resp: 16  SpO2: 91%   Body mass index is 29.56 kg/m.  Physical Exam Vitals and nursing note reviewed.  Constitutional:      General: She is not in acute distress.    Appearance: She is well-developed.  HENT:     Head: Normocephalic and atraumatic.     Mouth/Throat:     Mouth: Mucous membranes are moist.     Pharynx: Oropharynx is clear.  Eyes:     Conjunctiva/sclera: Conjunctivae normal.  Cardiovascular:     Rate and Rhythm: Normal rate and regular rhythm.     Pulses:          Dorsalis pedis pulses are 2+ on the right side and 2+ on the  left side.     Heart sounds: Murmur (SEM I-II/VI LUSB) heard.  Pulmonary:     Effort: Pulmonary effort is normal. No respiratory distress.     Breath sounds: Normal breath sounds.  Abdominal:     Palpations: Abdomen is soft. There is no hepatomegaly or mass.     Tenderness: There is no abdominal tenderness.  Musculoskeletal:     Right lower leg: 2+ Pitting Edema present.     Left lower leg: 2+ Pitting Edema present.  Lymphadenopathy:     Cervical: No cervical adenopathy.  Skin:    General: Skin is warm.     Findings: Lesion present. No erythema or rash.       Neurological:     General: No focal deficit present.     Mental Status: She is alert and oriented to person, place, and time.     Cranial Nerves: No cranial nerve deficit.  Psychiatric:        Mood and Affect: Mood is anxious. Affect is labile.     Comments: Well groomed, good eye contact.   ASSESSMENT AND PLAN:  Ms. Rebecca Baird was seen today for follow-up.  Orders Placed This Encounter  Procedures   Lipid panel   Brain Natriuretic Peptide   Basic metabolic panel   Ambulatory referral to Orthopedic Surgery   ECHOCARDIOGRAM COMPLETE   Other polyneuropathy Gabapentin has helped. Gabapentin dose increased from 200 mg tid to 300 mg tid. Fall precautions. Appropriate foot care discussed.  -     gabapentin (NEURONTIN) 300 MG capsule; Take 1 capsule (300 mg total) by mouth 3 (three) times daily.  Stenosis of right carotid artery We discussed carotid duplex result. Aspirin 81 mg recommended, we discussed some side effects and the risk of interaction with indomethacin. Continue Crestor 10 mg daily. LDL goal < 70.  Chronic neck pain This is a chronic problem, most likely caused by DDD. She would like to be evaluated by ortho. Referral placed. Continue following with pain management.  Chronic pain of right ankle Chronic. She is not satisfy with podiatrist care, so would like to see ortho.  Heart  murmur She has heart murmur since childhood, she has not had an echo before. Today it seems to be lower. Echo will be arranged. Instructed about warning signs.  Bilateral lower extremity edema We discussed possible etiologies. Vein insufficiency most likely. Compression stocking and LE elevation recommended. Appropriate skin care. HCTZ 25 mg added today.  -     hydrochlorothiazide (HYDRODIURIL) 25 MG tablet; Take 1 tablet (25 mg total) by mouth daily.  Uncomplicated alcohol dependence (HCC) She understands adverse effects of high alcohol intake. Decreased alcohol intake. LFT's in 01/2021 normal.  Benign essential hypertension BP rechecked 170/89 and with her BP monitor 173/93. HCTZ 25 mg added today. No changes in Metoprolol succinate or valsartan dose. Low salt diet.  Localized osteoporosis without current pathological fracture We discussed treatment option, Fosamax is the first line of treatment. We reviewed some side effects of medication. Recommend completing dental procedures, if any needed. Fall precautions. Weightbearing exercises 2-3 times per week + low impact regular physical activity. DEXA can be repeated in 2 to 3 years.  Spent 54 minutes.  During this time history was obtained and documented, examination was performed, prior labs/imaging reviewed, and assessment/plan discussed. In regard to skin lesion on right forearm, it has been a week, so recommend continue monitoring and if it doe snot heal,derma referral will be considered.  Return in about 2 months (around 07/27/2021).  Dura Mccormack G. [place], MD  [facility]. [place] office.

## 2021-05-26 NOTE — Patient Instructions (Addendum)
A few things to remember from today's visit:  Other polyneuropathy - Plan: gabapentin (NEURONTIN) 300 MG capsule  Stenosis of right carotid artery - Plan: Lipid panel  Chronic neck pain - Plan: Ambulatory referral to Orthopedic Surgery  Chronic pain of right ankle - Plan: Ambulatory referral to Orthopedic Surgery  Heart murmur - Plan: ECHOCARDIOGRAM COMPLETE  Bilateral lower extremity edema - Plan: ECHOCARDIOGRAM COMPLETE, Brain Natriuretic Peptide, Basic metabolic panel, hydrochlorothiazide (HYDRODIURIL) 25 MG tablet  If you need refills please call your pharmacy. Do not use My Chart to request refills or for acute issues that need immediate attention.   Gabapentin increased from 200 mg to 300 mg 3 times per day. Continue monitoring skin lesion, if not improving in 3 weeks you may need to have a biopsy, so dermatologist will need to be placed.  Continue Crestor 10 mg. We can start Aspirin 81 mg daily, enteric coated. Caution with indomethacin. Hydrochlorothiazide 25 mg added today for blood pressure. Be sure to eat things with potassium, bananas for example.  Please be sure medication list is accurate. If a new problem present, please set up appointment sooner than planned today.

## 2021-05-31 ENCOUNTER — Telehealth: Payer: Self-pay | Admitting: Family Medicine

## 2021-05-31 MED ORDER — ALENDRONATE SODIUM 70 MG PO TABS: 70.0000 mg | ORAL_TABLET | ORAL | 2 refills | Status: DC

## 2021-05-31 NOTE — Addendum Note (Signed)
Addended by: [place], Deren Degrazia G on: 05/31/2021 02:03 PM   Modules accepted: Orders

## 2021-05-31 NOTE — Telephone Encounter (Signed)
Please advise on which medication it was, I didn't see it in your note.

## 2021-05-31 NOTE — Telephone Encounter (Signed)
I called and left patient a voicemail letting her know that the Rx has been sent in.

## 2021-05-31 NOTE — Telephone Encounter (Signed)
Pt is calling in stating that she is wondering about a pill that Dr. [place] discussed at her last visit that sh would take it once a week and that she should not lie down after taking it b/c it can come back up through her esophagus.  Pt stated that it was not at her pharmacy and was just wondering if it was just a discussion or something that she was to pick up.  Pt would like to have a call back.

## 2021-05-31 NOTE — Telephone Encounter (Signed)
I think she is referring to Fosamax. Rx sent. Thanks, BJ

## 2021-06-05 ENCOUNTER — Ambulatory Visit (INDEPENDENT_AMBULATORY_CARE_PROVIDER_SITE_OTHER): Payer: Medicare PPO

## 2021-06-05 ENCOUNTER — Ambulatory Visit: Payer: Medicare PPO | Admitting: Orthopedic Surgery

## 2021-06-05 DIAGNOSIS — M542 Cervicalgia: Secondary | ICD-10-CM

## 2021-06-05 DIAGNOSIS — M25571 Pain in right ankle and joints of right foot: Secondary | ICD-10-CM

## 2021-06-05 DIAGNOSIS — M4722 Other spondylosis with radiculopathy, cervical region: Secondary | ICD-10-CM

## 2021-06-05 MED ORDER — PREDNISONE 10 MG PO TABS: 10.0000 mg | ORAL_TABLET | Freq: Every day | ORAL | 0 refills | Status: DC

## 2021-06-06 ENCOUNTER — Encounter: Payer: Self-pay | Admitting: Orthopedic Surgery

## 2021-06-06 NOTE — Progress Notes (Signed)
Office Visit Note   Patient: Rebecca Baird           Date of Birth: 1946-02-17           MRN: [id number] Visit Date: 06/05/2021              Requested by: [place], Betty G, MD [address] [place],  [place] [address] PCP: [place], Betty G, MD  Chief Complaint  Patient presents with   Neck - Pain   Right Ankle - Pain      HPI: Patient is a 75 year old woman who is seen for initial evaluation for degenerative disc disease of her cervical spine and right ankle pain.  Patient states she had previous surgery of the right ankle and Rande Lawman about 2 years ago she feels like the screws are coming out.  Patient also complains of swelling in both legs.  Patient also has chronic neck pain she states with certain positions of her neck she has radicular pain into all 4 extremities.  #3 patient also has a history of Dupuytren's contractures in both hands she states she has had injections in the past that did not work.  She is on 300 mg of Neurontin 3 times a day for the radicular symptoms.  Assessment & Plan: Visit Diagnoses:  1. Pain in right ankle and joints of right foot   2. Neck pain   3. Osteoarthritis of spine with radiculopathy, cervical region     Plan: We will call in a low-dose prednisone to help with the degenerative cervical disc disease.  Recommended compression socks for the pitting edema.  Recommended follow-up with [facility] hand surgery for the Dupuytren's  Follow-Up Instructions: Return if symptoms worsen or fail to improve.   Ortho Exam  Patient is alert, oriented, no adenopathy, well-dressed, normal affect, normal respiratory effort. Examination patient has no focal motor weakness in either upper or lower extremity.  She has very limited range of motion of her cervical spine only about 10 degrees of flexion and rotation.  Examination of her hands she does have Dupuytren's contractures with a history of previous injections.  Examination of her ankle there is  swelling and pitting edema with her calf measuring 39 cm in circumference there is no redness no cellulitis no signs of infection.  The internal fixation for her bimalleolar fracture is stable and intact with a congruent mortise.    Imaging: No results found. No images are attached to the encounter.  Labs: No results found for: HGBA1C, ESRSEDRATE, CRP, LABURIC, REPTSTATUS, GRAMSTAIN, CULT, LABORGA   Lab Results  Component Value Date   ALBUMIN 4.5 02/08/2021   ALBUMIN 4.4 02/19/2020   ALBUMIN 4.6 09/21/2019    No results found for: MG No results found for: VD25OH  No results found for: PREALBUMIN CBC EXTENDED Latest Ref Rng & Units 02/08/2021  WBC 4.0 - 10.5 K/uL 8.1  RBC 3.87 - 5.11 Mil/uL 3.62(L)  HGB 12.0 - 15.0 g/dL 11.9(L)  HCT 36.0 - 46.0 % 35.5(L)  PLT 150.0 - 400.0 K/uL 220.0  NEUTROABS 1.4 - 7.7 K/uL 5.7  LYMPHSABS 0.7 - 4.0 K/uL 1.6     There is no height or weight on file to calculate BMI.  Orders:  Orders Placed This Encounter  Procedures   XR Ankle Complete Right   XR Cervical Spine 2 or 3 views   Meds ordered this encounter  Medications   predniSONE (DELTASONE) 10 MG tablet    Sig: Take 1 tablet (10 mg  total) by mouth daily with breakfast.    Dispense:  30 tablet    Refill:  0     Procedures: No procedures performed  Clinical Data: No additional findings.  ROS:  All other systems negative, except as noted in the HPI. Review of Systems  Objective: Vital Signs: There were no vitals taken for this visit.  Specialty Comments:  No specialty comments available.  PMFS History: Patient Active Problem List   Diagnosis Date Noted   Stenosis of right carotid artery 05/26/2021   Osteoporosis 05/26/2021   Constipation 03/16/2021   Alcoholic peripheral neuropathy (HCC) 02/08/2021   Seborrheic dermatitis, unspecified 02/19/2020   Unstable gait 10/20/2019   Dupuytren's contracture of right hand 09/29/2019   Chronic pain disorder 09/22/2019    Abnormal LFTs (liver function tests) 09/22/2019   Peripheral neuropathy 09/22/2019   History of syncope 09/21/2019   Benign essential hypertension 09/21/2019   Bilateral lower extremity edema 09/21/2019   Alcohol dependency (HCC) 09/18/2018   Closed right trimalleolar fracture 09/18/2018   Depression 09/18/2018   Fall 09/18/2018   Syncope 09/18/2018   Nasal bones, closed fracture 08/07/2018   Hyperlipemia, mixed 04/04/2016   Low grade squamous intraepithelial lesion on cytologic smear of cervix (LGSIL) 04/04/2016   Past Medical History:  Diagnosis Date   Anxiety    Depression    GERD (gastroesophageal reflux disease)    Hypertension    Peripheral neuropathy     Family History  Problem Relation Age of Onset   Breast cancer Mother    Breast cancer Cousin     Past Surgical History:  Procedure Laterality Date   broken ankle     cataract surgery     NOSE SURGERY     Social History   Occupational History   Occupation: retired  Tobacco Use   Smoking status: Former    Types: Cigarettes   Smokeless tobacco: Never  Vaping Use   Vaping Use: Never used  Substance and Sexual Activity   Alcohol use: Yes    Alcohol/week: 3.0 standard drinks    Types: 3 Glasses of wine per week   Drug use: Not on file   Sexual activity: Not on file

## 2021-06-13 ENCOUNTER — Other Ambulatory Visit: Payer: Medicare PPO

## 2021-06-27 ENCOUNTER — Other Ambulatory Visit: Payer: Self-pay

## 2021-06-27 ENCOUNTER — Other Ambulatory Visit (INDEPENDENT_AMBULATORY_CARE_PROVIDER_SITE_OTHER): Payer: Medicare PPO

## 2021-06-27 DIAGNOSIS — R600 Localized edema: Secondary | ICD-10-CM

## 2021-06-27 DIAGNOSIS — I6521 Occlusion and stenosis of right carotid artery: Secondary | ICD-10-CM

## 2021-06-27 LAB — LIPID PANEL
Cholesterol: 213 mg/dL — ABNORMAL HIGH (ref 0–200)
HDL: 79.4 mg/dL (ref >39.00)
LDL Cholesterol: 97 mg/dL (ref 0–99)
NonHDL: 133.85
Total CHOL/HDL Ratio: 3
Triglycerides: 184 mg/dL — ABNORMAL HIGH (ref 0.0–149.0)
VLDL: 36.8 mg/dL (ref 0.0–40.0)

## 2021-06-27 LAB — BASIC METABOLIC PANEL
BUN: 32 mg/dL — ABNORMAL HIGH (ref 6–23)
CO2: 29 mEq/L (ref 19–32)
Calcium: 9.6 mg/dL (ref 8.4–10.5)
Chloride: 100 mEq/L (ref 96–112)
Creatinine, Ser: 1.27 mg/dL — ABNORMAL HIGH (ref 0.40–1.20)
GFR: 41.49 mL/min — ABNORMAL LOW (ref >60.00)
Glucose, Bld: 73 mg/dL (ref 70–99)
Potassium: 4.6 mEq/L (ref 3.5–5.1)
Sodium: 137 mEq/L (ref 135–145)

## 2021-06-27 LAB — BRAIN NATRIURETIC PEPTIDE: Pro B Natriuretic peptide (BNP): 46 pg/mL (ref 0.0–100.0)

## 2021-06-30 ENCOUNTER — Other Ambulatory Visit: Payer: Self-pay | Admitting: Family Medicine

## 2021-06-30 DIAGNOSIS — L219 Seborrheic dermatitis, unspecified: Secondary | ICD-10-CM

## 2021-07-06 DIAGNOSIS — G8929 Other chronic pain: Secondary | ICD-10-CM | POA: Diagnosis not present

## 2021-07-06 DIAGNOSIS — M5416 Radiculopathy, lumbar region: Secondary | ICD-10-CM | POA: Diagnosis not present

## 2021-07-08 ENCOUNTER — Other Ambulatory Visit: Payer: Self-pay | Admitting: Family Medicine

## 2021-07-08 ENCOUNTER — Other Ambulatory Visit: Payer: Self-pay | Admitting: Orthopedic Surgery

## 2021-07-08 DIAGNOSIS — G6289 Other specified polyneuropathies: Secondary | ICD-10-CM

## 2021-07-11 ENCOUNTER — Encounter: Payer: Self-pay | Admitting: Family Medicine

## 2021-07-11 ENCOUNTER — Ambulatory Visit: Payer: Medicare PPO | Admitting: Family Medicine

## 2021-07-11 ENCOUNTER — Other Ambulatory Visit: Payer: Self-pay

## 2021-07-11 VITALS — BP 130/70 | HR 100 | Ht 65.5 in | Wt 179.0 lb

## 2021-07-11 DIAGNOSIS — G8929 Other chronic pain: Secondary | ICD-10-CM

## 2021-07-11 DIAGNOSIS — M4722 Other spondylosis with radiculopathy, cervical region: Secondary | ICD-10-CM

## 2021-07-11 DIAGNOSIS — R2681 Unsteadiness on feet: Secondary | ICD-10-CM | POA: Diagnosis not present

## 2021-07-11 DIAGNOSIS — I10 Essential (primary) hypertension: Secondary | ICD-10-CM

## 2021-07-11 DIAGNOSIS — G6289 Other specified polyneuropathies: Secondary | ICD-10-CM | POA: Diagnosis not present

## 2021-07-11 DIAGNOSIS — E782 Mixed hyperlipidemia: Secondary | ICD-10-CM | POA: Diagnosis not present

## 2021-07-11 DIAGNOSIS — L821 Other seborrheic keratosis: Secondary | ICD-10-CM

## 2021-07-11 DIAGNOSIS — R600 Localized edema: Secondary | ICD-10-CM | POA: Diagnosis not present

## 2021-07-11 MED ORDER — PREDNISONE 10 MG PO TABS: 10.0000 mg | ORAL_TABLET | Freq: Every day | ORAL | 0 refills | Status: DC

## 2021-07-11 NOTE — Progress Notes (Signed)
HPI:  Chief Complaint  Patient presents with   Medication Management    prednisone   [name] is a 75 y.o. female, who is here today for chronic disease management. Last seen on 05/26/21. She has a few concerns today. She also had blood work recently.  She recently saw Dr Lajoyce Corners  for cervical and ankle pain. She was started on Prednisone 10 mg daily and not sure about when she needs to follow-up.  Feeling more energetic since she started prednisone, "happier." Requesting refills for Prednisone until f/u appt. No side effects reported.  LE edema has improved. She is on Furosemide 20 mg daily. She is not wearing compression stockings.  To follow on heart murmur, echo was ordered a couple on months ago. She just learned that her health insurance approved echo, concerned because she has not received appt information. Negative for CP,SOB,palpitations,orthopnea and PND. HTN on valsartan 160 mg,Metoprolol succinate 25 mg daily,and HCTZ 25 mg daily. Last OV BP was mildly elevated, 144/80,pain could have been a contributing factor.  Lab Results  Component Value Date   CREATININE 1.27 (H) 06/27/2021   BUN 32 (H) 06/27/2021   NA 137 06/27/2021   K 4.6 06/27/2021   CL 100 06/27/2021   CO2 29 06/27/2021   HLD: She is on Crestor 10 mg daily. She is tolerating medication well.  Lab Results  Component Value Date   CHOL 213 (H) 06/27/2021   HDL 79.40 06/27/2021   LDLCALC 97 06/27/2021   TRIG 184.0 (H) 06/27/2021   CHOLHDL 3 06/27/2021   Chronic pain: Since her last visit she has followed with pain clinic and Indomethacin was discontinued. She has noted more joint soreness but not significant. Overall,pain has improved since Gabapentin was started, 3/10. She is on Nucynta ER 100 mg bid.  Last visit, she was concerned about right wrist skin lesion, reporting that it is improved "so much." Concerned about other skin lesions. Hyperpigmented skin lesions scattered on  upper extremities. Not tender or pruritic, no easy bleeding.  Peripheral neuropathy: Last visit Gabapentin was adjusted. She has tolerated Gabapentin 300 mg tid well. Water aerobics has helped with myalgias and arthralgias.  A week ago burn 4th finger when taking shrimp from oven, did not feel it when she first touched pan. Area is healing well, she has not used OTC medications.  Shower chair broke while she was seated on it after showering. No serious harm. She needs a Rx, she would like a small one,similar to the one that broke.  Review of Systems  Constitutional:  Negative for activity change, appetite change and fever.  HENT:  Negative for mouth sores, nosebleeds and sore throat.   Eyes:  Negative for redness and visual disturbance.  Respiratory:  Negative for cough and wheezing.   Gastrointestinal:  Negative for abdominal pain, nausea and vomiting.       Negative for changes in bowel habits.  Genitourinary:  Negative for decreased urine volume, dysuria and hematuria.  Musculoskeletal:  Positive for arthralgias, back pain and gait problem.  Neurological:  Negative for syncope, weakness and headaches.  Psychiatric/Behavioral:  Negative for confusion. The patient is nervous/anxious.   Rest of ROS see pertinent positives and negatives in HPI.  Current Outpatient Medications on File Prior to Visit  Medication Sig Dispense Refill   alendronate (FOSAMAX) 70 MG tablet Take 1 tablet (70 mg total) by mouth every 7 (seven) days. Take with a full glass of water on an empty stomach. 13 tablet  2   Ascorbic Acid (VITAMIN C) 1000 MG tablet Take 1,000 mg by mouth daily.     Cyanocobalamin (VITAMIN B12 PO) Take 5,000 mg by mouth daily.     desonide (DESOWEN) 0.05 % cream APPLY TO FACE TOPICALLY DAILY AS NEEDED 30 g 0   folic acid (FOLVITE) 800 MCG tablet Take 400 mcg by mouth daily.     furosemide (LASIX) 20 MG tablet TAKE 1 TABLET BY MOUTH EVERY DAY 90 tablet 1   gabapentin (NEURONTIN) 300 MG  capsule Take 1 capsule (300 mg total) by mouth 3 (three) times daily. 90 capsule 3   hydrochlorothiazide (HYDRODIURIL) 25 MG tablet Take 1 tablet (25 mg total) by mouth daily. 90 tablet 1   metoprolol succinate (TOPROL-XL) 25 MG 24 hr tablet TAKE 1 TABLET BY MOUTH EVERY DAY 90 tablet 2   omeprazole (PRILOSEC) 20 MG capsule      rosuvastatin (CRESTOR) 10 MG tablet TAKE 1 TABLET BY MOUTH EVERY DAY 90 tablet 0   tapentadol (NUCYNTA ER) 100 MG 12 hr tablet Take 100 mg by mouth every 12 (twelve) hours.     thiamine (VITAMIN B-1) 100 MG tablet Take 100 mg by mouth daily.     triamcinolone cream (KENALOG) 0.1 % Apply 1 application topically 2 (two) times daily as needed. Legs 45 g 1   valsartan (DIOVAN) 160 MG tablet TAKE 1 TABLET BY MOUTH EVERY DAY 90 tablet 1   No current facility-administered medications on file prior to visit.   Past Medical History:  Diagnosis Date   Anxiety    Depression    GERD (gastroesophageal reflux disease)    Hypertension    Peripheral neuropathy    Allergies  Allergen Reactions   Epinephrine Other (See Comments) and Palpitations    "Heart Races Uncomfortably"    Social History   Socioeconomic History   Marital status: Married    Spouse name: Not on file   Number of children: 1   Years of education: Not on file   Highest education level: Associate degree: academic program  Occupational History   Occupation: retired  Tobacco Use   Smoking status: Former    Types: Cigarettes   Smokeless tobacco: Never  Vaping Use   Vaping Use: Never used  Substance and Sexual Activity   Alcohol use: Yes    Alcohol/week: 3.0 standard drinks    Types: 3 Glasses of wine per week   Drug use: Not on file   Sexual activity: Not on file  Other Topics Concern   Not on file  Social History Narrative   Right handed   One story home   Drinks occasional caffeine   Social Determinants of Health   Financial Resource Strain: Low Risk    Difficulty of Paying Living  Expenses: Not hard at all  Food Insecurity: No Food Insecurity   Worried About Programme researcher, broadcasting/film/video in the Last Year: Never true   Ran Out of Food in the Last Year: Never true  Transportation Needs: No Transportation Needs   Lack of Transportation (Medical): No   Lack of Transportation (Non-Medical): No  Physical Activity: Inactive   Days of Exercise per Week: 0 days   Minutes of Exercise per Session: 0 min  Stress: No Stress Concern Present   Feeling of Stress : Not at all  Social Connections: Moderately Isolated   Frequency of Communication with Friends and Family: More than three times a week   Frequency of Social Gatherings with Friends and  Family: Never   Attends Religious Services: Never   Active Member of Clubs or Organizations: No   Attends Banker Meetings: Never   Marital Status: Married    Today's Vitals   07/11/21 1355  BP: 130/70  Pulse: 100  SpO2: 95%  Weight: 179 lb (81.2 kg)  Height: 5' 5.5" (1.664 m)   Body mass index is 29.33 kg/m.  Physical Exam  Nursing note and vitals reviewed. Constitutional: She is oriented to person, place, and time. She appears well-developed. No distress.  HENT:  Head: Normocephalic and atraumatic.  Mouth/Throat: Oropharynx is clear and moist and mucous membranes are normal.  Eyes: Conjunctivae are normal.  Cardiovascular: Normal rate and regular rhythm.  SEM I-II/VI RUSB and LUSB murmur heard. Pulses:      Dorsalis pedis pulses are 2+ on the right side, and 2+ on the left side.  Respiratory: Effort normal and breath sounds normal. No respiratory distress.  GI: Soft. She exhibits no mass.There is no tenderness.  Musculoskeletal: LE trace pitting LE edema,bilateral. Antalgic gait.No signs of synovitis. Lymphadenopathy:    She has no cervical adenopathy.  Neurological: She is alert and oriented to person, place, and time. She has normal strength. No cranial nerve deficit. Gait is unstable, assisted by a cane.  Skin:  Skin is warm. No rash noted. No erythema. Right forearm brownish ,rough,raised lesion, 3 mm. Erythematous papular lesion dorsal aspect of right wrist, 2 mm. No associated induration or tenderness. No suspicious lesions appreciated. Psychiatric: She has a normal mood and affect.  Well groomed, good eye contact.   ASSESSMENT AND PLAN:  Ms.Skya was seen today for medication management.  Diagnoses and all orders for this visit:  Other polyneuropathy Improved with Gabapentin, continue 300 mg tid. Some side effects discussed. Trauma prevention discussed.  Hyperlipemia, mixed Still mildly elevated but improved. Continue Crestor 10 mg daily and low fat diet.  Osteoarthritis of spine with radiculopathy, cervical region Generalized pain and this problem have improved with daily Prednisone. We discussed some side effects. Not sure about plan, she was instructed to arrange appt with ortho for follow up. Prednisone prescription sent to her pharmacy, she will need to start taping medication down.   -     PredniSONE (DELTASONE) 10 MG tablet; Take 1 tablet (10 mg total) by mouth daily with breakfast for 7 days.  Unstable gait Fall precautions discussed. Some of her medications can aggravate problem, we reviewed side effects of some of her medications. Rx for shower chair given.  Seborrheic keratosis Educated about Dx and prognosis. Reassured. Continue monitoring for changes.  Benign essential hypertension BP better controlled. Continue current management: Valsartan 160 mg daily,Metoprolol Succinate 25 mg daily,and HCTZ 25 mg daily. DASH/low salt diet to continue. Monitor BP at home.  Bilateral lower extremity edema Improved. Continue Furosemide 20 mg daily. Compression stocking will also help.  I spent a total of 41 minutes in both face to face and non face to face activities for this visit on the date of this encounter. During this time history was obtained and documented,  examination was performed, prior labs reviewed, and assessment/plan discussed. Echo scheduled for 08/01/21.  Return in about 4 months (around 11/08/2021) for Please move next appt to lare 10/2021.Marland Kitchen  Breaunna Gottlieb [place], MD [facility]. [facility].

## 2021-07-11 NOTE — Patient Instructions (Addendum)
A few things to remember from today's visit:  Other polyneuropathy  Hyperlipemia, mixed  Benign essential hypertension  If you need refills please call your pharmacy. No changes today.  Small amount of Prednisone to your pharmacy, please contact Dr Audrie Lia office. Fall precautions.  Do not use My Chart to request refills or for acute issues that need immediate attention.    Please be sure medication list is accurate. If a new problem present, please set up appointment sooner than planned today.

## 2021-07-12 ENCOUNTER — Other Ambulatory Visit (HOSPITAL_COMMUNITY): Payer: Medicare PPO

## 2021-07-12 ENCOUNTER — Telehealth: Payer: Self-pay | Admitting: Orthopedic Surgery

## 2021-07-12 ENCOUNTER — Other Ambulatory Visit: Payer: Self-pay | Admitting: Physician Assistant

## 2021-07-12 NOTE — Telephone Encounter (Signed)
Patient needs a follow up hasnt been seen in a month and prdnisone is not a longg term medication

## 2021-07-12 NOTE — Telephone Encounter (Signed)
Pt called requesting refill of prednisone. Pt states her PCP prescribe her a few to last for 2 days. Please send script to pharmacy on file. Please call pt at [phone].

## 2021-07-14 ENCOUNTER — Ambulatory Visit: Payer: Medicare PPO | Admitting: Family

## 2021-07-14 ENCOUNTER — Telehealth: Payer: Self-pay | Admitting: Family Medicine

## 2021-07-14 DIAGNOSIS — M542 Cervicalgia: Secondary | ICD-10-CM | POA: Diagnosis not present

## 2021-07-14 MED ORDER — PREDNISONE 10 MG PO TABS: 10.0000 mg | ORAL_TABLET | Freq: Every day | ORAL | 0 refills | Status: DC

## 2021-07-14 MED ORDER — OMEPRAZOLE 20 MG PO CPDR: 20.0000 mg | DELAYED_RELEASE_CAPSULE | Freq: Every day | ORAL | 2 refills | Status: DC

## 2021-07-14 MED ORDER — PREDNISONE 10 MG PO TABS: 10.0000 mg | ORAL_TABLET | Freq: Every day | ORAL | 2 refills | Status: DC

## 2021-07-14 NOTE — Telephone Encounter (Signed)
Rx sent in

## 2021-07-14 NOTE — Telephone Encounter (Signed)
Patient called needing a refill on omeprazole (PRILOSEC) 20 MG capsule   Patient states she is out of prescription and wants to know if there is any way Dr.Jordan can fill it and have the prescription transferred over to her.    Good callback number is [phone]  Please Advise

## 2021-07-17 ENCOUNTER — Other Ambulatory Visit: Payer: Self-pay | Admitting: Family Medicine

## 2021-07-17 DIAGNOSIS — I10 Essential (primary) hypertension: Secondary | ICD-10-CM

## 2021-07-21 ENCOUNTER — Telehealth: Payer: Self-pay

## 2021-07-21 NOTE — Telephone Encounter (Signed)
I called and spoke with patient. She is feeling better this morning. The pain is on her right side, she was doing laundry last night and had to bend over to move the laundry over. Patient wants to hold off on an x-ray right now, but if the pain comes back or gets worse over the weekend, she will go to urgent care. Patient will also call back if anything changes.      [facility] - Client TELEPHONE ADVICE RECORD Access[employer]urse Patient Name: Rebecca Baird Gender: Female DOB: 05/14/1946 Age: 75 Y 2 M Return Phone Number: [phone] (Primary), [phone] (Secondary) Address: City/ State/ Zip: [place]  [address] Client  [facility] - Client Client Site  [facility] - Night Physician [place], Betty - MD Contact Type Call Who Is Calling Patient / Member / Family / Caregiver Call Type Triage / Clinical Relationship To Patient Self Return Phone Number [phone] (Primary) Chief Complaint CHEST PAIN - pain, pressure, heaviness or tightness Reason for Call Symptomatic / Request for Health Information Initial Comment Caller states yesterday she thinks she broke her rib. She was bending over getting laundry. She took some pain medication and was able to sleep last night. She states right now it is 9/10. Translation No Nurse Assessment Nurse: Mayford Knife, RN, [name] Date/Time (Eastern Time): 07/21/2021 7:41:49 AM Confirm and document reason for call. If symptomatic, describe symptoms. ---bent over yesterday and feels like she has broke rib. having severe pain. Does the patient have any new or worsening symptoms? ---Yes Will a triage be completed? ---Yes Related visit to physician within the last 2 weeks? ---No Does the PT have any chronic conditions? (i.e. diabetes, asthma, this includes High risk factors for pregnancy, etc.) ---Yes List chronic conditions. ---diabetes, asthma, HTN, osteoporosis, bone  density? arthritis, edema Is this a behavioral health or substance abuse call? ---No Guidelines Guideline Title Affirmed Question Affirmed Notes Nurse Date/Time (Eastern Time) Chest Injury - Bending Lifting or Twisting SEVERE chest or rib pain (e.g., excruciating, unable to do any normal activities) Mayford Knife, RN, [name] 07/21/2021 7:45:28 AM PLEASE NOTE: All timestamps contained within this report are represented as [place] Standard Time. CONFIDENTIALTY NOTICE: This fax transmission is intended only for the addressee. It contains information that is legally privileged, confidential or otherwise protected from use or disclosure. If you are not the intended recipient, you are strictly prohibited from reviewing, disclosing, copying using or disseminating any of this information or taking any action in reliance on or regarding this information. If you have received this fax in error, please notify us immediately by telephone so that we can arrange for its return to [place]. Phone: [phone], Toll-Free: [phone], Fax: [phone] Page: 2 of 2 Call Id: [id number] Disp. Time Lamount Cohen Time) Disposition Final User 07/21/2021 7:38:41 AM Send to Urgent Jeani Sow 07/21/2021 7:52:25 AM Go to ED Now (or PCP triage) Yes Mayford Knife, RN, [name] Caller Disagree/Comply Comply Caller Understands Yes PreDisposition Call Doctor Care Advice Given Per Guideline GO TO ED NOW (OR PCP TRIAGE): * IF NO PCP (PRIMARY CARE PROVIDER) SECOND-LEVEL TRIAGE: You need to be seen within the next hour. Go to the ED/UCC at _____________ Hospital. Leave as soon as you can. CARE ADVICE given per Chest Injury - Bending, Lifting, or Twisting (Adult) guideline. Referrals REFERRED TO PCP OFFIC

## 2021-07-28 ENCOUNTER — Ambulatory Visit: Payer: Medicare PPO | Admitting: Family Medicine

## 2021-07-29 ENCOUNTER — Other Ambulatory Visit: Payer: Self-pay | Admitting: Family Medicine

## 2021-07-29 DIAGNOSIS — R600 Localized edema: Secondary | ICD-10-CM

## 2021-08-01 ENCOUNTER — Ambulatory Visit (HOSPITAL_COMMUNITY): Payer: Medicare PPO | Attending: Cardiology

## 2021-08-01 ENCOUNTER — Encounter: Payer: Self-pay | Admitting: Family

## 2021-08-01 ENCOUNTER — Other Ambulatory Visit: Payer: Self-pay

## 2021-08-01 DIAGNOSIS — R011 Cardiac murmur, unspecified: Secondary | ICD-10-CM | POA: Insufficient documentation

## 2021-08-01 DIAGNOSIS — R600 Localized edema: Secondary | ICD-10-CM | POA: Insufficient documentation

## 2021-08-01 LAB — ECHOCARDIOGRAM COMPLETE
AR max vel: 1.85 cm2
AV Area VTI: 1.99 cm2
AV Area mean vel: 1.76 cm2
AV Mean grad: 15 mmHg
AV Peak grad: 26.7 mmHg
Ao pk vel: 2.58 m/s
Area-P 1/2: 3.8 cm2
S' Lateral: 2.6 cm

## 2021-08-01 NOTE — Progress Notes (Signed)
Office Visit Note   Patient: Rebecca Baird           Date of Birth: 30-May-1946           MRN: [id number] Visit Date: 07/14/2021              Requested by: [place], Betty G, MD [address] [place],  [place] [address] PCP: [place], Betty G, MD  Chief Complaint  Patient presents with   Neck - Pain      HPI: The patient is a 74 year old woman who presents today in routine follow-up.  She is seen primarily for her neck pain. Patient also has chronic neck pain she states with certain positions of her neck she has radicular pain into all 4 extremities. She has had good relief of her symptoms with prednisone in the past.  She states she did run out she requested a refill her primary care provider did prescribe her short course today she is hoping for a refill of her prednisone.  She is pleased with the resolution of her right ankle pain as well as her stiffness in her hands, Dupuytren's contractures.  She is also in pain management.  She is taking Nucynta.  She relates that she has stopped taking her indomethacin and has been doing well with her current treatment plan.  No new symptoms.  No injuries.  No red flag symptoms.  Assessment & Plan: Visit Diagnoses: No diagnosis found.  Plan: degenerative cervical disc disease: Prednisone 10 mg sent to pharmacy. Will follow up as needed.   Follow-Up Instructions: No follow-ups on file.   Ortho Exam  Patient is alert, oriented, no adenopathy, well-dressed, normal affect, normal respiratory effort. Examination patient has no focal motor weakness in either upper or lower extremity.  She has very limited range of motion of her cervical spine only about 10 degrees of flexion and rotation.  Imaging: No results found. No images are attached to the encounter.  Labs: No results found for: HGBA1C, ESRSEDRATE, CRP, LABURIC, REPTSTATUS, GRAMSTAIN, CULT, LABORGA   Lab Results  Component Value Date   ALBUMIN 4.5 02/08/2021    ALBUMIN 4.4 02/19/2020   ALBUMIN 4.6 09/21/2019    No results found for: MG No results found for: VD25OH  No results found for: PREALBUMIN CBC EXTENDED Latest Ref Rng & Units 02/08/2021  WBC 4.0 - 10.5 K/uL 8.1  RBC 3.87 - 5.11 Mil/uL 3.62(L)  HGB 12.0 - 15.0 g/dL 11.9(L)  HCT 36.0 - 46.0 % 35.5(L)  PLT 150.0 - 400.0 K/uL 220.0  NEUTROABS 1.4 - 7.7 K/uL 5.7  LYMPHSABS 0.7 - 4.0 K/uL 1.6     There is no height or weight on file to calculate BMI.  Orders:  No orders of the defined types were placed in this encounter.  Meds ordered this encounter  Medications   DISCONTD: predniSONE (DELTASONE) 10 MG tablet    Sig: Take 1 tablet (10 mg total) by mouth daily with breakfast.    Dispense:  30 tablet    Refill:  0   predniSONE (DELTASONE) 10 MG tablet    Sig: Take 1 tablet (10 mg total) by mouth daily with breakfast.    Dispense:  30 tablet    Refill:  2     Procedures: No procedures performed  Clinical Data: No additional findings.  ROS:  All other systems negative, except as noted in the HPI. Review of Systems  Objective: Vital Signs: There were no vitals taken for this visit.  Specialty Comments:  No specialty comments available.  PMFS History: Patient Active Problem List   Diagnosis Date Noted   Stenosis of right carotid artery 05/26/2021   Osteoporosis 05/26/2021   Constipation 03/16/2021   Alcoholic peripheral neuropathy (HCC) 02/08/2021   Seborrheic dermatitis, unspecified 02/19/2020   Unstable gait 10/20/2019   Dupuytren's contracture of right hand 09/29/2019   Chronic pain disorder 09/22/2019   Abnormal LFTs (liver function tests) 09/22/2019   Peripheral neuropathy 09/22/2019   History of syncope 09/21/2019   Benign essential hypertension 09/21/2019   Bilateral lower extremity edema 09/21/2019   Alcohol dependency (HCC) 09/18/2018   Closed right trimalleolar fracture 09/18/2018   Depression 09/18/2018   Fall 09/18/2018   Syncope 09/18/2018    Nasal bones, closed fracture 08/07/2018   Hyperlipemia, mixed 04/04/2016   Low grade squamous intraepithelial lesion on cytologic smear of cervix (LGSIL) 04/04/2016   Past Medical History:  Diagnosis Date   Anxiety    Depression    GERD (gastroesophageal reflux disease)    Hypertension    Peripheral neuropathy     Family History  Problem Relation Age of Onset   Breast cancer Mother    Breast cancer Cousin     Past Surgical History:  Procedure Laterality Date   broken ankle     cataract surgery     NOSE SURGERY     Social History   Occupational History   Occupation: retired  Tobacco Use   Smoking status: Former    Types: Cigarettes   Smokeless tobacco: Never  Vaping Use   Vaping Use: Never used  Substance and Sexual Activity   Alcohol use: Yes    Alcohol/week: 3.0 standard drinks    Types: 3 Glasses of wine per week   Drug use: Not on file   Sexual activity: Not on file

## 2021-08-06 ENCOUNTER — Encounter: Payer: Self-pay | Admitting: Family Medicine

## 2021-08-06 DIAGNOSIS — I5030 Unspecified diastolic (congestive) heart failure: Secondary | ICD-10-CM | POA: Insufficient documentation

## 2021-08-18 ENCOUNTER — Other Ambulatory Visit: Payer: Self-pay | Admitting: Family Medicine

## 2021-08-19 ENCOUNTER — Other Ambulatory Visit: Payer: Self-pay | Admitting: Family Medicine

## 2021-08-19 DIAGNOSIS — G6289 Other specified polyneuropathies: Secondary | ICD-10-CM

## 2021-08-31 DIAGNOSIS — G8929 Other chronic pain: Secondary | ICD-10-CM | POA: Diagnosis not present

## 2021-08-31 DIAGNOSIS — M5416 Radiculopathy, lumbar region: Secondary | ICD-10-CM | POA: Diagnosis not present

## 2021-09-07 ENCOUNTER — Other Ambulatory Visit: Payer: Self-pay | Admitting: Family

## 2021-09-27 ENCOUNTER — Other Ambulatory Visit: Payer: Self-pay | Admitting: Family Medicine

## 2021-09-27 DIAGNOSIS — G6289 Other specified polyneuropathies: Secondary | ICD-10-CM

## 2021-10-09 ENCOUNTER — Encounter: Payer: Self-pay | Admitting: Family Medicine

## 2021-10-09 ENCOUNTER — Ambulatory Visit: Payer: Medicare PPO | Admitting: Family Medicine

## 2021-10-09 VITALS — BP 124/72 | HR 104 | Temp 98.4°F | Resp 16 | Ht 65.5 in | Wt 194.4 lb

## 2021-10-09 DIAGNOSIS — R39198 Other difficulties with micturition: Secondary | ICD-10-CM

## 2021-10-09 DIAGNOSIS — R600 Localized edema: Secondary | ICD-10-CM

## 2021-10-09 DIAGNOSIS — G6289 Other specified polyneuropathies: Secondary | ICD-10-CM | POA: Diagnosis not present

## 2021-10-09 DIAGNOSIS — R0602 Shortness of breath: Secondary | ICD-10-CM | POA: Diagnosis not present

## 2021-10-09 DIAGNOSIS — M545 Low back pain, unspecified: Secondary | ICD-10-CM | POA: Diagnosis not present

## 2021-10-09 DIAGNOSIS — R1310 Dysphagia, unspecified: Secondary | ICD-10-CM

## 2021-10-09 DIAGNOSIS — G8929 Other chronic pain: Secondary | ICD-10-CM

## 2021-10-09 DIAGNOSIS — N1832 Chronic kidney disease, stage 3b: Secondary | ICD-10-CM | POA: Diagnosis not present

## 2021-10-09 DIAGNOSIS — R2681 Unsteadiness on feet: Secondary | ICD-10-CM | POA: Diagnosis not present

## 2021-10-09 DIAGNOSIS — I5032 Chronic diastolic (congestive) heart failure: Secondary | ICD-10-CM

## 2021-10-09 DIAGNOSIS — M26623 Arthralgia of bilateral temporomandibular joint: Secondary | ICD-10-CM

## 2021-10-09 DIAGNOSIS — H9203 Otalgia, bilateral: Secondary | ICD-10-CM

## 2021-10-09 LAB — URINALYSIS, ROUTINE W REFLEX MICROSCOPIC
Bilirubin Urine: NEGATIVE
Hgb urine dipstick: NEGATIVE
Ketones, ur: NEGATIVE
Leukocytes,Ua: NEGATIVE
Nitrite: NEGATIVE
Specific Gravity, Urine: 1.015 (ref 1.000–1.030)
Total Protein, Urine: NEGATIVE
Urine Glucose: NEGATIVE
Urobilinogen, UA: 0.2 (ref 0.0–1.0)
pH: 5.5 (ref 5.0–8.0)

## 2021-10-09 NOTE — Progress Notes (Signed)
Chief Complaint  Patient presents with   Dysphagia    HPI: Rebecca Baird is a 75 y.o. female with hx of HTN,constipation, chronic pain,alcohol dependency,and PAD here today with a list of complaints, she is reporting some as new problems.  -For "several weeks" she has had difficulty swallowing saliva. Chocking like sensation , it does "constrict or does not when it is suppose to." Problem happens when talking and "a little bit with eating." She feels like she is "not getting a complete swallow."  Negative for heartburn,N/V,or abdominal pain.  -Constant TMJ pain, L>R.Sometimes pain is exacerbated by eating certain food. Problem has been going on for at least a couple weeks. "Ears are affected", "plugged" like sensation, bilateral. No changes in hearing, earache,or drainage. No recent URI or travel. No associated nasal congestion,rhinorrhea,or sore throat.  Back pain getting worse. She would like to have a lumbar MRI. States that her pain management provider has recommended to have one done but she would like to have it here in [place]. Problem is getting worse. Exacerbated by prolonged standing and walking. Pain is not radiated to lower extremities and she has not noted saddle anesthesia.  Urinary issues: "Dripping", urine leaking , increased urinary frequency, wearing pads. Slower urine stream. Problem has been noted for A couple of months. Nocturia stable for years. Negative for dysuria and hematuria.  Constipation has improved, and allergies have been "good" bowel movements, softer and she does not have to strain. States that now she is afraid for passing gas because possible stool incontinence. She has not had fecal incontinence yet but has been closed to do so. Decreased perianal "feeling "for years, ascribed to peripheral neuropathy. 01/2021 rectal examination with normal tone,no masses,or tenderness. She is taking daily stool softeners. Fecal occult blood  negative on 05/10/21.  Blurry vision. No associated eye pain, conjunctival erythema, or drainage. She is planning on arranging appointment with eye care provided.  -Memory "is not as good."  She is forgetting numbers and dates, she asked that she has not been good with these "anyway" but she thinks it is getting worse for the past year. Negative for unusual headache. Peripheral neuropathy, numbness and tingling of 4 extremities and getting worse. Drinks "a few glasses" of wine daily. She has not seen neuro in the past. Unstable gait, getting worse, no falls since her last  visit.  Lab Results  Component Value Date   VITAMINB12 >1506 (H) 02/08/2021   Lab Results  Component Value Date   TSH 0.95 02/08/2021   Lab Results  Component Value Date   WBC 8.1 02/08/2021   HGB 11.9 (L) 02/08/2021   HCT 35.5 (L) 02/08/2021   MCV 98.0 02/08/2021   PLT 220.0 02/08/2021   Mother with hx of dementia.  -"Exhaustion" with "any activity." She has to rest in between chores, mainly because pain but also fatigue and SOB.  Even with activities like typing, getting dressed, and washing dishes. She would like PT to help with posture and muscle strength. She was doing pool exercises during summer and it seems to help with endurance.   SOB with some activities like putting her shoes. LE edema , she thinks it is getting worse. She cannot put compression stocking on. Negative CP,palpitations,orthopnea ,or PND. Echo in 07/2021: LVEF 60 to 65%, grade 1 diastolic dysfunction. She is on metoprolol succinate 25 mg daily, valsartan 160 mg daily, and furosemide 20 mg daily.  Review of Systems  Constitutional:  Positive for activity change and  fatigue. Negative for appetite change, chills and fever.  HENT:  Positive for trouble swallowing.   Respiratory:  Negative for cough and wheezing.   Gastrointestinal:  Negative for abdominal pain and blood in stool.  Endocrine: Negative for cold intolerance and heat  intolerance.  Genitourinary:  Positive for difficulty urinating. Negative for flank pain, genital sores, vaginal bleeding and vaginal discharge.  Musculoskeletal:  Positive for arthralgias, back pain and gait problem.  Skin:  Negative for pallor and rash.  Allergic/Immunologic: Negative for environmental allergies.  Neurological:  Negative for syncope and facial asymmetry.  Psychiatric/Behavioral:  Negative for confusion and hallucinations. The patient is nervous/anxious.   Rest see pertinent positives and negatives per HPI.  Current Outpatient Medications on File Prior to Visit  Medication Sig Dispense Refill   alendronate (FOSAMAX) 70 MG tablet Take 1 tablet (70 mg total) by mouth every 7 (seven) days. Take with a full glass of water on an empty stomach. 13 tablet 2   Ascorbic Acid (VITAMIN C) 1000 MG tablet Take 1,000 mg by mouth daily.     Cyanocobalamin (VITAMIN B12 PO) Take 5,000 mg by mouth daily.     desonide (DESOWEN) 0.05 % cream APPLY TO FACE TOPICALLY DAILY AS NEEDED 30 g 0   folic acid (FOLVITE) Q000111Q MCG tablet Take 400 mcg by mouth daily.     furosemide (LASIX) 20 MG tablet TAKE 1 TABLET BY MOUTH EVERY DAY 90 tablet 2   gabapentin (NEURONTIN) 300 MG capsule TAKE 1 CAPSULE BY MOUTH THREE TIMES A DAY 90 capsule 3   hydrochlorothiazide (HYDRODIURIL) 25 MG tablet Take 1 tablet (25 mg total) by mouth daily. 90 tablet 1   metoprolol succinate (TOPROL-XL) 25 MG 24 hr tablet TAKE 1 TABLET BY MOUTH EVERY DAY 90 tablet 2   omeprazole (PRILOSEC) 20 MG capsule Take 1 capsule (20 mg total) by mouth daily. 90 capsule 2   predniSONE (DELTASONE) 10 MG tablet TAKE 1 TABLET (10 MG TOTAL) BY MOUTH DAILY WITH BREAKFAST. 30 tablet 2   rosuvastatin (CRESTOR) 10 MG tablet TAKE 1 TABLET BY MOUTH EVERY DAY 90 tablet 2   tapentadol (NUCYNTA) 100 MG 12 hr tablet Take 100 mg by mouth every 12 (twelve) hours.     thiamine (VITAMIN B-1) 100 MG tablet Take 100 mg by mouth daily.     triamcinolone cream  (KENALOG) 0.1 % Apply 1 application topically 2 (two) times daily as needed. Legs 45 g 1   valsartan (DIOVAN) 160 MG tablet TAKE 1 TABLET BY MOUTH EVERY DAY 90 tablet 1   No current facility-administered medications on file prior to visit.     Past Medical History:  Diagnosis Date   Anxiety    Depression    GERD (gastroesophageal reflux disease)    Hypertension    Peripheral neuropathy    Allergies  Allergen Reactions   Epinephrine Other (See Comments) and Palpitations    "Heart Races Uncomfortably"     Social History   Socioeconomic History   Marital status: Married    Spouse name: Not on file   Number of children: 1   Years of education: Not on file   Highest education level: Associate degree: academic program  Occupational History   Occupation: retired  Tobacco Use   Smoking status: Former    Types: Cigarettes   Smokeless tobacco: Never  Vaping Use   Vaping Use: Never used  Substance and Sexual Activity   Alcohol use: Yes    Alcohol/week: 3.0 standard drinks  Types: 3 Glasses of wine per week   Drug use: Not on file   Sexual activity: Not on file  Other Topics Concern   Not on file  Social History Narrative   Right handed   One story home   Drinks occasional caffeine   Social Determinants of Health   Financial Resource Strain: Low Risk    Difficulty of Paying Living Expenses: Not hard at all  Food Insecurity: No Food Insecurity   Worried About Charity fundraiser in the Last Year: Never true   Ran Out of Food in the Last Year: Never true  Transportation Needs: No Transportation Needs   Lack of Transportation (Medical): No   Lack of Transportation (Non-Medical): No  Physical Activity: Inactive   Days of Exercise per Week: 0 days   Minutes of Exercise per Session: 0 min  Stress: No Stress Concern Present   Feeling of Stress : Not at all  Social Connections: Moderately Isolated   Frequency of Communication with Friends and Family: More than three  times a week   Frequency of Social Gatherings with Friends and Family: Never   Attends Religious Services: Never   Marine scientist or Organizations: No   Attends Archivist Meetings: Never   Marital Status: Married   Vitals:   10/09/21 1151  BP: 124/72  Pulse: (!) 104  Resp: 16  Temp: 98.4 F (36.9 C)  SpO2: 95%   Body mass index is 31.86 kg/m.  Physical Exam Vitals and nursing note reviewed.  Constitutional:      General: She is not in acute distress.    Appearance: She is well-developed.  HENT:     Head: Normocephalic and atraumatic.     Right Ear: Hearing, tympanic membrane, ear canal and external ear normal.     Left Ear: Hearing and external ear normal.     Ears:     Comments: Left ear canal excess cerumen, cannot see TM. No tenderness with TMJ palpation, no pain with ROM and no limitation.    Mouth/Throat:     Mouth: Mucous membranes are moist.     Pharynx: Uvula midline. No posterior oropharyngeal erythema.  Eyes:     Conjunctiva/sclera: Conjunctivae normal.  Cardiovascular:     Rate and Rhythm: Regular rhythm. Tachycardia present.     Heart sounds: No murmur heard.    Comments: DP pulse present bilateral. Pulmonary:     Effort: Pulmonary effort is normal. No respiratory distress.     Breath sounds: Normal breath sounds.  Abdominal:     Palpations: Abdomen is soft. There is no hepatomegaly or mass.     Tenderness: There is no abdominal tenderness.  Musculoskeletal:     Lumbar back: No bony tenderness. Decreased range of motion.     Right lower leg: 1+ Pitting Edema present.     Left lower leg: 1+ Pitting Edema present.     Comments: Lower back pain with movement.  Lymphadenopathy:     Cervical: No cervical adenopathy.  Skin:    General: Skin is warm.     Findings: No erythema or rash.  Neurological:     General: No focal deficit present.     Mental Status: She is alert and oriented to person, place, and time.     Cranial Nerves: No  cranial nerve deficit.     Gait: Gait normal.     Comments: Unstable gait assisted with a cane.  Psychiatric:  Mood and Affect: Mood is anxious.     Comments: Well groomed, good eye contact.   ASSESSMENT AND PLAN:  Rebecca Baird was seen today for dysphagia.  Diagnoses and all orders for this visit: Orders Placed This Encounter  Procedures   MR Lumbar Spine Wo Contrast   DG SWALLOW STUDY OP MEDICARE SPEECH PATH   Urinalysis, Routine w reflex microscopic   Ambulatory referral to Neurology   Ambulatory referral to Cardiology   Ambulatory referral to Physical Therapy   Chronic low back pain without sciatica, unspecified back pain laterality Getting worse. She is not having saddle anesthesia but reports some problems with urination and with holding stool. Lumbar MRI order placed. Continue following with pain management. She has seen ortho for upper back pain.  SOB (shortness of breath) Hx is not clear. ?  Deconditioning. We discussed possible etiologies. I think it is important to evaluate for cardiac etiology given her Hx of PAD. Clearly instructed about warning signs.  Bilateral lower extremity edema This is a chronic problem. Lower extremity elevation above heart level a few times during the day and compression stockings recommended.  Other polyneuropathy Chronic. We discussed possible etiologies, alcoholic neuropathy being the main possible cause. Encouraged decreasing alcohol intake. Getting worse. Because also reporting difficulties with memory, neuro referral placed.  Decreased urine stream Hx does not suggest a serious process. Kegel and pelvic floor exercises may help. Further recommendations according to UA.  Constipation have improved,having some soft stools. Recommend decreasing frequency of stool softener.  Dysphagia, unspecified type Hx is not clear. We discussed possible etiologies. Swallowing study will be arranged.  Stage 3b chronic kidney  disease ([place]) We discussed last BMP result, e GFR 40's. Recommend adequate hydration, continue low-salt diet. Continue Valsartan same dose. We will plan on repeating BMP next visit.  Unstable gait I feel of her chronic medical problems as well as medications can be contributing factors. Fall precautions. PT order placed.  Ear discomfort, bilateral ? Eustachian tube dysfunction. Hearing test today normal. Hearing Screening   500Hz  1000Hz  2000Hz  4000Hz   Right ear Pass Pass Pass Pass  Left ear Pass Pass Pass Pass  Auto inflation maneuvers a few times per day may help.  Bilateral temporomandibular joint pain ? OA. Recommend mentioning problem to her dentist. Hx and examination today do not suggest a serious process.  Chronic heart failure with preserved ejection fraction (HCC) BNP in 06/2021 in normal range. Continue low-salt diet, metoprolol succinate 25 mg daily, Valsartan 160 mg daily,and furosemide 20 mg daily.  I spent a total of 72 minutes in both face to face and non face to face activities for this visit on the date of this encounter. During this time history was obtained and documented, examination was performed, prior labs/imaging reviewed, and assessment/plan discussed.  Return if symptoms worsen or fail to improve, for Keep next appt.   Kazuo Durnil G. [place], MD  [facility]. [place] office.

## 2021-10-09 NOTE — Patient Instructions (Addendum)
A few things to remember from today's visit:  SOB (shortness of breath) - Plan: Ambulatory referral to Cardiology  Bilateral lower extremity edema  Chronic low back pain without sciatica, unspecified back pain laterality - Plan: MR Lumbar Spine Wo Contrast  Other polyneuropathy - Plan: Ambulatory referral to Neurology  Decreased urine stream - Plan: Urinalysis, Routine w reflex microscopic  Dysphagia, unspecified type - Plan: DG SWALLOW STUDY OP MEDICARE SPEECH PATH  If you need refills please call your pharmacy. Do not use My Chart to request refills or for acute issues that need immediate attention.  Please arrange appt with eye care provider. No changes today.  This exercise helps with mild urine leakage associated with cough, laughing, or sneezing. It may help with other types of urine incontinence and even with mild fecal incontinence.   Tighten and relax the pelvic muscles intermittently during the day. Once you are familiar with exercise try to hold pelvic muscles contraction for about 8-10 seconds.in the beginning you may not be able to hold contraction for more than a second or 2 but eventually you will be able to hold contraction harder and for longer time. Perform  8-12 exercises 3 times per day and daily for 15-20 weeks. You will need to continue exercises indefinitely to have a lasting effect.  Decrease frequency of stool softener.  Please be sure medication list is accurate. If a new problem present, please set up appointment sooner than planned today.  Kegel Exercises Kegel exercises can help strengthen your pelvic floor muscles. The pelvic floor is a group of muscles that support your rectum, small intestine, and bladder. In females, pelvic floor muscles also help support the uterus. These muscles help you control the flow of urine and stool (feces). Kegel exercises are painless and simple. They do not require any equipment. Your provider may suggest Kegel exercises  to: Improve bladder and bowel control. Improve sexual response. Improve weak pelvic floor muscles after surgery to remove the uterus (hysterectomy) or after pregnancy, in females. Improve weak pelvic floor muscles after prostate gland removal or surgery, in males. Kegel exercises involve squeezing your pelvic floor muscles. These are the same muscles you squeeze when you try to stop the flow of urine or keep from passing gas. The exercises can be done while sitting, standing, or lying down, but it is best to vary your position. Ask your health care provider which exercises are safe for you. Do exercises exactly as told by your health care provider and adjust them as directed. Do not begin these exercises until told by your health care provider. Exercises How to do Kegel exercises: Squeeze your pelvic floor muscles tight. You should feel a tight lift in your rectal area. If you are a female, you should also feel a tightness in your vaginal area. Keep your stomach, buttocks, and legs relaxed. Hold the muscles tight for up to 10 seconds. Breathe normally. Relax your muscles for up to 10 seconds. Repeat as told by your health care provider. Repeat this exercise daily as told by your health care provider. Continue to do this exercise for at least 4-6 weeks, or for as long as told by your health care provider. You may be referred to a physical therapist who can help you learn more about how to do Kegel exercises. Depending on your condition, your health care provider may recommend: Varying how long you squeeze your muscles. Doing several sets of exercises every day. Doing exercises for several weeks. Making Kegel exercises  a part of your regular exercise routine. This information is not intended to replace advice given to you by your health care provider. Make sure you discuss any questions you have with your health care provider. Document Revised: 03/09/2021 Document Reviewed: 03/09/2021 Elsevier  Patient Education  2022 [employer].

## 2021-10-11 ENCOUNTER — Other Ambulatory Visit: Payer: Self-pay | Admitting: Family Medicine

## 2021-10-11 DIAGNOSIS — L219 Seborrheic dermatitis, unspecified: Secondary | ICD-10-CM

## 2021-10-16 ENCOUNTER — Telehealth: Payer: Self-pay

## 2021-10-16 NOTE — Telephone Encounter (Signed)
I spoke with patient. She is aware that she will receive a phone call from [facility] to schedule her MRI. She will let me know if she hasn't heard anything by the end of the week. Pt will also need a copy of the results sent to her pain doctor.

## 2021-10-16 NOTE — Telephone Encounter (Signed)
Message sent to Deb to see about scheduling.

## 2021-10-16 NOTE — Telephone Encounter (Signed)
Patient called stating she received a call from insurance informing her that MRI lumbar scan w/o contrast was approved until 11/08/21 and patient wants a call back to discuss getting started.

## 2021-10-17 ENCOUNTER — Other Ambulatory Visit: Payer: Self-pay

## 2021-10-17 ENCOUNTER — Ambulatory Visit: Payer: Medicare PPO | Attending: Family Medicine | Admitting: Rehabilitative and Restorative Service Providers"

## 2021-10-17 ENCOUNTER — Encounter: Payer: Self-pay | Admitting: Rehabilitative and Restorative Service Providers"

## 2021-10-17 DIAGNOSIS — G8929 Other chronic pain: Secondary | ICD-10-CM | POA: Diagnosis not present

## 2021-10-17 DIAGNOSIS — M6281 Muscle weakness (generalized): Secondary | ICD-10-CM | POA: Diagnosis not present

## 2021-10-17 DIAGNOSIS — R262 Difficulty in walking, not elsewhere classified: Secondary | ICD-10-CM

## 2021-10-17 DIAGNOSIS — M545 Low back pain, unspecified: Secondary | ICD-10-CM | POA: Diagnosis not present

## 2021-10-17 DIAGNOSIS — G6289 Other specified polyneuropathies: Secondary | ICD-10-CM | POA: Insufficient documentation

## 2021-10-17 DIAGNOSIS — M62838 Other muscle spasm: Secondary | ICD-10-CM

## 2021-10-17 DIAGNOSIS — R2681 Unsteadiness on feet: Secondary | ICD-10-CM | POA: Insufficient documentation

## 2021-10-17 NOTE — Patient Instructions (Signed)
Access Code: [email or web address] URL: [email or web address] Date: 10/17/2021 Prepared by: Reather Laurence  Exercises Seated Long Arc Quad - 1-2 x daily - 7 x weekly - 2 sets - 10 reps Seated March - 1-2 x daily - 7 x weekly - 2 sets - 10 reps Seated Hamstring Stretch - 1 x daily - 7 x weekly - 1 sets - 2 reps - 20 sec hold

## 2021-10-17 NOTE — Therapy (Signed)
[facility] [address] [place], [place], [address] Phone: [phone]   Fax:  [phone]  Physical Therapy Evaluation  Patient Details  Name: Rebecca Baird MRN: [id number] Date of Birth: Mar 30, 1946 Referring Provider (PT): Dr Betty Swaziland   Encounter Date: 10/17/2021   PT End of Session - 10/17/21 1557     Visit Number 1    Date for PT Re-Evaluation 12/08/21    Authorization Type [employer]    Progress Note Due on Visit 10    PT Start Time 1400    PT Stop Time 1440    PT Time Calculation (min) 40 min    Activity Tolerance Patient limited by pain    Behavior During Therapy [facility] for tasks assessed/performed             Past Medical History:  Diagnosis Date   Anxiety    Depression    GERD (gastroesophageal reflux disease)    Hypertension    Peripheral neuropathy     Past Surgical History:  Procedure Laterality Date   broken ankle     cataract surgery     NOSE SURGERY      There were no vitals filed for this visit.    Subjective Assessment - 10/17/21 1405     Subjective Pt reports that her back muscles are tired and she has been feeling exhausted, more so today than normal. Pt reports that she went to the dentist for a cleaning yesterday and they spoke to her about her TMJ pain. Pt reports that she has neuropathy is affecting both UE and LE. Pt reports a history of CVA, but none recently and she had therapy after. Pt reports syncopal episodes in past, approx 5 years ago. Pt had some injuries as a result of those falls, including breaking her R ankle.  Pt reports that her back pain started out as intermittent back pain and it has progressively gotten worse.  She reports that she can no longer go on walks for exercise as she did prior. She states a week or 2 ago that pain started moving higher up her back. Pt has a pain management MD in [place] who recommended a MRI and Dr Swaziland ordered MRI and it is  scheduled before Christmas.    Pertinent History HTN, constipation, chronic pain, and PAD.    Limitations Standing;Walking;Lifting    How long can you stand comfortably? 2 min    How long can you walk comfortably? 2 minutes inside home.  Pt used cane inside home    Diagnostic tests lumbar MRI ordered    Patient Stated Goals Pt would like to get back to taking walks outside, preferably without assistive device.    Currently in Pain? Yes    Pain Score 5     Pain Location Back    Pain Orientation Mid;Lower    Pain Descriptors / Indicators Constant;Sharp    Pain Type Chronic pain    Aggravating Factors  standing, walking    Pain Relieving Factors return to sitting    Effect of Pain on Daily Activities difficulty performing ADLs and household chores                [facility] PT Assessment - 10/17/21 0001       Assessment   Medical Diagnosis G62.89 (ICD-10-CM) - Other polyneuropathy  R26.81 (ICD-10-CM) - Unstable gait    Referring Provider (PT) Dr Betty Swaziland    Hand Dominance Right  Prior Therapy yes, in years past      Precautions   Precautions Fall      Balance Screen   Has the patient fallen in the past 6 months No    Has the patient had a decrease in activity level because of a fear of falling?  Yes    Is the patient reluctant to leave their home because of a fear of falling?  Yes      Home Environment   Living Environment Private residence    Living Arrangements Spouse/significant other    Type of Home House    Home Access Level entry    Home Layout One level    Home Equipment [place] - single point;Walker - 4 wheels;Shower seat;Grab bars - tub/shower      Prior Function   Level of Independence Independent with household mobility without device;Independent with community mobility with device    Vocation Retired    Leisure walking, watching tv with husband      Cognition   Overall Cognitive Status Impaired/Different from baseline    Memory Impaired   pt reports that  her memory is impaired from what was     Observation/Other Assessments   Focus on Therapeutic Outcomes (FOTO)  39%   Projected by visit 12: 52%     Posture/Postural Control   Posture/Postural Control Postural limitations    Postural Limitations Rounded Shoulders;Forward head      ROM / Strength   AROM / PROM / Strength Strength      Strength   Overall Strength Comments BLE strength of grossly 4/5 throughout      Flexibility   Soft Tissue Assessment /Muscle Length yes    Hamstrings B tightness    Piriformis B tightness      Transfers   Five time sit to stand comments  20.6 sec with UE use      Standardized Balance Assessment   Standardized Balance Assessment Timed Up and Go Test      Timed Up and Go Test   TUG Normal TUG    Normal TUG (seconds) 12   without AD unsteadiness noted with sit to stand portion of assessment                       Objective measurements completed on examination: See above findings.                PT Education - 10/17/21 1446     Education Details Pt provided with HEP    Person(s) Educated Patient    Methods Explanation;Demonstration;Handout    Comprehension Verbalized understanding;Returned demonstration              PT Short Term Goals - 10/17/21 1609       PT SHORT TERM GOAL #1   Title Pt will be independent with initial HEP.    Time 2    Period Weeks    Status New               PT Long Term Goals - 10/17/21 1609       PT LONG TERM GOAL #1   Title Pt will be independent with advanced HEP.    Time 8    Period Weeks    Status New      PT LONG TERM GOAL #2   Title Pt will increase FOTO to at least 52% to allow her decreased pain with functional activities.    Time 8  Period Weeks    Status New      PT LONG TERM GOAL #3   Title Pt will report no increased pain with ambulation or with TMJ area with functional activities.    Time 8    Period Weeks    Status New      PT LONG TERM GOAL  #4   Title Pt will increase BLE strength to at least 4+/5 to allow her to perform functional activities with improved ease and less assistance of husband.    Time 8    Period Weeks    Status New      PT LONG TERM GOAL #5   Title Pt will increase balance to allow her to ambulate outside with LRAD to allow her to return to walking program.    Time 8    Period Weeks    Status New                    Plan - 10/17/21 1558     Clinical Impression Statement Pt is a 75 y.o. female referred to outpatient PT by Dr Betty Swaziland secondary to polyneuropathy and unstable gait. Pt's PLOF was able to ambulate with only using cane outside of home and able to go on walks for pleasure without assistance. Pt with orders for a lumbar MRI that she will receive prior to the end of the year. Pt states that she has been having some difficulty with memory and swallowing.  Educated pt about Speech Therapy and to reach out to Dr Swaziland if she woud like to have a referral to Speech Therapy services, as she could receive this at [facility] location near her home. Pt states that she has been having TMJ pain and it is more painful today secondary to going to the dentist yesterday for a cleaning. Pt declined dry needling to TMJ area, but stated that she would consider. Pt has recent history of incontinence and states wearing a pad. Pt presents with increased time required with sit to/from stand with unsteadiness noted. She has increased weakness, increased pain, difficulty walking, hamstring/piriformis tightness, and decreased balance noted. Pt would benefit from skilled PT services to progress her towards improved safety and stability to allow her to return to walking program and her hopes to travel again.    Personal Factors and Comorbidities Comorbidity 3+    Comorbidities HTN,constipation, chronic pain, PAD, Incontinance of bladder with nocturia.    Examination-Activity Limitations Locomotion Level;Squat;Stairs;Stand     Examination-Participation Restrictions Community Activity    Stability/Clinical Decision Making Evolving/Moderate complexity    Clinical Decision Making Moderate    Rehab Potential Good    PT Frequency 2x / week    PT Duration 8 weeks    PT Treatment/Interventions ADLs/Self Care Home Management;Aquatic Therapy;Cryotherapy;Electrical Stimulation;Iontophoresis 4mg /ml Dexamethasone;Moist Heat;Traction;Ultrasound;Gait training;Stair training;Functional mobility training;Therapeutic activities;Therapeutic exercise;Balance training;Neuromuscular re-education;Patient/family education;Manual techniques;Passive range of motion;Dry needling;Taping;Vasopneumatic Device;Spinal Manipulations;Joint Manipulations    PT Next Visit Plan assess and progress HEP, strengthening, core stability    PT Home Exercise Plan Access Code: [email or web address]    Consulted and Agree with Plan of Care Patient             Patient will benefit from skilled therapeutic intervention in order to improve the following deficits and impairments:  Difficulty walking, Increased muscle spasms, Pain, Decreased balance, Impaired flexibility, Decreased strength, Postural dysfunction  Visit Diagnosis: Chronic low back pain, unspecified back pain laterality, unspecified whether sciatica present - Plan: PT plan of care  cert/re-cert  Muscle weakness (generalized) - Plan: PT plan of care cert/re-cert  Difficulty in walking, not elsewhere classified - Plan: PT plan of care cert/re-cert  Other muscle spasm - Plan: PT plan of care cert/re-cert     Problem List Patient Active Problem List   Diagnosis Date Noted   (HFpEF) heart failure with preserved ejection fraction (HCC) 08/06/2021   Stenosis of right carotid artery 05/26/2021   Osteoporosis 05/26/2021   Constipation 03/16/2021   Alcoholic peripheral neuropathy (HCC) 02/08/2021   Seborrheic dermatitis, unspecified 02/19/2020   Unstable gait 10/20/2019   Dupuytren's contracture of  right hand 09/29/2019   Chronic pain disorder 09/22/2019   Abnormal LFTs (liver function tests) 09/22/2019   Peripheral neuropathy 09/22/2019   History of syncope 09/21/2019   Benign essential hypertension 09/21/2019   Bilateral lower extremity edema 09/21/2019   Alcohol dependency (HCC) 09/18/2018   Closed right trimalleolar fracture 09/18/2018   Depression 09/18/2018   Fall 09/18/2018   Syncope 09/18/2018   Nasal bones, closed fracture 08/07/2018   Hyperlipemia, mixed 04/04/2016   Low grade squamous intraepithelial lesion on cytologic smear of cervix (LGSIL) 04/04/2016    Reather Laurence, PT, DPT 10/17/2021, 4:14 PM  [facility] @ [facility] [address] [place], [place], [address] Phone: [phone]   Fax:  [phone]  Name: Rebecca Baird MRN: [id number] Date of Birth: [birth year]

## 2021-10-24 ENCOUNTER — Other Ambulatory Visit: Payer: Self-pay

## 2021-10-24 ENCOUNTER — Ambulatory Visit: Payer: Medicare PPO | Admitting: Physical Therapy

## 2021-10-24 ENCOUNTER — Encounter: Payer: Self-pay | Admitting: Physical Therapy

## 2021-10-24 DIAGNOSIS — M62838 Other muscle spasm: Secondary | ICD-10-CM | POA: Diagnosis not present

## 2021-10-24 DIAGNOSIS — G8929 Other chronic pain: Secondary | ICD-10-CM | POA: Diagnosis not present

## 2021-10-24 DIAGNOSIS — M6281 Muscle weakness (generalized): Secondary | ICD-10-CM

## 2021-10-24 DIAGNOSIS — G6289 Other specified polyneuropathies: Secondary | ICD-10-CM | POA: Diagnosis not present

## 2021-10-24 DIAGNOSIS — M545 Low back pain, unspecified: Secondary | ICD-10-CM | POA: Diagnosis not present

## 2021-10-24 DIAGNOSIS — R262 Difficulty in walking, not elsewhere classified: Secondary | ICD-10-CM

## 2021-10-24 DIAGNOSIS — R2681 Unsteadiness on feet: Secondary | ICD-10-CM | POA: Diagnosis not present

## 2021-10-24 NOTE — Therapy (Signed)
[facility] [address] [place], [place], [address] Phone: [phone]   Fax:  [phone]  Physical Therapy Treatment  Patient Details  Name: Rebecca Baird MRN: [id number] Date of Birth: 1946-08-15 Referring Provider (PT): Dr Betty Swaziland   Encounter Date: 10/24/2021   PT End of Session - 10/24/21 1426     Visit Number 2    Date for PT Re-Evaluation 12/08/21    Authorization Type [employer]    PT Start Time 1345    PT Stop Time 1427    PT Time Calculation (min) 42 min    Activity Tolerance Patient tolerated treatment well    Behavior During Therapy [facility] for tasks assessed/performed             Past Medical History:  Diagnosis Date   Anxiety    Depression    GERD (gastroesophageal reflux disease)    Hypertension    Peripheral neuropathy     Past Surgical History:  Procedure Laterality Date   broken ankle     cataract surgery     NOSE SURGERY      There were no vitals filed for this visit.   Subjective Assessment - 10/24/21 1343     Subjective Shoulder were really tight this morning taking over an hour to get dressed. Pt reports that her back has been hurting in new places. Pain started in low back not it has spread up the spine.    Currently in Pain? Yes    Pain Score 4     Pain Location Hand    Pain Orientation Left;Right    Pain Descriptors / Indicators --   neuropathy                              [facility] Adult PT Treatment/Exercise - 10/24/21 0001       High Level Balance   High Level Balance Comments standing reaching outside BOS 2x10 each      Exercises   Exercises Lumbar      Lumbar Exercises: Aerobic   Nustep L4 x 6 min      Lumbar Exercises: Standing   Shoulder Extension Strengthening;Both;20 reps;Theraband    Theraband Level (Shoulder Extension) Level 1 (Yellow)      Lumbar Exercises: Seated   Long Arc Quad on Chair Strengthening;Both;2 sets;10 reps     LAQ on Chair Weights (lbs) 2    Sit to Stand 5 reps   x2, slightly elevated mat, multiple attemps due to form and sequencing   Other Seated Lumbar Exercises Rows red 2x10, Seated marches 2lb 2x10    Other Seated Lumbar Exercises red HS curls 2x10                       PT Short Term Goals - 10/17/21 1609       PT SHORT TERM GOAL #1   Title Pt will be independent with initial HEP.    Time 2    Period Weeks    Status New               PT Long Term Goals - 10/17/21 1609       PT LONG TERM GOAL #1   Title Pt will be independent with advanced HEP.    Time 8    Period Weeks    Status New      PT LONG TERM GOAL #  2   Title Pt will increase FOTO to at least 52% to allow her decreased pain with functional activities.    Time 8    Period Weeks    Status New      PT LONG TERM GOAL #3   Title Pt will report no increased pain with ambulation or with TMJ area with functional activities.    Time 8    Period Weeks    Status New      PT LONG TERM GOAL #4   Title Pt will increase BLE strength to at least 4+/5 to allow her to perform functional activities with improved ease and less assistance of husband.    Time 8    Period Weeks    Status New      PT LONG TERM GOAL #5   Title Pt will increase balance to allow her to ambulate outside with LRAD to allow her to return to walking program.    Time 8    Period Weeks    Status New                   Plan - 10/24/21 1427     Clinical Impression Statement Pt tolerated an initial progression to TE well. increase time to complete sit to stands with UE focusing on anterior weight shift, preventing LE from pushing against mat. Pt unstable at times standing without AD requiring assist at times due to LOB. Cues to complete full ROM with seated LE strengthening interventions. Some postural swaying with standing shoulder Ext.    Personal Factors and Comorbidities Comorbidity 3+    Comorbidities HTN,constipation,  chronic pain, PAD, Incontinance of bladder with nocturia.    Examination-Activity Limitations Locomotion Level;Squat;Stairs;Stand    Examination-Participation Restrictions Community Activity    Stability/Clinical Decision Making Evolving/Moderate complexity    Rehab Potential Good    PT Frequency 2x / week    PT Duration 8 weeks    PT Treatment/Interventions ADLs/Self Care Home Management;Aquatic Therapy;Cryotherapy;Electrical Stimulation;Iontophoresis 4mg /ml Dexamethasone;Moist Heat;Traction;Ultrasound;Gait training;Stair training;Functional mobility training;Therapeutic activities;Therapeutic exercise;Balance training;Neuromuscular re-education;Patient/family education;Manual techniques;Passive range of motion;Dry needling;Taping;Vasopneumatic Device;Spinal Manipulations;Joint Manipulations    PT Next Visit Plan assess and progress HEP, strengthening, core stability             Patient will benefit from skilled therapeutic intervention in order to improve the following deficits and impairments:  Difficulty walking, Increased muscle spasms, Pain, Decreased balance, Impaired flexibility, Decreased strength, Postural dysfunction  Visit Diagnosis: Chronic low back pain, unspecified back pain laterality, unspecified whether sciatica present  Other muscle spasm  Difficulty in walking, not elsewhere classified  Muscle weakness (generalized)     Problem List Patient Active Problem List   Diagnosis Date Noted   (HFpEF) heart failure with preserved ejection fraction (HCC) 08/06/2021   Stenosis of right carotid artery 05/26/2021   Osteoporosis 05/26/2021   Constipation 03/16/2021   Alcoholic peripheral neuropathy (HCC) 02/08/2021   Seborrheic dermatitis, unspecified 02/19/2020   Unstable gait 10/20/2019   Dupuytren's contracture of right hand 09/29/2019   Chronic pain disorder 09/22/2019   Abnormal LFTs (liver function tests) 09/22/2019   Peripheral neuropathy 09/22/2019   History  of syncope 09/21/2019   Benign essential hypertension 09/21/2019   Bilateral lower extremity edema 09/21/2019   Alcohol dependency (HCC) 09/18/2018   Closed right trimalleolar fracture 09/18/2018   Depression 09/18/2018   Fall 09/18/2018   Syncope 09/18/2018   Nasal bones, closed fracture 08/07/2018   Hyperlipemia, mixed 04/04/2016   Low grade squamous intraepithelial  lesion on cytologic smear of cervix (LGSIL) 04/04/2016    Grayce Sessions, PTA 10/24/2021, 2:30 PM  [facility] [address] [place], [place], [address] Phone: [phone]   Fax:  [phone]  Name: Erilyn Pearman MRN: [id number] Date of Birth: 07/01/1946

## 2021-10-26 ENCOUNTER — Other Ambulatory Visit: Payer: Self-pay

## 2021-10-26 ENCOUNTER — Ambulatory Visit: Payer: Medicare PPO | Admitting: Physical Therapy

## 2021-10-26 DIAGNOSIS — R2681 Unsteadiness on feet: Secondary | ICD-10-CM | POA: Diagnosis not present

## 2021-10-26 DIAGNOSIS — G8929 Other chronic pain: Secondary | ICD-10-CM

## 2021-10-26 DIAGNOSIS — M6281 Muscle weakness (generalized): Secondary | ICD-10-CM

## 2021-10-26 DIAGNOSIS — R262 Difficulty in walking, not elsewhere classified: Secondary | ICD-10-CM | POA: Diagnosis not present

## 2021-10-26 DIAGNOSIS — M545 Low back pain, unspecified: Secondary | ICD-10-CM

## 2021-10-26 DIAGNOSIS — G6289 Other specified polyneuropathies: Secondary | ICD-10-CM | POA: Diagnosis not present

## 2021-10-26 DIAGNOSIS — M62838 Other muscle spasm: Secondary | ICD-10-CM | POA: Diagnosis not present

## 2021-10-26 NOTE — Therapy (Signed)
[facility] [address] [place], [place], [address] Phone: [phone]   Fax:  [phone]  Physical Therapy Treatment  Patient Details  Name: Rebecca Baird MRN: [id number] Date of Birth: 1946/06/24 Referring Provider (PT): Dr Betty Swaziland   Encounter Date: 10/26/2021   PT End of Session - 10/26/21 1147     Visit Number 3    Date for PT Re-Evaluation 12/08/21    Authorization Type [employer]    PT Start Time 1105    PT Stop Time 1146    PT Time Calculation (min) 41 min             Past Medical History:  Diagnosis Date   Anxiety    Depression    GERD (gastroesophageal reflux disease)    Hypertension    Peripheral neuropathy     Past Surgical History:  Procedure Laterality Date   broken ankle     cataract surgery     NOSE SURGERY      There were no vitals filed for this visit.   Subjective Assessment - 10/26/21 1109     Subjective no pain,but feel my left knee which is unsual. last work out was good. did not sleep well last night    Currently in Pain? No/denies                               [facility] Adult PT Treatment/Exercise - 10/26/21 0001       Exercises   Exercises Knee/Hip      Lumbar Exercises: Aerobic   Nustep L5 x 6 min      Lumbar Exercises: Standing   Row Strengthening;20 reps;Theraband    Theraband Level (Row) Level 2 (Red)    Shoulder Extension Strengthening;Both;20 reps;Theraband    Theraband Level (Shoulder Extension) Level 2 (Red)      Lumbar Exercises: Seated   Sit to Stand 10 reps   on airex, multi tries and CG/min A   Other Seated Lumbar Exercises wt ball core ex- multi directional    Other Seated Lumbar Exercises red HS curls 2x10      Knee/Hip Exercises: Seated   Long Arc Quad Strengthening;Both;2 sets;10 reps;Weights    Long Arc Quad Weight 3 lbs.    Ball Squeeze 15x    Clamshell with TheraBand Green    Marching Strengthening;Both;2  sets;10 reps   green tband                      PT Short Term Goals - 10/17/21 1609       PT SHORT TERM GOAL #1   Title Pt will be independent with initial HEP.    Time 2    Period Weeks    Status New               PT Long Term Goals - 10/17/21 1609       PT LONG TERM GOAL #1   Title Pt will be independent with advanced HEP.    Time 8    Period Weeks    Status New      PT LONG TERM GOAL #2   Title Pt will increase FOTO to at least 52% to allow her decreased pain with functional activities.    Time 8    Period Weeks    Status New      PT LONG TERM GOAL #3  Title Pt will report no increased pain with ambulation or with TMJ area with functional activities.    Time 8    Period Weeks    Status New      PT LONG TERM GOAL #4   Title Pt will increase BLE strength to at least 4+/5 to allow her to perform functional activities with improved ease and less assistance of husband.    Time 8    Period Weeks    Status New      PT LONG TERM GOAL #5   Title Pt will increase balance to allow her to ambulate outside with LRAD to allow her to return to walking program.    Time 8    Period Weeks    Status New                   Plan - 10/26/21 1147     Clinical Impression Statement progreesed ex with cuing needed for form and speed. shld fatigue with wt ball core ex. educ on relaxation tech as she holds herself very tense. multi attempts with STS but able ot stab one up on mat.    PT Treatment/Interventions ADLs/Self Care Home Management;Aquatic Therapy;Cryotherapy;Electrical Stimulation;Iontophoresis 4mg /ml Dexamethasone;Moist Heat;Traction;Ultrasound;Gait training;Stair training;Functional mobility training;Therapeutic activities;Therapeutic exercise;Balance training;Neuromuscular re-education;Patient/family education;Manual techniques;Passive range of motion;Dry needling;Taping;Vasopneumatic Device;Spinal Manipulations;Joint Manipulations    PT Next Visit  Plan assess and progress HEP, strengthening, core stability             Patient will benefit from skilled therapeutic intervention in order to improve the following deficits and impairments:  Difficulty walking, Increased muscle spasms, Pain, Decreased balance, Impaired flexibility, Decreased strength, Postural dysfunction  Visit Diagnosis: Chronic low back pain, unspecified back pain laterality, unspecified whether sciatica present  Other muscle spasm  Muscle weakness (generalized)  Difficulty in walking, not elsewhere classified     Problem List Patient Active Problem List   Diagnosis Date Noted   (HFpEF) heart failure with preserved ejection fraction (HCC) 08/06/2021   Stenosis of right carotid artery 05/26/2021   Osteoporosis 05/26/2021   Constipation 03/16/2021   Alcoholic peripheral neuropathy (HCC) 02/08/2021   Seborrheic dermatitis, unspecified 02/19/2020   Unstable gait 10/20/2019   Dupuytren's contracture of right hand 09/29/2019   Chronic pain disorder 09/22/2019   Abnormal LFTs (liver function tests) 09/22/2019   Peripheral neuropathy 09/22/2019   History of syncope 09/21/2019   Benign essential hypertension 09/21/2019   Bilateral lower extremity edema 09/21/2019   Alcohol dependency (HCC) 09/18/2018   Closed right trimalleolar fracture 09/18/2018   Depression 09/18/2018   Fall 09/18/2018   Syncope 09/18/2018   Nasal bones, closed fracture 08/07/2018   Hyperlipemia, mixed 04/04/2016   Low grade squamous intraepithelial lesion on cytologic smear of cervix (LGSIL) 04/04/2016    Telissa Palmisano,ANGIE, PTA 10/26/2021, 11:52 AM  [facility] [address] [place], [place], [place] Phone: [phone]   Fax:  [phone]  Name: Rebecca Baird MRN: Georgena Baird Date of Birth: Oct 22, 1946

## 2021-10-27 DIAGNOSIS — M5416 Radiculopathy, lumbar region: Secondary | ICD-10-CM | POA: Diagnosis not present

## 2021-10-27 DIAGNOSIS — G8929 Other chronic pain: Secondary | ICD-10-CM | POA: Diagnosis not present

## 2021-10-30 ENCOUNTER — Other Ambulatory Visit: Payer: Self-pay

## 2021-10-30 ENCOUNTER — Ambulatory Visit: Payer: Medicare PPO | Admitting: Physical Therapy

## 2021-10-30 ENCOUNTER — Encounter: Payer: Self-pay | Admitting: Physical Therapy

## 2021-10-30 DIAGNOSIS — R262 Difficulty in walking, not elsewhere classified: Secondary | ICD-10-CM | POA: Diagnosis not present

## 2021-10-30 DIAGNOSIS — G8929 Other chronic pain: Secondary | ICD-10-CM | POA: Diagnosis not present

## 2021-10-30 DIAGNOSIS — R2681 Unsteadiness on feet: Secondary | ICD-10-CM | POA: Diagnosis not present

## 2021-10-30 DIAGNOSIS — M545 Low back pain, unspecified: Secondary | ICD-10-CM | POA: Diagnosis not present

## 2021-10-30 DIAGNOSIS — M6281 Muscle weakness (generalized): Secondary | ICD-10-CM | POA: Diagnosis not present

## 2021-10-30 DIAGNOSIS — M62838 Other muscle spasm: Secondary | ICD-10-CM

## 2021-10-30 DIAGNOSIS — G6289 Other specified polyneuropathies: Secondary | ICD-10-CM | POA: Diagnosis not present

## 2021-10-30 NOTE — Therapy (Signed)
[place], [place], [address] Phone: [phone]   Fax:  [phone]  Physical Therapy Treatment  Patient Details  Name: Rebecca Baird MRN: [id number] Date of Birth: 26-Aug-1946 Referring Provider (PT): Dr Betty Martinique   Encounter Date: 10/30/2021   PT End of Session - 10/30/21 1228     Visit Number 4    Date for PT Re-Evaluation 12/08/21    Authorization Type [employer]    PT Start Time 1146    PT Stop Time 1228    PT Time Calculation (min) 42 min    Activity Tolerance Patient tolerated treatment well    Behavior During Therapy [facility] for tasks assessed/performed             Past Medical History:  Diagnosis Date   Anxiety    Depression    GERD (gastroesophageal reflux disease)    Hypertension    Peripheral neuropathy     Past Surgical History:  Procedure Laterality Date   broken ankle     cataract surgery     NOSE SURGERY      There were no vitals filed for this visit.   Subjective Assessment - 10/30/21 1149     Subjective "I am not bad today, I wouldn't mind a nap"    Currently in Pain? Yes   On med's   Pain Score 3    "My baseline"   Pain Location Back                               [facility] Adult PT Treatment/Exercise - 10/30/21 0001       Ambulation/Gait   Stairs Yes    Stairs Assistance 5: Supervision    Stair Management Technique Two rails;Alternating pattern    Number of Stairs 9    Height of Stairs 4      Lumbar Exercises: Aerobic   Nustep L5 x 6 min      Lumbar Exercises: Machines for Strengthening   Cybex Knee Extension 5lb 2x10    Cybex Knee Flexion 20lb 2x10      Lumbar Exercises: Standing   Row Strengthening;20 reps;Theraband    Theraband Level (Row) Level 2 (Red)    Shoulder Extension Strengthening;Both;20 reps;Theraband    Theraband Level (Shoulder Extension) Level 2 (Red)      Lumbar Exercises: Seated   Sit to Stand 10  reps   x2, mat slightly elevated   Other Seated Lumbar Exercises wt ball core ex- multi directional, OGP yelow ball 2x10                       PT Short Term Goals - 10/30/21 1228       PT SHORT TERM GOAL #1   Title Pt will be independent with initial HEP.    Status Partially Met               PT Long Term Goals - 10/30/21 1220       PT LONG TERM GOAL #2   Title Pt will increase FOTO to at least 52% to allow her decreased pain with functional activities.    Status On-going      PT LONG TERM GOAL #3   Title Pt will report no increased pain with ambulation or with TMJ area with functional activities.    Status Partially Met  Plan - 10/30/21 1229     Clinical Impression Statement Pt able to complete exercises. Sit to stands required the mat to be slightly elevated for her to complete, cues also needed for sequencing. Some postural sway noted with standing rows and shoulder extensions. Cues to complete full ROM with leg curls and extensions.    Personal Factors and Comorbidities Comorbidity 3+    Comorbidities HTN,constipation, chronic pain, PAD, Incontinance of bladder with nocturia.    Examination-Activity Limitations Locomotion Level;Squat;Stairs;Stand    Examination-Participation Restrictions Community Activity    Stability/Clinical Decision Making Evolving/Moderate complexity    Rehab Potential Good    PT Frequency 2x / week    PT Duration 2 weeks    PT Treatment/Interventions ADLs/Self Care Home Management;Aquatic Therapy;Cryotherapy;Electrical Stimulation;Iontophoresis 42m/ml Dexamethasone;Moist Heat;Traction;Ultrasound;Gait training;Stair training;Functional mobility training;Therapeutic activities;Therapeutic exercise;Balance training;Neuromuscular re-education;Patient/family education;Manual techniques;Passive range of motion;Dry needling;Taping;Vasopneumatic Device;Spinal Manipulations;Joint Manipulations    PT Next Visit Plan  assess and progress HEP, strengthening, core stability             Patient will benefit from skilled therapeutic intervention in order to improve the following deficits and impairments:  Difficulty walking, Increased muscle spasms, Pain, Decreased balance, Impaired flexibility, Decreased strength, Postural dysfunction  Visit Diagnosis: Chronic low back pain, unspecified back pain laterality, unspecified whether sciatica present  Other muscle spasm  Difficulty in walking, not elsewhere classified  Muscle weakness (generalized)     Problem List Patient Active Problem List   Diagnosis Date Noted   (HFpEF) heart failure with preserved ejection fraction ([place] 08/06/2021   Stenosis of right carotid artery 05/26/2021   Osteoporosis 05/26/2021   Constipation 059/07/3111  Alcoholic peripheral neuropathy ([place] 02/08/2021   Seborrheic dermatitis, unspecified 02/19/2020   Unstable gait 10/20/2019   Dupuytren's contracture of right hand 09/29/2019   Chronic pain disorder 09/22/2019   Abnormal LFTs (liver function tests) 09/22/2019   Peripheral neuropathy 09/22/2019   History of syncope 09/21/2019   Benign essential hypertension 09/21/2019   Bilateral lower extremity edema 09/21/2019   Alcohol dependency ([place] 09/18/2018   Closed right trimalleolar fracture 09/18/2018   Depression 09/18/2018   Fall 09/18/2018   Syncope 09/18/2018   Nasal bones, closed fracture 08/07/2018   Hyperlipemia, mixed 04/04/2016   Low grade squamous intraepithelial lesion on cytologic smear of cervix (LGSIL) 04/04/2016    RScot Jun PTA 10/30/2021, 12:31 PM  [place] [address]Phone: [phone]  Fax:  [phone] Name: Rebecca PaulsenMRN: [id number] of Birth: 702-24-1947

## 2021-11-01 ENCOUNTER — Encounter: Payer: Self-pay | Admitting: Physical Therapy

## 2021-11-01 ENCOUNTER — Ambulatory Visit: Payer: Medicare PPO | Admitting: Physical Therapy

## 2021-11-01 ENCOUNTER — Other Ambulatory Visit: Payer: Self-pay

## 2021-11-01 DIAGNOSIS — M6281 Muscle weakness (generalized): Secondary | ICD-10-CM | POA: Diagnosis not present

## 2021-11-01 DIAGNOSIS — G8929 Other chronic pain: Secondary | ICD-10-CM | POA: Diagnosis not present

## 2021-11-01 DIAGNOSIS — M62838 Other muscle spasm: Secondary | ICD-10-CM | POA: Diagnosis not present

## 2021-11-01 DIAGNOSIS — G6289 Other specified polyneuropathies: Secondary | ICD-10-CM | POA: Diagnosis not present

## 2021-11-01 DIAGNOSIS — R262 Difficulty in walking, not elsewhere classified: Secondary | ICD-10-CM | POA: Diagnosis not present

## 2021-11-01 DIAGNOSIS — R2681 Unsteadiness on feet: Secondary | ICD-10-CM | POA: Diagnosis not present

## 2021-11-01 DIAGNOSIS — M545 Low back pain, unspecified: Secondary | ICD-10-CM | POA: Diagnosis not present

## 2021-11-01 NOTE — Therapy (Signed)
[place] [place], [place], [address] Phone: [phone]   Fax:  [phone]  Physical Therapy Treatment  Patient Details  Name: Rebecca Baird MRN: [id number] Date of Birth: [birth year] Referring Provider (PT): Dr Betty Martinique   Encounter Date: 11/01/2021   PT End of Session - 11/01/21 1341     Visit Number 5    Date for PT Re-Evaluation 12/08/21    Authorization Type [employer]    PT Start Time 1300    PT Stop Time 1344    PT Time Calculation (min) 44 min    Activity Tolerance Patient tolerated treatment well    Behavior During Therapy [facility] for tasks assessed/performed             Past Medical History:  Diagnosis Date   Anxiety    Depression    GERD (gastroesophageal reflux disease)    Hypertension    Peripheral neuropathy     Past Surgical History:  Procedure Laterality Date   broken ankle     cataract surgery     NOSE SURGERY      There were no vitals filed for this visit.   Subjective Assessment - 11/01/21 1300     Subjective "not great" "Neuropathy in my hands are getting worst and I don't know what that means for future" "My feet are already gone"    Currently in Pain? No/denies                               [facility] Adult PT Treatment/Exercise - 11/01/21 0001       Lumbar Exercises: Aerobic   Nustep L5 x 6 min      Lumbar Exercises: Machines for Strengthening   Cybex Knee Extension 5lb 2x10    Cybex Knee Flexion 25lb x10, x5, 20lb x5      Lumbar Exercises: Standing   Row Strengthening;20 reps;Theraband    Theraband Level (Row) Level 2 (Red)    Shoulder Extension Strengthening;Both;20 reps;Theraband    Theraband Level (Shoulder Extension) Level 2 (Red)      Knee/Hip Exercises: Standing   Walking with Sports Cord 20lb 4 way x 3 each   2 seated rest breaks to complete     Knee/Hip Exercises: Seated   Sit to Sand 2 sets;5 reps;without UE support    LE on airex                      PT Short Term Goals - 11/01/21 1304       PT SHORT TERM GOAL #1   Title Pt will be independent with initial HEP.    Status Achieved               PT Long Term Goals - 11/01/21 1304       PT LONG TERM GOAL #1   Title Pt will be independent with advanced HEP.    Status Partially Met      PT LONG TERM GOAL #2   Title Pt will increase FOTO to at least 52% to allow her decreased pain with functional activities.    Status On-going      PT LONG TERM GOAL #3   Title Pt will report no increased pain with ambulation or with TMJ area with functional activities.    Status Partially Met      PT LONG TERM GOAL #4  Status On-going      PT LONG TERM GOAL #5   Title Pt will increase balance to allow her to ambulate outside with LRAD to allow her to return to walking program.                   Plan - 11/01/21 1342     Clinical Impression Statement Pt did well overall today. Progressed to resisted gait but with some increase fatigue. Multiple rest breaks needed to complete sit to stand. Some posterior leaning and LOB with standing rows. Cues given not to rely on Tband to maintain stability. Some instability with sit to stands on BOSU    Comorbidities HTN,constipation, chronic pain, PAD, Incontinance of bladder with nocturia.    Examination-Activity Limitations Locomotion Level;Squat;Stairs;Stand    Examination-Participation Restrictions Community Activity    Stability/Clinical Decision Making Evolving/Moderate complexity    Rehab Potential Good    PT Frequency 2x / week    PT Duration 2 weeks    PT Treatment/Interventions ADLs/Self Care Home Management;Aquatic Therapy;Cryotherapy;Electrical Stimulation;Iontophoresis 74m/ml Dexamethasone;Moist Heat;Traction;Ultrasound;Gait training;Stair training;Functional mobility training;Therapeutic activities;Therapeutic exercise;Balance training;Neuromuscular re-education;Patient/family  education;Manual techniques;Passive range of motion;Dry needling;Taping;Vasopneumatic Device;Spinal Manipulations;Joint Manipulations    PT Next Visit Plan assess and progress HEP, strengthening, core stability             Patient will benefit from skilled therapeutic intervention in order to improve the following deficits and impairments:  Difficulty walking, Increased muscle spasms, Pain, Decreased balance, Impaired flexibility, Decreased strength, Postural dysfunction  Visit Diagnosis: Chronic low back pain, unspecified back pain laterality, unspecified whether sciatica present  Other muscle spasm  Muscle weakness (generalized)  Difficulty in walking, not elsewhere classified     Problem List Patient Active Problem List   Diagnosis Date Noted   (HFpEF) heart failure with preserved ejection fraction ([place] 08/06/2021   Stenosis of right carotid artery 05/26/2021   Osteoporosis 05/26/2021   Constipation 016/42/9037  Alcoholic peripheral neuropathy ([place] 02/08/2021   Seborrheic dermatitis, unspecified 02/19/2020   Unstable gait 10/20/2019   Dupuytren's contracture of right hand 09/29/2019   Chronic pain disorder 09/22/2019   Abnormal LFTs (liver function tests) 09/22/2019   Peripheral neuropathy 09/22/2019   History of syncope 09/21/2019   Benign essential hypertension 09/21/2019   Bilateral lower extremity edema 09/21/2019   Alcohol dependency ([place] 09/18/2018   Closed right trimalleolar fracture 09/18/2018   Depression 09/18/2018   Fall 09/18/2018   Syncope 09/18/2018   Nasal bones, closed fracture 08/07/2018   Hyperlipemia, mixed 04/04/2016   Low grade squamous intraepithelial lesion on cytologic smear of cervix (LGSIL) 04/04/2016    RScot Jun PTA 11/01/2021, 1:44 PM  [place] [address]Phone: [phone]  Fax:  [phone] Name: PMalikah PrincipatoMRN:  [id number] of Birth: 7Dec 08, 1947

## 2021-11-02 ENCOUNTER — Ambulatory Visit: Payer: Medicare PPO | Admitting: Cardiovascular Disease

## 2021-11-02 ENCOUNTER — Encounter: Payer: Self-pay | Admitting: Cardiovascular Disease

## 2021-11-02 VITALS — BP 122/74 | HR 92 | Ht 65.0 in | Wt 193.0 lb

## 2021-11-02 DIAGNOSIS — I5189 Other ill-defined heart diseases: Secondary | ICD-10-CM

## 2021-11-02 DIAGNOSIS — I10 Essential (primary) hypertension: Secondary | ICD-10-CM

## 2021-11-02 DIAGNOSIS — R0602 Shortness of breath: Secondary | ICD-10-CM

## 2021-11-02 DIAGNOSIS — I350 Nonrheumatic aortic (valve) stenosis: Secondary | ICD-10-CM

## 2021-11-02 DIAGNOSIS — E782 Mixed hyperlipidemia: Secondary | ICD-10-CM

## 2021-11-02 NOTE — Patient Instructions (Signed)
Medication Instructions:  No changes *If you need a refill on your cardiac medications before your next appointment, please call your pharmacy*   Lab Work: None ordered If you have labs (blood work) drawn today and your tests are completely normal, you will receive your results only by: MyChart Message (if you have MyChart) OR A paper copy in the mail If you have any lab test that is abnormal or we need to change your treatment, we will call you to review the results.   Testing/Procedures: None ordered   Follow-Up: At [facility], you and your health needs are our priority.  As part of our continuing mission to provide you with exceptional heart care, we have created designated Provider Care Teams.  These Care Teams include your primary Cardiologist (physician) and Advanced Practice Providers (APPs -  Physician Assistants and Nurse Practitioners) who all work together to provide you with the care you need, when you need it.  We recommend signing up for the patient portal called "MyChart".  Sign up information is provided on this After Visit Summary.  MyChart is used to connect with patients for Virtual Visits (Telemedicine).  Patients are able to view lab/test results, encounter notes, upcoming appointments, etc.  Non-urgent messages can be sent to your provider as well.   To learn more about what you can do with MyChart, go to [email or web address].    Your next appointment:   Follow up as needed with Dr. Croitoru  

## 2021-11-02 NOTE — Progress Notes (Signed)
Cardiology Office Note:    Date:  11/02/2021   ID:  Rebecca, Baird 1946-04-16, MRN [id number]  PCP:  Baird, Rebecca G, Baird   [facility] Providers Cardiologist:  None     Referring Baird: Baird, Rebecca G, Baird   No chief complaint on file. Rebecca Baird is a 75 y.o. female who is being seen today for the evaluation of abnormalities on echocardiogram at the request of Baird, Rebecca Expose, Baird.    History of Present Illness:    Rebecca Baird is a 74 y.o. female with a hx of anxiety, depression, GERD, essential hypertension, unexplained peripheral neuropathy.  She has a broad array of complaints, most of which do not have cardiovascular etiology.  Her biggest problem is severe lower back pain which limits her ability to walk.  She also complains of severe peripheral neuropathy involving both the upper and the lower extremities.  She has noticed difficulty swallowing so that she sometimes chokes on her own saliva.  She complains of congestion if she lies on her left side that improves if she turns over.  She does have lower extremity edema and she has been advised to use compression stockings, but her peripheral neuropathy prevents her from using these.  She has tried to wean herself off proton pump inhibitors, but predictably her symptoms of GERD flare back up.  In September 2022 she underwent an echocardiogram that showed normal left ventricular systolic function, mild diastolic dysfunction (grade 1, no evidence of elevated filling pressures), moderate mitral moderate calcification without stenosis/regurgitation, aortic valve sclerosis with very mild stenosis (calculated aortic valve area right at 2.0 cm, mean gradient 50 mmHg, peak velocity 2.6 m/second).  The dimensionless index was borderline at 0.52, consistent with very mild aortic stenosis.  The stroke-volume is relatively high, which explains the higher than expected gradients.  She denies chest pain either at  rest or with activity.  She has not been troubled by palpitations, dizziness or syncope.  She feels very unsteady due to her neuropathy but thankfully has not had falls or serious injuries.  She takes a statin for hypercholesterolemia and her blood pressure is being well treated with metoprolol, hydrochlorothiazide and valsartan.  Past Medical History:  Diagnosis Date   Anxiety    Depression    GERD (gastroesophageal reflux disease)    Hypertension    Peripheral neuropathy     Past Surgical History:  Procedure Laterality Date   broken ankle     cataract surgery     NOSE SURGERY      Current Medications: Current Meds  Medication Sig   alendronate (FOSAMAX) 70 MG tablet Take 1 tablet (70 mg total) by mouth every 7 (seven) days. Take with a full glass of water on an empty stomach.   Ascorbic Acid (VITAMIN C) 1000 MG tablet Take 1,000 mg by mouth daily.   Cyanocobalamin (VITAMIN B12 PO) Take 5,000 mg by mouth daily.   desonide (DESOWEN) 0.05 % cream APPLY TO FACE TOPICALLY DAILY AS NEEDED   folic acid (FOLVITE) 800 MCG tablet Take 400 mcg by mouth daily.   furosemide (LASIX) 20 MG tablet TAKE 1 TABLET BY MOUTH EVERY DAY   gabapentin (NEURONTIN) 300 MG capsule TAKE 1 CAPSULE BY MOUTH THREE TIMES A DAY   hydrochlorothiazide (HYDRODIURIL) 25 MG tablet Take 1 tablet (25 mg total) by mouth daily.   metoprolol succinate (TOPROL-XL) 25 MG 24 hr tablet TAKE 1 TABLET BY MOUTH EVERY DAY   omeprazole (PRILOSEC) 20  MG capsule Take 1 capsule (20 mg total) by mouth daily.   predniSONE (DELTASONE) 10 MG tablet TAKE 1 TABLET (10 MG TOTAL) BY MOUTH DAILY WITH BREAKFAST.   rosuvastatin (CRESTOR) 10 MG tablet TAKE 1 TABLET BY MOUTH EVERY DAY   tapentadol (NUCYNTA) 100 MG 12 hr tablet Take 100 mg by mouth every 12 (twelve) hours.   thiamine (VITAMIN B-1) 100 MG tablet Take 100 mg by mouth daily.   triamcinolone cream (KENALOG) 0.1 % Apply 1 application topically 2 (two) times daily as needed. Legs    valsartan (DIOVAN) 160 MG tablet TAKE 1 TABLET BY MOUTH EVERY DAY     Allergies:   Epinephrine   Social History   Socioeconomic History   Marital status: Married    Spouse name: Not on file   Number of children: 1   Years of education: Not on file   Highest education level: Associate degree: academic program  Occupational History   Occupation: retired  Tobacco Use   Smoking status: Former    Types: Cigarettes   Smokeless tobacco: Never  Vaping Use   Vaping Use: Never used  Substance and Sexual Activity   Alcohol use: Yes    Alcohol/week: 3.0 standard drinks    Types: 3 Glasses of wine per week   Drug use: Not on file   Sexual activity: Not on file  Other Topics Concern   Not on file  Social History Narrative   Right handed   One story home   Drinks occasional caffeine   Social Determinants of Health   Financial Resource Strain: Low Risk    Difficulty of Paying Living Expenses: Not hard at all  Food Insecurity: No Food Insecurity   Worried About Programme researcher, broadcasting/film/video in the Last Year: Never true   Ran Out of Food in the Last Year: Never true  Transportation Needs: No Transportation Needs   Lack of Transportation (Medical): No   Lack of Transportation (Non-Medical): No  Physical Activity: Inactive   Days of Exercise per Week: 0 days   Minutes of Exercise per Session: 0 min  Stress: No Stress Concern Present   Feeling of Stress : Not at all  Social Connections: Moderately Isolated   Frequency of Communication with Friends and Family: More than three times a week   Frequency of Social Gatherings with Friends and Family: Never   Attends Religious Services: Never   Database administrator or Organizations: No   Attends Engineer, structural: Never   Marital Status: Married     Family History: The patient's family history includes Breast cancer in her cousin and mother.  ROS:   Please see the history of present illness.     All other systems reviewed and  are negative.  EKGs/Labs/Other Studies Reviewed:    The following studies were reviewed today: ECHO 08/01/2021  1. Left ventricular ejection fraction, by estimation, is 60 to 65%. The  left ventricle has normal function. The left ventricle has no regional  wall motion abnormalities. Left ventricular diastolic parameters are  consistent with Grade I diastolic  dysfunction (impaired relaxation).   2. Right ventricular systolic function is normal. The right ventricular  size is normal. There is normal pulmonary artery systolic pressure.   3. The mitral valve is normal in structure. No evidence of mitral valve  regurgitation. No evidence of mitral stenosis. Moderate mitral annular  calcification.   4. The aortic valve is tricuspid. Aortic valve regurgitation is not  visualized.  Mild aortic valve stenosis. Aortic valve area, by VTI measures  1.99 cm. Aortic valve mean gradient measures 15.0 mmHg. Aortic valve Vmax  measures 2.58 m/s.   5. The inferior vena cava is normal in size with greater than 50%  respiratory variability, suggesting right atrial pressure of 3 mmHg.   EKG:  EKG is  ordered today.  The ekg ordered today demonstrates NSR, normal tracing  Recent Labs: 02/08/2021: ALT 22; Hemoglobin 11.9; Platelets 220.0; TSH 0.95 06/27/2021: BUN 32; Creatinine, Ser 1.27; Potassium 4.6; Pro B Natriuretic peptide (BNP) 46.0; Sodium 137  Recent Lipid Panel    Component Value Date/Time   CHOL 213 (H) 06/27/2021 0832   TRIG 184.0 (H) 06/27/2021 0832   HDL 79.40 06/27/2021 0832   CHOLHDL 3 06/27/2021 0832   VLDL 36.8 06/27/2021 0832   LDLCALC 97 06/27/2021 0832     Risk Assessment/Calculations:           Physical Exam:    VS:  BP 122/74    Pulse 92    Ht  (1.651 m)    Wt 193 lb (87.5 kg)    SpO2 94%    BMI 32.12 kg/m     Wt Readings from Last 3 Encounters:  11/02/21 193 lb (87.5 kg)  10/09/21 194 lb 6.4 oz (88.2 kg)  07/11/21 179 lb (81.2 kg)     GEN:  Well nourished,  well developed in no acute distress HEENT: Normal NECK: No JVD; No carotid bruits LYMPHATICS: No lymphadenopathy CARDIAC: RRR, S1 and S2, early peaking 2/6 aortic ejection murmur heard throughout the anterior precordium, loudest at the right upper sternal border, no diastolic murmurs, rubs, gallops RESPIRATORY:  Clear to auscultation without rales, wheezing or rhonchi  ABDOMEN: Soft, non-tender, non-distended MUSCULOSKELETAL:  No edema; No deformity  SKIN: Warm and dry NEUROLOGIC:  Alert and oriented x 3 PSYCHIATRIC:  Normal affect   ASSESSMENT:    1. Shortness of breath   2. Aortic valve stenosis, nonrheumatic   3. Diastolic dysfunction   4. Benign essential hypertension   5. Hyperlipemia, mixed    PLAN:    In order of problems listed above:  AS: This is very mild by all echo criteria except for the mean gradient which is in the mild-moderate range, probably because she has a relatively high cardiac output.  Unlikely to be contributing to any of her symptoms at this time.  Should be monitored with another echocardiogram in 2-3 years, sooner if she develops exertional angina/exertional dyspnea/exertional syncope. Diastolic dysfunction: In fact, when adjusted for age, her diastolic function parameters are not that bad.  She does not have any clinical heart failure.  She does not have any echo evidence of elevated filling pressures.  She also had a normal BNP earlier this year. HTN: Well treated. HLP: On a highly active statin.           Medication Adjustments/Labs and Tests Ordered: Current medicines are reviewed at length with the patient today.  Concerns regarding medicines are outlined above.  Orders Placed This Encounter  Procedures   EKG 12-Lead   No orders of the defined types were placed in this encounter.   Patient Instructions  Medication Instructions:  No changes *If you need a refill on your cardiac medications before your next appointment, please call your  pharmacy*   Lab Work: None ordered If you have labs (blood work) drawn today and your tests are completely normal, you will receive your results only by: [employer] (  if you have MyChart) OR A paper copy in the mail If you have any lab test that is abnormal or we need to change your treatment, we will call you to review the results.   Testing/Procedures: None ordered   Follow-Up: At [facility], you and your health needs are our priority.  As part of our continuing mission to provide you with exceptional heart care, we have created designated Provider Care Teams.  These Care Teams include your primary Cardiologist (physician) and Advanced Practice Providers (APPs -  Physician Assistants and Nurse Practitioners) who all work together to provide you with the care you need, when you need it.  We recommend signing up for the patient portal called "MyChart".  Sign up information is provided on this After Visit Summary.  MyChart is used to connect with patients for Virtual Visits (Telemedicine).  Patients are able to view lab/test results, encounter notes, upcoming appointments, etc.  Non-urgent messages can be sent to your provider as well.   To learn more about what you can do with MyChart, go to [email or web address].    Your next appointment:   Follow up as needed with Dr. Royann Shivers   Signed, Thurmon Fair, Baird  11/02/2021 2:20 PM    [facility]

## 2021-11-03 ENCOUNTER — Ambulatory Visit
Admission: RE | Admit: 2021-11-03 | Discharge: 2021-11-03 | Disposition: A | Discharge status: Home / Self Care | Payer: Medicare PPO | Source: Ambulatory Visit | Attending: Family Medicine | Admitting: Family Medicine

## 2021-11-03 ENCOUNTER — Other Ambulatory Visit: Payer: Self-pay

## 2021-11-03 DIAGNOSIS — M545 Low back pain, unspecified: Secondary | ICD-10-CM | POA: Diagnosis not present

## 2021-11-03 DIAGNOSIS — M48061 Spinal stenosis, lumbar region without neurogenic claudication: Secondary | ICD-10-CM | POA: Diagnosis not present

## 2021-11-07 ENCOUNTER — Ambulatory Visit: Payer: Medicare PPO | Admitting: Physical Therapy

## 2021-11-07 ENCOUNTER — Other Ambulatory Visit: Payer: Self-pay

## 2021-11-07 DIAGNOSIS — M62838 Other muscle spasm: Secondary | ICD-10-CM

## 2021-11-07 DIAGNOSIS — R2681 Unsteadiness on feet: Secondary | ICD-10-CM | POA: Diagnosis not present

## 2021-11-07 DIAGNOSIS — M545 Low back pain, unspecified: Secondary | ICD-10-CM | POA: Diagnosis not present

## 2021-11-07 DIAGNOSIS — M6281 Muscle weakness (generalized): Secondary | ICD-10-CM

## 2021-11-07 DIAGNOSIS — R262 Difficulty in walking, not elsewhere classified: Secondary | ICD-10-CM

## 2021-11-07 DIAGNOSIS — G8929 Other chronic pain: Secondary | ICD-10-CM | POA: Diagnosis not present

## 2021-11-07 DIAGNOSIS — G6289 Other specified polyneuropathies: Secondary | ICD-10-CM | POA: Diagnosis not present

## 2021-11-07 NOTE — Therapy (Signed)
[place], [place], [address] Phone: [phone]   Fax:  [phone]  Physical Therapy Treatment  Patient Details  Name: Rebecca Baird MRN: [id number] Date of Birth: 02/18/1946 Referring Provider (PT): Dr Betty Martinique   Encounter Date: 11/07/2021   PT End of Session - 11/07/21 1308     Visit Number 6    Date for PT Re-Evaluation 12/08/21    Authorization Type [employer]    Progress Note Due on Visit 10    PT Start Time 1310    PT Stop Time 1355    PT Time Calculation (min) 45 min    Activity Tolerance Patient tolerated treatment well    Behavior During Therapy [facility] for tasks assessed/performed             Past Medical History:  Diagnosis Date   Anxiety    Depression    GERD (gastroesophageal reflux disease)    Hypertension    Peripheral neuropathy     Past Surgical History:  Procedure Laterality Date   broken ankle     cataract surgery     NOSE SURGERY      There were no vitals filed for this visit.   Subjective Assessment - 11/07/21 1315     Subjective "This is the only exercise I get. My back hurts too much to stand for longer than 2 minutes." Pt had MRI on Friday and should get results soon. Saw cardiology last Thursday who did not think she needed a cardiologist. Most pain in hands. LEs getting number.    Pertinent History HTN, constipation, chronic pain, and PAD.    Limitations Standing;Walking;Lifting    How long can you stand comfortably? 2 min    How long can you walk comfortably? 2 minutes inside home.  Pt used cane inside home    Currently in Pain? Yes    Pain Score 3     Pain Location Back                [facility] PT Assessment - 11/07/21 0001       Assessment   Medical Diagnosis G62.89 (ICD-10-CM) - Other polyneuropathy  R26.81 (ICD-10-CM) - Unstable gait    Referring Provider (PT) Dr Betty Martinique                           [facility]  Adult PT Treatment/Exercise - 11/07/21 0001       Lumbar Exercises: Stretches   Passive Hamstring Stretch Right;Left;30 seconds    Single Knee to Chest Stretch --    Double Knee to Chest Stretch 30 seconds    Lower Trunk Rotation 3 reps;10 seconds      Lumbar Exercises: Aerobic   Nustep L4 x 5 min      Lumbar Exercises: Machines for Strengthening   Cybex Knee Extension 10lb x5; 5lbs 2x10    Cybex Knee Flexion 25lb 2x10    Leg Press 10lb x10; 30 lb 2x10    Other Lumbar Machine Exercise row 3x10 15lbs      Lumbar Exercises: Seated   Other Seated Lumbar Exercises pball press down 2x10x3"      Lumbar Exercises: Sidelying   Clam Right;Left;20 reps      Knee/Hip Exercises: Standing   Walking with Sports Cord 20lb 4 way x 3 each  PT Short Term Goals - 11/01/21 1304       PT SHORT TERM GOAL #1   Title Pt will be independent with initial HEP.    Status Achieved               PT Long Term Goals - 11/01/21 1304       PT LONG TERM GOAL #1   Title Pt will be independent with advanced HEP.    Status Partially Met      PT LONG TERM GOAL #2   Title Pt will increase FOTO to at least 52% to allow her decreased pain with functional activities.    Status On-going      PT LONG TERM GOAL #3   Title Pt will report no increased pain with ambulation or with TMJ area with functional activities.    Status Partially Met      PT LONG TERM GOAL #4   Status On-going      PT LONG TERM GOAL #5   Title Pt will increase balance to allow her to ambulate outside with LRAD to allow her to return to walking program.                   Plan - 11/07/21 1332     Clinical Impression Statement Pt tolerated exercises well. Pt challenged and fatigued with clamshells. Worked on gentle low back stretching -- highly taut thoracolumbar paraspinals noted. COuld benefit from [facility] and manual therapy. Provided pt HEP.    Comorbidities HTN,constipation, chronic  pain, PAD, Incontinance of bladder with nocturia.    Examination-Activity Limitations Locomotion Level;Squat;Stairs;Stand    Examination-Participation Restrictions Community Activity    Stability/Clinical Decision Making Evolving/Moderate complexity    Rehab Potential Good    PT Frequency 2x / week    PT Duration 2 weeks    PT Treatment/Interventions ADLs/Self Care Home Management;Aquatic Therapy;Cryotherapy;Electrical Stimulation;Iontophoresis 108m/ml Dexamethasone;Moist Heat;Traction;Ultrasound;Gait training;Stair training;Functional mobility training;Therapeutic activities;Therapeutic exercise;Balance training;Neuromuscular re-education;Patient/family education;Manual techniques;Passive range of motion;Dry needling;Taping;Vasopneumatic Device;Spinal Manipulations;Joint Manipulations    PT Next Visit Plan assess and progress HEP, strengthening, core stability. Manual therapy/TPDN/stretching for lumbar paraspinals.    PT Home Exercise Plan Access Code: [email or web address]   Consulted and Agree with Plan of Care Patient             Patient will benefit from skilled therapeutic intervention in order to improve the following deficits and impairments:  Difficulty walking, Increased muscle spasms, Pain, Decreased balance, Impaired flexibility, Decreased strength, Postural dysfunction  Visit Diagnosis: Chronic low back pain, unspecified back pain laterality, unspecified whether sciatica present  Other muscle spasm  Muscle weakness (generalized)  Difficulty in walking, not elsewhere classified     Problem List Patient Active Problem List   Diagnosis Date Noted   (HFpEF) heart failure with preserved ejection fraction ([place] 08/06/2021   Stenosis of right carotid artery 05/26/2021   Osteoporosis 05/26/2021   Constipation 018/84/1660  Alcoholic peripheral neuropathy ([place] 02/08/2021   Seborrheic dermatitis, unspecified 02/19/2020   Unstable gait 10/20/2019   Dupuytren's contracture of right hand  09/29/2019   Chronic pain disorder 09/22/2019   Abnormal LFTs (liver function tests) 09/22/2019   Peripheral neuropathy 09/22/2019   History of syncope 09/21/2019   Benign essential hypertension 09/21/2019   Bilateral lower extremity edema 09/21/2019   Alcohol dependency ([facility] 09/18/2018   Closed right trimalleolar fracture 09/18/2018   Depression 09/18/2018   Fall 09/18/2018   Syncope 09/18/2018   Nasal bones, closed fracture 08/07/2018   Hyperlipemia,  mixed 04/04/2016   Low grade squamous intraepithelial lesion on cytologic smear of cervix (LGSIL) 04/04/2016    Denisa Enterline April Gordy Levan, PT, DPT 11/07/2021, 2:10 PM  [place] [place], [place], [address] Phone: [phone]   Fax:  [phone]  Name: Tramya Schoenfelder MRN: [id number] Date of Birth: [birth year]

## 2021-11-07 NOTE — Progress Notes (Signed)
HPI:  Rebecca Baird is a 75 y.o. female, who is here today for follow up. She was last seen on 10/09/21 for acute visit, at this time she was concerned about worsening back pain, she has stated PT and had lumbar MRI done. Lumbar MRI report has been faxed to her chronic pain manager, she would like to go through results. PT just started, she is hoping this will help her to "get rib" of her chronic back pain. She would like to have continues PT through the year. Lumbar MRI 11/03/21: 1. Mild spinal canal stenosis at L4-L5 due to combination of disc bulge and facet arthrosis. 2. Small left subarticular disc protrusion at L2-L3 narrowing the left lateral recess. Correlate for left L3 radiculopathy. 3. Moderate facet arthrosis at L4-5 and L5-S1 may be a source of local low back pain.  Depression and anxiety: She was on Celexa 10 mg, she did not feel like she needed, so weaned off in 07/2020. Problem exacerbated by health issues.  She is very frustrated because she cannot stand up for more than a minute, exacerbated pain. Pain is limiting ADL's. She is also under a lot of stress, her sister just underwent mastectomy for breast cancer. She is on Tapentadol 100 mg bid.  HTN on HCTZ 25 mg daily, Metoprolol succinate 25 mg daily,and Valsartan 160 mg daily. Negative for severe/frequent headache, visual changes, chest pain, palpitation,or new focal neurologic/focal weakness. + Intermittent lightheadedness, chronic.  Lab Results  Component Value Date   CREATININE 1.27 (H) 06/27/2021   BUN 32 (H) 06/27/2021   NA 137 06/27/2021   K 4.6 06/27/2021   CL 100 06/27/2021   CO2 29 06/27/2021   C/O "raspy/heavy" breathing, having trouble "catching" her breath. She is reporting this problem as new since her last visit. She also saw cardiologist for SOB, states that she does not remember addressing this problem. Nasal congestion and post nasal drainage. "Little" wheezing sometimes, this is  also new. Negative for fever,chills,or sore throat. No sick contact. No hx of allergies.  She would like to stop Prednisone. Medication was started by ortho in 08/2021. She has not arranged f/u visit. She is on Prednisone 10 mg daily and she does not think medication is helping with arthralgias or back pain.  Polyneuropathy: She is on Gabapentin 300 mg tid. Thought to be alcohol related. Numbness, tingling, and burning sensation on lower extremities and hands.  Evaluated by neurologist, last seen 12/14/19. She underwent NCS/EMG of LE's at [facility]. She does not feel like Gabapentin is helping.  Lower extremities edema: Edema "still bad."  She is not sure if it is worse at the end of the day. No erythema or skin lesions. She is not wearing compression stocking. She is on Furosemide 20 mg daily. Negative for orthopnea,PND,decreased urine output,or gross hematuria.  Review of Systems  Constitutional:  Positive for fatigue. Negative for appetite change and fever.  HENT:  Negative for mouth sores, nosebleeds and trouble swallowing.   Respiratory:  Negative for cough and stridor.   Gastrointestinal:  Negative for abdominal pain, nausea and vomiting.       Negative for changes in bowel habits.  Endocrine: Negative for cold intolerance and heat intolerance.  Musculoskeletal:  Positive for arthralgias, back pain and gait problem.  Skin:  Negative for pallor and rash.  Neurological:  Negative for syncope and facial asymmetry.  Psychiatric/Behavioral:  Negative for confusion and hallucinations. The patient is nervous/anxious.   Rest see pertinent positives and  negatives per HPI.  Current Outpatient Medications on File Prior to Visit  Medication Sig Dispense Refill   alendronate (FOSAMAX) 70 MG tablet Take 1 tablet (70 mg total) by mouth every 7 (seven) days. Take with a full glass of water on an empty stomach. 13 tablet 2   Ascorbic Acid (VITAMIN C) 1000 MG tablet Take 1,000 mg by  mouth daily.     Cyanocobalamin (VITAMIN B12 PO) Take 5,000 mg by mouth daily.     desonide (DESOWEN) 0.05 % cream APPLY TO FACE TOPICALLY DAILY AS NEEDED 30 g 1   folic acid (FOLVITE) Q000111Q MCG tablet Take 400 mcg by mouth daily.     furosemide (LASIX) 20 MG tablet TAKE 1 TABLET BY MOUTH EVERY DAY 90 tablet 2   gabapentin (NEURONTIN) 300 MG capsule TAKE 1 CAPSULE BY MOUTH THREE TIMES A DAY 90 capsule 3   hydrochlorothiazide (HYDRODIURIL) 25 MG tablet Take 1 tablet (25 mg total) by mouth daily. 90 tablet 1   metoprolol succinate (TOPROL-XL) 25 MG 24 hr tablet TAKE 1 TABLET BY MOUTH EVERY DAY 90 tablet 2   omeprazole (PRILOSEC) 20 MG capsule Take 1 capsule (20 mg total) by mouth daily. 90 capsule 2   predniSONE (DELTASONE) 10 MG tablet TAKE 1 TABLET (10 MG TOTAL) BY MOUTH DAILY WITH BREAKFAST. 30 tablet 2   rosuvastatin (CRESTOR) 10 MG tablet TAKE 1 TABLET BY MOUTH EVERY DAY 90 tablet 2   tapentadol (NUCYNTA) 100 MG 12 hr tablet Take 100 mg by mouth every 12 (twelve) hours.     thiamine (VITAMIN B-1) 100 MG tablet Take 100 mg by mouth daily.     triamcinolone cream (KENALOG) 0.1 % Apply 1 application topically 2 (two) times daily as needed. Legs 45 g 1   valsartan (DIOVAN) 160 MG tablet TAKE 1 TABLET BY MOUTH EVERY DAY 90 tablet 1   No current facility-administered medications on file prior to visit.   Past Medical History:  Diagnosis Date   Anxiety    Depression    GERD (gastroesophageal reflux disease)    Hypertension    Peripheral neuropathy    Allergies  Allergen Reactions   Epinephrine Other (See Comments) and Palpitations    "Heart Races Uncomfortably"     Social History   Socioeconomic History   Marital status: Married    Spouse name: Not on file   Number of children: 1   Years of education: Not on file   Highest education level: Associate degree: academic program  Occupational History   Occupation: retired  Tobacco Use   Smoking status: Former    Types: Cigarettes    Smokeless tobacco: Never  Vaping Use   Vaping Use: Never used  Substance and Sexual Activity   Alcohol use: Yes    Alcohol/week: 3.0 standard drinks    Types: 3 Glasses of wine per week   Drug use: Not on file   Sexual activity: Not on file  Other Topics Concern   Not on file  Social History Narrative   Right handed   One story home   Drinks occasional caffeine   Social Determinants of Health   Financial Resource Strain: Low Risk    Difficulty of Paying Living Expenses: Not hard at all  Food Insecurity: No Food Insecurity   Worried About Charity fundraiser in the Last Year: Never true   Ran Out of Food in the Last Year: Never true  Transportation Needs: No Transportation Needs   Lack of  Transportation (Medical): No   Lack of Transportation (Non-Medical): No  Physical Activity: Inactive   Days of Exercise per Week: 0 days   Minutes of Exercise per Session: 0 min  Stress: No Stress Concern Present   Feeling of Stress : Not at all  Social Connections: Moderately Isolated   Frequency of Communication with Friends and Family: More than three times a week   Frequency of Social Gatherings with Friends and Family: Never   Attends Religious Services: Never   Marine scientist or Organizations: No   Attends Archivist Meetings: Never   Marital Status: Married   Vitals:   11/08/21 1420  BP: 120/70  Pulse: (!) 108  Resp: 16  SpO2: 98%   Body mass index is 32.12 kg/m.  Physical Exam Vitals and nursing note reviewed.  Constitutional:      General: She is not in acute distress.    Appearance: She is well-developed.  HENT:     Head: Normocephalic and atraumatic.     Mouth/Throat:     Mouth: Mucous membranes are moist.     Pharynx: Oropharynx is clear.  Eyes:     Conjunctiva/sclera: Conjunctivae normal.  Cardiovascular:     Rate and Rhythm: Regular rhythm. Tachycardia present.     Heart sounds: Murmur (SEM II/VI RUSB and LUSB) heard.     Comments: DP  present bilateral. Pulmonary:     Effort: Pulmonary effort is normal. No respiratory distress.     Breath sounds: Normal breath sounds.  Abdominal:     Palpations: Abdomen is soft.     Tenderness: There is no abdominal tenderness.  Musculoskeletal:     Thoracic back: No tenderness or bony tenderness.     Lumbar back: No tenderness or bony tenderness. Decreased range of motion.     Right lower leg: 2+ Pitting Edema present.     Left lower leg: 2+ Pitting Edema present.  Lymphadenopathy:     Cervical: No cervical adenopathy.  Skin:    General: Skin is warm.     Findings: No erythema or rash.  Neurological:     General: No focal deficit present.     Mental Status: She is alert and oriented to person, place, and time.     Cranial Nerves: No cranial nerve deficit.     Gait: Gait normal.     Comments: Unstable gait assisted by a cane.  Psychiatric:        Mood and Affect: Affect is labile.     Comments: Well groomed, good eye contact.   ASSESSMENT AND PLAN:  Ms.Mikiah was seen today for follow-up. Diagnoses and all orders for this visit:  Orders Placed This Encounter  Procedures   DG Chest 2 View   Peripheral neuropathy Gabapentin is not helping, so we will start weaning it off. If problem gets worse, she will let me know. Appropriate skin care. Fall precautions.  Chronic low back pain without sciatica, unspecified back pain laterality Pain is not well controlled. We reviewed lumbar MRI report. Explained that this is a chronic problem, even though PT can greatly help with problem ( and I hope it does), the likelihood of this "getting rib" of pain is not a feasible expectation. We can continue PT as far as needed, she will let me know when new referral is needed. Recommend discussing pain regimen, it may need to be adjusted.  SOB (shortness of breath) We discussed possible etiologies. Nasal congestion could be a contributing factor.  ?  Deconditioning. Former smoker and  reporting "little" wheezing sometimes, so COPD is in the differential Dx's. CXR ordered today. Albuterol inh will be sent to her pharmacy to use as needed for wheezing/SOB.  Bilateral lower extremity edema We discussed possible causes. Venous insufficiency most likely. We discussed some side effects of Furosemide, so I prefer not to increase dose for now. Strongly recommend trying compression stockings. Vascular/vain specialist can be consulted, she prefers to hold on referral. LE elevation above heart level will also help.  Benign essential hypertension BP adequately controlled. Continue HCTZ 25 mg daily, Metoprolol succinate 25 mg daily,and Valsartan 160 mg daily. Continue low salt/DASH diet.  Mild episode of recurrent major depressive disorder (HCC) Exacerbated by health conditions. We can consider resuming medication, Celexa. CBT recommended, she will think about it. Gabapentin may aggravate problem.  Nasal congestion Nasal saline irrigations and Flonase nasal spray daily as needed may help  I spent a total of 42 minutes in both face to face and non face to face activities for this visit on the date of this encounter. During this time history was obtained and documented, examination was performed, prior labs/imaging reviewed, and assessment/plan discussed. Will start weaning off prednisone.  Return in about 4 months (around 03/09/2022).  Devlin Mcveigh G. [place], MD  [facility]. [place] office.

## 2021-11-08 ENCOUNTER — Ambulatory Visit: Payer: Medicare PPO | Admitting: Family Medicine

## 2021-11-08 ENCOUNTER — Encounter: Payer: Self-pay | Admitting: Family Medicine

## 2021-11-08 ENCOUNTER — Ambulatory Visit (INDEPENDENT_AMBULATORY_CARE_PROVIDER_SITE_OTHER): Payer: Medicare PPO

## 2021-11-08 VITALS — BP 120/70 | HR 108 | Resp 16 | Ht 65.0 in | Wt 193.0 lb

## 2021-11-08 DIAGNOSIS — I10 Essential (primary) hypertension: Secondary | ICD-10-CM

## 2021-11-08 DIAGNOSIS — R0981 Nasal congestion: Secondary | ICD-10-CM | POA: Diagnosis not present

## 2021-11-08 DIAGNOSIS — F330 Major depressive disorder, recurrent, mild: Secondary | ICD-10-CM

## 2021-11-08 DIAGNOSIS — R0602 Shortness of breath: Secondary | ICD-10-CM

## 2021-11-08 DIAGNOSIS — R9389 Abnormal findings on diagnostic imaging of other specified body structures: Secondary | ICD-10-CM | POA: Diagnosis not present

## 2021-11-08 DIAGNOSIS — R600 Localized edema: Secondary | ICD-10-CM

## 2021-11-08 DIAGNOSIS — G6289 Other specified polyneuropathies: Secondary | ICD-10-CM

## 2021-11-08 DIAGNOSIS — G8929 Other chronic pain: Secondary | ICD-10-CM

## 2021-11-08 DIAGNOSIS — R062 Wheezing: Secondary | ICD-10-CM | POA: Diagnosis not present

## 2021-11-08 DIAGNOSIS — R918 Other nonspecific abnormal finding of lung field: Secondary | ICD-10-CM | POA: Diagnosis not present

## 2021-11-08 DIAGNOSIS — M545 Low back pain, unspecified: Secondary | ICD-10-CM

## 2021-11-08 DIAGNOSIS — I700 Atherosclerosis of aorta: Secondary | ICD-10-CM | POA: Diagnosis not present

## 2021-11-08 NOTE — Assessment & Plan Note (Addendum)
Gabapentin is not helping, so we will start weaning it off. If problem gets worse, she will let me know. Appropriate skin care. Fall precautions.

## 2021-11-08 NOTE — Patient Instructions (Addendum)
A few things to remember from today's visit:  Benign essential hypertension  Other polyneuropathy  Bilateral lower extremity edema  SOB (shortness of breath) - Plan: DG Chest 2 View  If you need refills please call your pharmacy. Do not use My Chart to request refills or for acute issues that need immediate attention.   Prednisone to start weaning off, so take 1/2 tab daily x 10 days, then every other day for 10 days,then every 2 days for 10 days,then every 3rd day for 10 days.  Gabapentin 300 mg  decreased from 3 times daily to 2 daily for 10 days, then once daily for 10 days,then every other day for 10 days and stop.  Discuss lumbar MRI results with pain management. Wear compression stockings. Lower extremities elevation above heart level may also help with swelling.  Please be sure medication list is accurate. If a new problem present, please set up appointment sooner than planned today.

## 2021-11-09 ENCOUNTER — Encounter: Payer: Self-pay | Admitting: Physical Therapy

## 2021-11-09 ENCOUNTER — Other Ambulatory Visit: Payer: Self-pay

## 2021-11-09 ENCOUNTER — Encounter: Payer: Self-pay | Admitting: Family Medicine

## 2021-11-09 ENCOUNTER — Ambulatory Visit: Payer: Medicare PPO | Admitting: Physical Therapy

## 2021-11-09 ENCOUNTER — Telehealth: Payer: Self-pay | Admitting: Family Medicine

## 2021-11-09 DIAGNOSIS — M6281 Muscle weakness (generalized): Secondary | ICD-10-CM

## 2021-11-09 DIAGNOSIS — R262 Difficulty in walking, not elsewhere classified: Secondary | ICD-10-CM

## 2021-11-09 DIAGNOSIS — G8929 Other chronic pain: Secondary | ICD-10-CM | POA: Diagnosis not present

## 2021-11-09 DIAGNOSIS — G6289 Other specified polyneuropathies: Secondary | ICD-10-CM | POA: Diagnosis not present

## 2021-11-09 DIAGNOSIS — R2681 Unsteadiness on feet: Secondary | ICD-10-CM | POA: Diagnosis not present

## 2021-11-09 DIAGNOSIS — M62838 Other muscle spasm: Secondary | ICD-10-CM

## 2021-11-09 DIAGNOSIS — M545 Low back pain, unspecified: Secondary | ICD-10-CM | POA: Diagnosis not present

## 2021-11-09 MED ORDER — FLUTICASONE PROPIONATE 50 MCG/ACT NA SUSP: 2.0000 | Freq: Every day | NASAL | 2 refills | Status: DC

## 2021-11-09 MED ORDER — ALBUTEROL SULFATE HFA 108 (90 BASE) MCG/ACT IN AERS: 2.0000 | INHALATION_SPRAY | Freq: Four times a day (QID) | RESPIRATORY_TRACT | 0 refills | Status: DC | PRN

## 2021-11-09 NOTE — Therapy (Signed)
[place]. [place], [place], [address] Phone: [phone]   Fax:  [phone]  Physical Therapy Treatment  Patient Details  Name: Marguerite Barba MRN: [id number] Date of Birth: [birth year] Referring Provider (PT): Dr Betty Martinique   Encounter Date: 11/09/2021   PT End of Session - 11/09/21 1339     Visit Number 7    Date for PT Re-Evaluation 12/08/21    PT Start Time 1300    PT Stop Time 1343    PT Time Calculation (min) 43 min    Activity Tolerance Patient tolerated treatment well    Behavior During Therapy [facility] for tasks assessed/performed             Past Medical History:  Diagnosis Date   Anxiety    Depression    GERD (gastroesophageal reflux disease)    Hypertension    Peripheral neuropathy     Past Surgical History:  Procedure Laterality Date   broken ankle     cataract surgery     NOSE SURGERY      There were no vitals filed for this visit.   Subjective Assessment - 11/09/21 1259     Subjective Talked to PCP and was told that she could not be helped with the pain.    Currently in Pain? Yes    Pain Score 5     Pain Location Hand    Pain Orientation Right;Left                               [facility] Adult PT Treatment/Exercise - 11/09/21 0001       Lumbar Exercises: Aerobic   Nustep L4 x 5 min      Lumbar Exercises: Machines for Strengthening   Cybex Knee Extension 5lbs x10    Cybex Knee Flexion 25lb 2x12      Lumbar Exercises: Seated   Other Seated Lumbar Exercises pball press down 2x10x3"      Knee/Hip Exercises: Standing   Walking with Sports Cord 20lb 4 way x 3 each      Knee/Hip Exercises: Seated   Sit to Sand 2 sets;10 reps;without UE support   holding yellow ball                      PT Short Term Goals - 11/01/21 1304       PT SHORT TERM GOAL #1   Title Pt will be independent with initial HEP.    Status Achieved                PT Long Term Goals - 11/01/21 1304       PT LONG TERM GOAL #1   Title Pt will be independent with advanced HEP.    Status Partially Met      PT LONG TERM GOAL #2   Title Pt will increase FOTO to at least 52% to allow her decreased pain with functional activities.    Status On-going      PT LONG TERM GOAL #3   Title Pt will report no increased pain with ambulation or with TMJ area with functional activities.    Status Partially Met      PT LONG TERM GOAL #4   Status On-going      PT LONG TERM GOAL #5   Title Pt will increase balance to allow her to ambulate outside with LRAD  to allow her to return to walking program.                   Plan - 11/09/21 1340     Clinical Impression Statement Pt emotional at beginning of session due to what her PCP told her. Despite being emotional she did complete all interventions. Some instability, weakness, and fatigue present with resisted gait. Sit to stands also exposed pt to fatigue and weakness, requiring cues for anterior weight shifts. Cue to breath needed with ab sets.    Personal Factors and Comorbidities Comorbidity 3+    Comorbidities HTN,constipation, chronic pain, PAD, Incontinance of bladder with nocturia.    Examination-Activity Limitations Locomotion Level;Squat;Stairs;Stand    Examination-Participation Restrictions Community Activity    Stability/Clinical Decision Making Evolving/Moderate complexity    Rehab Potential Good    PT Frequency 2x / week    PT Treatment/Interventions ADLs/Self Care Home Management;Aquatic Therapy;Cryotherapy;Electrical Stimulation;Iontophoresis 79m/ml Dexamethasone;Moist Heat;Traction;Ultrasound;Gait training;Stair training;Functional mobility training;Therapeutic activities;Therapeutic exercise;Balance training;Neuromuscular re-education;Patient/family education;Manual techniques;Passive range of motion;Dry needling;Taping;Vasopneumatic Device;Spinal Manipulations;Joint Manipulations     PT Next Visit Plan assess and progress HEP, strengthening, core stability. Manual therapy/TPDN/stretching for lumbar paraspinals.             Patient will benefit from skilled therapeutic intervention in order to improve the following deficits and impairments:  Difficulty walking, Increased muscle spasms, Pain, Decreased balance, Impaired flexibility, Decreased strength, Postural dysfunction  Visit Diagnosis: Chronic low back pain, unspecified back pain laterality, unspecified whether sciatica present  Other muscle spasm  Difficulty in walking, not elsewhere classified  Muscle weakness (generalized)     Problem List Patient Active Problem List   Diagnosis Date Noted   (HFpEF) heart failure with preserved ejection fraction ([facility] 08/06/2021   Stenosis of right carotid artery 05/26/2021   Osteoporosis 05/26/2021   Constipation 050/87/1994  Alcoholic peripheral neuropathy ([facility] 02/08/2021   Seborrheic dermatitis, unspecified 02/19/2020   Unstable gait 10/20/2019   Dupuytren's contracture of right hand 09/29/2019   Chronic pain disorder 09/22/2019   Abnormal LFTs (liver function tests) 09/22/2019   Peripheral neuropathy 09/22/2019   History of syncope 09/21/2019   Benign essential hypertension 09/21/2019   Bilateral lower extremity edema 09/21/2019   Alcohol dependency ([place] 09/18/2018   Closed right trimalleolar fracture 09/18/2018   Depression 09/18/2018   Fall 09/18/2018   Syncope 09/18/2018   Nasal bones, closed fracture 08/07/2018   Hyperlipemia, mixed 04/04/2016   Low grade squamous intraepithelial lesion on cytologic smear of cervix (LGSIL) 04/04/2016    RScot Jun PTA 11/09/2021, 1:42 PM  [place] [address]Phone: [phone]  Fax:  [phone] Name: PTailynn ArmettaMRN: [id number] of Birth: 716-Mar-1947

## 2021-11-09 NOTE — Telephone Encounter (Signed)
Patient called because her and Dr.Jordan had previously discussed her going off prednisone and gabapentin at the same time. Patient states that she is just going to stop the prednisone instead. She wants to have another discussion with her pain doctor before stopping gabapentin and she does not want any effects from stopping them both at the same time    FYI patient just wanted this noted

## 2021-11-10 NOTE — Telephone Encounter (Signed)
fyi

## 2021-11-14 ENCOUNTER — Ambulatory Visit: Payer: Medicare PPO | Attending: Family Medicine | Admitting: Physical Therapy

## 2021-11-14 ENCOUNTER — Other Ambulatory Visit: Payer: Self-pay | Admitting: Family Medicine

## 2021-11-14 ENCOUNTER — Other Ambulatory Visit: Payer: Self-pay

## 2021-11-14 DIAGNOSIS — M62838 Other muscle spasm: Secondary | ICD-10-CM | POA: Insufficient documentation

## 2021-11-14 DIAGNOSIS — M545 Low back pain, unspecified: Secondary | ICD-10-CM | POA: Insufficient documentation

## 2021-11-14 DIAGNOSIS — R600 Localized edema: Secondary | ICD-10-CM

## 2021-11-14 DIAGNOSIS — M6281 Muscle weakness (generalized): Secondary | ICD-10-CM | POA: Diagnosis not present

## 2021-11-14 DIAGNOSIS — R262 Difficulty in walking, not elsewhere classified: Secondary | ICD-10-CM | POA: Diagnosis not present

## 2021-11-14 DIAGNOSIS — G8929 Other chronic pain: Secondary | ICD-10-CM | POA: Diagnosis not present

## 2021-11-14 NOTE — Therapy (Signed)
[place]. [place], [place], [address] Phone: [phone]   Fax:  [phone]  Physical Therapy Treatment  Patient Details  Name: Rebecca Baird MRN: [id number] Date of Birth: 1946/09/10 Referring Provider (PT): Dr Rebecca Baird   Encounter Date: 11/14/2021   PT End of Session - 11/14/21 1356     Visit Number 8    Date for PT Re-Evaluation 12/08/21    Authorization Type [employer]    PT Start Time 1315    PT Stop Time 1400    PT Time Calculation (min) 45 min             Past Medical History:  Diagnosis Date   Anxiety    Depression    GERD (gastroesophageal reflux disease)    Hypertension    Peripheral neuropathy     Past Surgical History:  Procedure Laterality Date   broken ankle     cataract surgery     NOSE SURGERY      There were no vitals filed for this visit.   Subjective Assessment - 11/14/21 1308     Subjective okay other than hands hurting alot, just took extra pill. ( pt holds alot of tension in her shlds)    Currently in Pain? Yes                               [facility] Adult PT Treatment/Exercise - 11/14/21 0001       High Level Balance   High Level Balance Activities Backward walking;Side stepping;Marching forwards   2 HHA then single HHA 2.5# ankle wts . 20 feet 2 x each direction     Lumbar Exercises: Aerobic   Nustep L5 x 6 min      Lumbar Exercises: Standing   Other Standing Lumbar Exercises 2# shld shrugs, backward and ext 12 each      Lumbar Exercises: Seated   Other Seated Lumbar Exercises pball press down 2x10x3"      Knee/Hip Exercises: Standing   Hip Flexion Stengthening;Left;15 reps;Knee straight   alt LE HHA 2.5#   Hip Abduction Stengthening;Both;20 reps;Knee straight   HHA 2.5# ALT   Hip Extension Stengthening;Both;20 reps;Knee straight   HHA ALT 2.5#   Forward Step Up Both;10 reps;Hand Hold: 2;Step Height: 2"   foam mat 2.5#    Forward Step Up Limitations ankle instability    Step Down Both;10 reps;Hand Hold: 2   foam mat 2.5 #   Step Down Limitations ankle instability    Walking with Sports Cord 20lb 4 way x 3 each      Knee/Hip Exercises: Seated   Long Arc Quad Strengthening;Both;2 sets;10 reps;Weights   3 sec TKE hold   Long Arc Quad Limitations 2.5#                       PT Short Term Goals - 11/01/21 1304       PT SHORT TERM GOAL #1   Title Pt will be independent with initial HEP.    Status Achieved               PT Long Term Goals - 11/01/21 1304       PT LONG TERM GOAL #1   Title Pt will be independent with advanced HEP.    Status Partially Met      PT LONG TERM GOAL #2  Title Pt will increase FOTO to at least 52% to allow her decreased pain with functional activities.    Status On-going      PT LONG TERM GOAL #3   Title Pt will report no increased pain with ambulation or with TMJ area with functional activities.    Status Partially Met      PT LONG TERM GOAL #4   Status On-going      PT LONG TERM GOAL #5   Title Pt will increase balance to allow her to ambulate outside with LRAD to allow her to return to walking program.                   Plan - 11/14/21 1357     Clinical Impression Statement performed and educ on ex to relax shlds ( rolls and shruggs for home). increased dynamic standing ex with wts and HHA with cuing needed for speed and control, ankle instability on foam mat. several LOB requiring assistance and rest breaks needed    PT Treatment/Interventions ADLs/Self Care Home Management;Aquatic Therapy;Cryotherapy;Electrical Stimulation;Iontophoresis 58m/ml Dexamethasone;Moist Heat;Traction;Ultrasound;Gait training;Stair training;Functional mobility training;Therapeutic activities;Therapeutic exercise;Balance training;Neuromuscular re-education;Patient/family education;Manual techniques;Passive range of motion;Dry needling;Taping;Vasopneumatic  Device;Spinal Manipulations;Joint Manipulations    PT Next Visit Plan assess and progress HEP, check goals             Patient will benefit from skilled therapeutic intervention in order to improve the following deficits and impairments:  Difficulty walking, Increased muscle spasms, Pain, Decreased balance, Impaired flexibility, Decreased strength, Postural dysfunction  Visit Diagnosis: Chronic low back pain, unspecified back pain laterality, unspecified whether sciatica present  Other muscle spasm  Difficulty in walking, not elsewhere classified  Muscle weakness (generalized)     Problem List Patient Active Problem List   Diagnosis Date Noted   (HFpEF) heart failure with preserved ejection fraction ([facility] 08/06/2021   Stenosis of right carotid artery 05/26/2021   Osteoporosis 05/26/2021   Constipation 055/83/1674  Alcoholic peripheral neuropathy (H[place] 02/08/2021   Seborrheic dermatitis, unspecified 02/19/2020   Unstable gait 10/20/2019   Dupuytren's contracture of right hand 09/29/2019   Chronic pain disorder 09/22/2019   Abnormal LFTs (liver function tests) 09/22/2019   Peripheral neuropathy 09/22/2019   History of syncope 09/21/2019   Benign essential hypertension 09/21/2019   Bilateral lower extremity edema 09/21/2019   Alcohol dependency (HA[facility] 09/18/2018   Closed right trimalleolar fracture 09/18/2018   Depression 09/18/2018   Fall 09/18/2018   Syncope 09/18/2018   Nasal bones, closed fracture 08/07/2018   Hyperlipemia, mixed 04/04/2016   Low grade squamous intraepithelial lesion on cytologic smear of cervix (LGSIL) 04/04/2016    Rebecca Baird,ANGIE, PTA 11/14/2021, 1:59 PM  [place] [address]Phone: [phone]  Fax:  [phone] Name: PTziporah KnokeMRN: [id number] of Birth: [birth year]

## 2021-11-16 ENCOUNTER — Encounter: Payer: Self-pay | Admitting: Physical Therapy

## 2021-11-16 ENCOUNTER — Other Ambulatory Visit: Payer: Self-pay

## 2021-11-16 ENCOUNTER — Ambulatory Visit: Payer: Medicare PPO | Admitting: Physical Therapy

## 2021-11-16 DIAGNOSIS — M6281 Muscle weakness (generalized): Secondary | ICD-10-CM

## 2021-11-16 DIAGNOSIS — G8929 Other chronic pain: Secondary | ICD-10-CM

## 2021-11-16 DIAGNOSIS — M545 Low back pain, unspecified: Secondary | ICD-10-CM | POA: Diagnosis not present

## 2021-11-16 DIAGNOSIS — M62838 Other muscle spasm: Secondary | ICD-10-CM

## 2021-11-16 DIAGNOSIS — R262 Difficulty in walking, not elsewhere classified: Secondary | ICD-10-CM | POA: Diagnosis not present

## 2021-11-16 NOTE — Therapy (Signed)
[place]. [place], [place], [address] Phone: [phone]   Fax:  [phone]  Physical Therapy Treatment  Patient Details  Name: Rebecca Baird MRN: [id number] Date of Birth: 24-Oct-1946 Referring Provider (PT): Dr Betty Martinique   Encounter Date: 11/16/2021   PT End of Session - 11/16/21 1340     Visit Number 9    Date for PT Re-Evaluation 12/08/21    PT Start Time 3300    PT Stop Time 1343    PT Time Calculation (min) 47 min    Activity Tolerance Patient tolerated treatment well    Behavior During Therapy [facility] for tasks assessed/performed             Past Medical History:  Diagnosis Date   Anxiety    Depression    GERD (gastroesophageal reflux disease)    Hypertension    Peripheral neuropathy     Past Surgical History:  Procedure Laterality Date   broken ankle     cataract surgery     NOSE SURGERY      There were no vitals filed for this visit.   Subjective Assessment - 11/16/21 1300     Subjective "My ands hurt more this morning than they ever have before" Much better now    Currently in Pain? Yes    Pain Score 6     Pain Location Hand    Pain Orientation Right;Left                               [facility] Adult PT Treatment/Exercise - 11/16/21 0001       High Level Balance   High Level Balance Activities Backward walking;Side stepping      Lumbar Exercises: Aerobic   Nustep L5 x 6 min      Lumbar Exercises: Machines for Strengthening   Cybex Knee Extension 10lb 3x5    Cybex Knee Flexion 25lb 2x12      Lumbar Exercises: Standing   Shoulder Extension Strengthening;Both;20 reps;Theraband    Theraband Level (Shoulder Extension) Level 3 (Green)      Lumbar Exercises: Seated   Other Seated Lumbar Exercises yellow ball OHP 2x10      Knee/Hip Exercises: Standing   Forward Step Up Both;1 set;10 reps;Hand Hold: 1;Step Height: 6"    Other Standing Knee Exercises  Alt 4in box taps 2x5 each      Knee/Hip Exercises: Seated   Sit to Sand 2 sets;10 reps;without UE support                       PT Short Term Goals - 11/01/21 1304       PT SHORT TERM GOAL #1   Title Pt will be independent with initial HEP.    Status Achieved               PT Long Term Goals - 11/16/21 1342       PT LONG TERM GOAL #1   Title Pt will be independent with advanced HEP.    Status Achieved      PT LONG TERM GOAL #2   Title Pt will increase FOTO to at least 52% to allow her decreased pain with functional activities.    Status On-going      PT LONG TERM GOAL #3   Title Pt will report no increased pain with ambulation or with TMJ area  with functional activities.    Status Partially Met      PT LONG TERM GOAL #4   Title Pt will increase BLE strength to at least 4+/5 to allow her to perform functional activities with improved ease and less assistance of husband.    Status On-going      PT LONG TERM GOAL #5   Title Pt will increase balance to allow her to ambulate outside with LRAD to allow her to return to walking program.    Status Partially Met                   Plan - 11/16/21 1340     Clinical Impression Statement Pt enters clinic without [facility] stated she felt comfortable here without it. She completed all interventions well. Some bilateral knee valgus with sit to stands. om LOB with alt box taps requiring min assist to correct. Increase reps and or resistance tolerated with seated leg curls and extensions.    Personal Factors and Comorbidities Comorbidity 3+    Comorbidities HTN,constipation, chronic pain, PAD, Incontinance of bladder with nocturia.    Examination-Activity Limitations Locomotion Level;Squat;Stairs;Stand    Examination-Participation Restrictions Community Activity    Stability/Clinical Decision Making Evolving/Moderate complexity    Rehab Potential Good    PT Frequency 2x / week    PT Duration 2 weeks    PT  Treatment/Interventions ADLs/Self Care Home Management;Aquatic Therapy;Cryotherapy;Electrical Stimulation;Iontophoresis 93m/ml Dexamethasone;Moist Heat;Traction;Ultrasound;Gait training;Stair training;Functional mobility training;Therapeutic activities;Therapeutic exercise;Balance training;Neuromuscular re-education;Patient/family education;Manual techniques;Passive range of motion;Dry needling;Taping;Vasopneumatic Device;Spinal Manipulations;Joint Manipulations    PT Next Visit Plan balance and postural strenght             Patient will benefit from skilled therapeutic intervention in order to improve the following deficits and impairments:  Difficulty walking, Increased muscle spasms, Pain, Decreased balance, Impaired flexibility, Decreased strength, Postural dysfunction  Visit Diagnosis: Chronic low back pain, unspecified back pain laterality, unspecified whether sciatica present  Other muscle spasm  Difficulty in walking, not elsewhere classified  Muscle weakness (generalized)     Problem List Patient Active Problem List   Diagnosis Date Noted   (HFpEF) heart failure with preserved ejection fraction (HEgan 08/06/2021   Stenosis of right carotid artery 05/26/2021   Osteoporosis 05/26/2021   Constipation 062/83/1517  Alcoholic peripheral neuropathy (HAshe[place] 02/08/2021   Seborrheic dermatitis, unspecified 02/19/2020   Unstable gait 10/20/2019   Dupuytren's contracture of right hand 09/29/2019   Chronic pain disorder 09/22/2019   Abnormal LFTs (liver function tests) 09/22/2019   Peripheral neuropathy 09/22/2019   History of syncope 09/21/2019   Benign essential hypertension 09/21/2019   Bilateral lower extremity edema 09/21/2019   Alcohol dependency (HC[place] 09/18/2018   Closed right trimalleolar fracture 09/18/2018   Depression 09/18/2018   Fall 09/18/2018   Syncope 09/18/2018   Nasal bones, closed fracture 08/07/2018   Hyperlipemia, mixed 04/04/2016   Low grade squamous  intraepithelial lesion on cytologic smear of cervix (LGSIL) 04/04/2016    RScot Jun PTA 11/16/2021, 1:43 PM  [place] [address]Phone: [phone]  Fax:  [phone] Name: Rebecca HeatheringtonMRN: [id number] of Birth: [birth year]

## 2021-11-20 ENCOUNTER — Other Ambulatory Visit: Payer: Self-pay

## 2021-11-20 ENCOUNTER — Ambulatory Visit: Payer: Medicare PPO | Admitting: Physical Therapy

## 2021-11-20 ENCOUNTER — Encounter: Payer: Self-pay | Admitting: Physical Therapy

## 2021-11-20 DIAGNOSIS — M62838 Other muscle spasm: Secondary | ICD-10-CM | POA: Diagnosis not present

## 2021-11-20 DIAGNOSIS — G8929 Other chronic pain: Secondary | ICD-10-CM

## 2021-11-20 DIAGNOSIS — M545 Low back pain, unspecified: Secondary | ICD-10-CM

## 2021-11-20 DIAGNOSIS — M6281 Muscle weakness (generalized): Secondary | ICD-10-CM | POA: Diagnosis not present

## 2021-11-20 DIAGNOSIS — R262 Difficulty in walking, not elsewhere classified: Secondary | ICD-10-CM

## 2021-11-20 NOTE — Therapy (Signed)
[place]. [place], [place], [address] Phone: [phone]   Fax:  [phone] Progress Note Reporting Period 10/17/21 to 11/20/21 for the first 10 visits  See note below for Objective Data and Assessment of Progress/Goals.     Physical Therapy Treatment  Patient Details  Name: Rebecca Baird MRN: [id number] Date of Birth: 15-Sep-1946 Referring Provider (PT): Dr Betty Martinique   Encounter Date: 11/20/2021   PT End of Session - 11/20/21 1422     Visit Number 10    Date for PT Re-Evaluation 12/08/21    Authorization Type [employer]    PT Start Time 1345    PT Stop Time 1425    PT Time Calculation (min) 40 min    Activity Tolerance Patient tolerated treatment well    Behavior During Therapy [facility] for tasks assessed/performed             Past Medical History:  Diagnosis Date   Anxiety    Depression    GERD (gastroesophageal reflux disease)    Hypertension    Peripheral neuropathy     Past Surgical History:  Procedure Laterality Date   broken ankle     cataract surgery     NOSE SURGERY      There were no vitals filed for this visit.   Subjective Assessment - 11/20/21 1345     Subjective Pt reports a new pain in her mid back that effects her breathing some at times. pain has been going on for two days, no know reason    Currently in Pain? Yes    Pain Score 6     Pain Location --   hands, feet, neck, and jaw               [facility] PT Assessment - 11/20/21 0001       Transfers   Five time sit to stand comments  19.8 sec with UE use                           [facility] Adult PT Treatment/Exercise - 11/20/21 0001       Lumbar Exercises: Aerobic   Nustep L5 x 6 min      Lumbar Exercises: Machines for Strengthening   Cybex Knee Flexion 25lb 2x12    Other Lumbar Machine Exercise rows 15lb 2x10    Other Lumbar Machine Exercise lats 15lb 2x10      Lumbar Exercises: Seated    Other Seated Lumbar Exercises yellow ball OHP 2x10      Knee/Hip Exercises: Standing   Forward Step Up Both;1 set;10 reps;Hand Hold: 1;Step Height: 6"      Knee/Hip Exercises: Seated   Sit to Sand without UE support;3 sets;5 reps   on airex                      PT Short Term Goals - 11/20/21 1423       PT SHORT TERM GOAL #1   Title Pt will be independent with initial HEP.    Status Achieved               PT Long Term Goals - 11/20/21 1356       PT LONG TERM GOAL #3   Title Pt will report no increased pain with ambulation or with TMJ area with functional activities.    Status Partially Met  PT LONG TERM GOAL #4   Title Pt will increase BLE strength to at least 4+/5 to allow her to perform functional activities with improved ease and less assistance of husband.    Status On-going      PT LONG TERM GOAL #5   Title Pt will increase balance to allow her to ambulate outside with LRAD to allow her to return to walking program.    Status Partially Met                   Plan - 11/20/21 1424     Clinical Impression Statement Again pt enters clinic without [facility] reporting a new pain in her mid back in addition to her neurooptic pain. Mid back a pain was not present during today's session. She reports better confidence with her mobility, but reports difficulty reaching the lower shelved in her refrigerator. Sit to stands were challenging for pt. Pt has difficulty with anterior weight shift keeping LE from pushing against mat table.    Comorbidities HTN,constipation, chronic pain, PAD, Incontinance of bladder with nocturia.    Examination-Activity Limitations Locomotion Level;Squat;Stairs;Stand    Examination-Participation Restrictions Community Activity    Rehab Potential Good    PT Frequency 2x / week    PT Treatment/Interventions ADLs/Self Care Home Management;Aquatic Therapy;Cryotherapy;Electrical Stimulation;Iontophoresis 58m/ml Dexamethasone;Moist  Heat;Traction;Ultrasound;Gait training;Stair training;Functional mobility training;Therapeutic activities;Therapeutic exercise;Balance training;Neuromuscular re-education;Patient/family education;Manual techniques;Passive range of motion;Dry needling;Taping;Vasopneumatic Device;Spinal Manipulations;Joint Manipulations    PT Next Visit Plan balance and postural strenght             Patient will benefit from skilled therapeutic intervention in order to improve the following deficits and impairments:  Difficulty walking, Increased muscle spasms, Pain, Decreased balance, Impaired flexibility, Decreased strength, Postural dysfunction  Visit Diagnosis: Difficulty in walking, not elsewhere classified  Chronic low back pain, unspecified back pain laterality, unspecified whether sciatica present  Muscle weakness (generalized)  Other muscle spasm     Problem List Patient Active Problem List   Diagnosis Date Noted   (HFpEF) heart failure with preserved ejection fraction (HCritt[place] 08/06/2021   Stenosis of right carotid artery 05/26/2021   Osteoporosis 05/26/2021   Constipation 055/21/7471  Alcoholic peripheral neuropathy (HBarnes[place] 02/08/2021   Seborrheic dermatitis, unspecified 02/19/2020   Unstable gait 10/20/2019   Dupuytren's contracture of right hand 09/29/2019   Chronic pain disorder 09/22/2019   Abnormal LFTs (liver function tests) 09/22/2019   Peripheral neuropathy 09/22/2019   History of syncope 09/21/2019   Benign essential hypertension 09/21/2019   Bilateral lower extremity edema 09/21/2019   Alcohol dependency (HDil[place] 09/18/2018   Closed right trimalleolar fracture 09/18/2018   Depression 09/18/2018   Fall 09/18/2018   Syncope 09/18/2018   Nasal bones, closed fracture 08/07/2018   Hyperlipemia, mixed 04/04/2016   Low grade squamous intraepithelial lesion on cytologic smear of cervix (LGSIL) 04/04/2016    RScot Jun PTA 11/20/2021, 2:27 PM  [place] [address]Phone: [phone]  Fax:  [phone] Name: PMiral HoopesMRN: [id number] of Birth: 731-Oct-1947

## 2021-11-21 ENCOUNTER — Ambulatory Visit: Payer: Medicare PPO | Admitting: Physical Therapy

## 2021-11-23 ENCOUNTER — Other Ambulatory Visit: Payer: Self-pay

## 2021-11-23 ENCOUNTER — Encounter: Payer: Self-pay | Admitting: Physical Therapy

## 2021-11-23 ENCOUNTER — Ambulatory Visit: Payer: Medicare PPO | Admitting: Physical Therapy

## 2021-11-23 DIAGNOSIS — G8929 Other chronic pain: Secondary | ICD-10-CM | POA: Diagnosis not present

## 2021-11-23 DIAGNOSIS — M545 Low back pain, unspecified: Secondary | ICD-10-CM

## 2021-11-23 DIAGNOSIS — M62838 Other muscle spasm: Secondary | ICD-10-CM | POA: Diagnosis not present

## 2021-11-23 DIAGNOSIS — R262 Difficulty in walking, not elsewhere classified: Secondary | ICD-10-CM

## 2021-11-23 DIAGNOSIS — M6281 Muscle weakness (generalized): Secondary | ICD-10-CM

## 2021-11-23 NOTE — Therapy (Signed)
[place] [place], [place], [address] Phone: [phone]   Fax:  [phone]  Physical Therapy Treatment  Patient Details  Name: Rebecca Baird MRN: [id number] Date of Birth: 08-Oct-1946 Referring Provider (PT): Dr Betty Martinique   Encounter Date: 11/23/2021   PT End of Session - 11/23/21 1342     Visit Number 11    Date for PT Re-Evaluation 12/08/21    Authorization Type [employer]    PT Start Time 1300    PT Stop Time 1343    PT Time Calculation (min) 43 min    Activity Tolerance Patient limited by fatigue    Behavior During Therapy [facility] for tasks assessed/performed             Past Medical History:  Diagnosis Date   Anxiety    Depression    GERD (gastroesophageal reflux disease)    Hypertension    Peripheral neuropathy     Past Surgical History:  Procedure Laterality Date   broken ankle     cataract surgery     NOSE SURGERY      There were no vitals filed for this visit.   Subjective Assessment - 11/23/21 1258     Subjective feeling a little wobbly    Currently in Pain? Yes    Pain Score 3     Pain Location Jaw   and hands                              [facility] Adult PT Treatment/Exercise - 11/23/21 0001       Ambulation/Gait   Stairs Yes    Stairs Assistance 5: Supervision    Stair Management Technique Two rails;Alternating pattern    Number of Stairs 25    Height of Stairs 6   & 4     High Level Balance   High Level Balance Activities Side stepping   on balance beam, in parallel bars     Lumbar Exercises: Aerobic   Nustep L5 x 5 min      Lumbar Exercises: Machines for Strengthening   Cybex Knee Extension 5lb 2x10    Cybex Knee Flexion 25lb 2x15      Lumbar Exercises: Standing   Shoulder Extension Strengthening;Both;20 reps;Theraband    Theraband Level (Shoulder Extension) Level 3 (Green)      Lumbar Exercises: Seated   Other Seated Lumbar  Exercises yellow ball OHP 2x10, Rows green 2x10      Knee/Hip Exercises: Standing   Other Standing Knee Exercises Alt 4in box taps 2x5 each                       PT Short Term Goals - 11/20/21 1423       PT SHORT TERM GOAL #1   Title Pt will be independent with initial HEP.    Status Achieved               PT Long Term Goals - 11/20/21 1356       PT LONG TERM GOAL #3   Title Pt will report no increased pain with ambulation or with TMJ area with functional activities.    Status Partially Met      PT LONG TERM GOAL #4   Title Pt will increase BLE strength to at least 4+/5 to allow her to perform functional activities with improved ease and less assistance  of husband.    Status On-going      PT LONG TERM GOAL #5   Title Pt will increase balance to allow her to ambulate outside with LRAD to allow her to return to walking program.    Status Partially Met                   Plan - 11/23/21 1343     Clinical Impression Statement Pt did fair today's in therapy. She reports not eating all day, feeling tired, and wobbly. LE weakness present with sit to stands today, pt had a difficult time not letting Le push against mat table. Rest needed during sets of HS curls due to fatigue. CGA needed for pt to complete alt box taps due to instability. No issues with stair negotiation.    Personal Factors and Comorbidities Comorbidity 3+    Comorbidities HTN,constipation, chronic pain, PAD, Incontinance of bladder with nocturia.    Examination-Activity Limitations Locomotion Level;Squat;Stairs;Stand    Stability/Clinical Decision Making Evolving/Moderate complexity    Rehab Potential Good    PT Frequency 2x / week    PT Treatment/Interventions ADLs/Self Care Home Management;Aquatic Therapy;Cryotherapy;Electrical Stimulation;Iontophoresis 52m/ml Dexamethasone;Moist Heat;Traction;Ultrasound;Gait training;Stair training;Functional mobility training;Therapeutic  activities;Therapeutic exercise;Balance training;Neuromuscular re-education;Patient/family education;Manual techniques;Passive range of motion;Dry needling;Taping;Vasopneumatic Device;Spinal Manipulations;Joint Manipulations    PT Next Visit Plan balance and postural strenght             Patient will benefit from skilled therapeutic intervention in order to improve the following deficits and impairments:  Difficulty walking, Increased muscle spasms, Pain, Decreased balance, Impaired flexibility, Decreased strength, Postural dysfunction  Visit Diagnosis: Difficulty in walking, not elsewhere classified  Muscle weakness (generalized)  Chronic low back pain, unspecified back pain laterality, unspecified whether sciatica present     Problem List Patient Active Problem List   Diagnosis Date Noted   (HFpEF) heart failure with preserved ejection fraction ([place] 08/06/2021   Stenosis of right carotid artery 05/26/2021   Osteoporosis 05/26/2021   Constipation 016/08/9603  Alcoholic peripheral neuropathy ([place] 02/08/2021   Seborrheic dermatitis, unspecified 02/19/2020   Unstable gait 10/20/2019   Dupuytren's contracture of right hand 09/29/2019   Chronic pain disorder 09/22/2019   Abnormal LFTs (liver function tests) 09/22/2019   Peripheral neuropathy 09/22/2019   History of syncope 09/21/2019   Benign essential hypertension 09/21/2019   Bilateral lower extremity edema 09/21/2019   Alcohol dependency ([place] 09/18/2018   Closed right trimalleolar fracture 09/18/2018   Depression 09/18/2018   Fall 09/18/2018   Syncope 09/18/2018   Nasal bones, closed fracture 08/07/2018   Hyperlipemia, mixed 04/04/2016   Low grade squamous intraepithelial lesion on cytologic smear of cervix (LGSIL) 04/04/2016    RScot Jun PTA 11/23/2021, 1:46 PM  [place] [address]Phone: [phone]  Fax:   [phone] Name: PCrissie AloiMRN: [id number] of Birth: [birth year]

## 2021-11-27 ENCOUNTER — Ambulatory Visit (INDEPENDENT_AMBULATORY_CARE_PROVIDER_SITE_OTHER): Payer: Medicare PPO

## 2021-11-27 VITALS — Ht 65.0 in | Wt 189.0 lb

## 2021-11-27 DIAGNOSIS — Z0000 Encounter for general adult medical examination without abnormal findings: Secondary | ICD-10-CM

## 2021-11-27 NOTE — Progress Notes (Signed)
I connected with Rebecca Baird today by telephone and verified that I am speaking with the correct person using two identifiers. Location patient: home Location provider: work Persons participating in the virtual visit: Younique, My LPN.   I discussed the limitations, risks, security and privacy concerns of performing an evaluation and management service by telephone and the availability of in person appointments. I also discussed with the patient that there may be a patient responsible charge related to this service. The patient expressed understanding and verbally consented to this telephonic visit.    Interactive audio and video telecommunications were attempted between this provider and patient, however failed, due to patient having technical difficulties OR patient did not have access to video capability.  We continued and completed visit with audio only.     Vital signs may be patient reported or missing.  Subjective:   Rebecca Baird is a 76 y.o. female who presents for Medicare Annual (Subsequent) preventive examination.  Review of Systems     Cardiac Risk Factors include: advanced age (>41men, >24 women);dyslipidemia;hypertension;obesity (BMI >30kg/m2)     Objective:    Today's Vitals   11/27/21 1111 11/27/21 1112  Weight: 189 lb (85.7 kg)   Height: 5\' 5"  (1.651 m)   PainSc:  4    Body mass index is 31.45 kg/m.  Advanced Directives 11/27/2021 10/17/2021 12/14/2019  Does Patient Have a Medical Advance Directive? No No No  Would patient like information on creating a medical advance directive? - No - Patient declined -    Current Medications (verified) Outpatient Encounter Medications as of 11/27/2021  Medication Sig   albuterol (VENTOLIN HFA) 108 (90 Base) MCG/ACT inhaler Inhale 2 puffs into the lungs every 6 (six) hours as needed for wheezing or shortness of breath.   alendronate (FOSAMAX) 70 MG tablet Take 1 tablet (70 mg total) by mouth  every 7 (seven) days. Take with a full glass of water on an empty stomach.   Ascorbic Acid (VITAMIN C) 1000 MG tablet Take 1,000 mg by mouth daily.   aspirin EC 81 MG tablet Take 81 mg by mouth daily. Swallow whole.   Cholecalciferol (VITAMIN D3) 125 MCG (5000 UT) CAPS Take 1 capsule by mouth daily.   Cyanocobalamin (VITAMIN B12 PO) Take 5,000 mg by mouth daily.   desonide (DESOWEN) 0.05 % cream APPLY TO FACE TOPICALLY DAILY AS NEEDED   docusate sodium (COLACE) 50 MG capsule Take 50 mg by mouth 2 (two) times daily.   fluticasone (FLONASE) 50 MCG/ACT nasal spray Place 2 sprays into both nostrils daily.   folic acid (FOLVITE) [id number] MCG tablet Take 400 mcg by mouth daily.   furosemide (LASIX) 20 MG tablet TAKE 1 TABLET BY MOUTH EVERY DAY   gabapentin (NEURONTIN) 300 MG capsule TAKE 1 CAPSULE BY MOUTH THREE TIMES A DAY   hydrochlorothiazide (HYDRODIURIL) 25 MG tablet TAKE 1 TABLET (25 MG TOTAL) BY MOUTH DAILY.   metoprolol succinate (TOPROL-XL) 25 MG 24 hr tablet TAKE 1 TABLET BY MOUTH EVERY DAY   omeprazole (PRILOSEC) 20 MG capsule Take 1 capsule (20 mg total) by mouth daily.   predniSONE (DELTASONE) 10 MG tablet TAKE 1 TABLET (10 MG TOTAL) BY MOUTH DAILY WITH BREAKFAST.   pyridOXINE (VITAMIN B-6) 100 MG tablet Take 100 mg by mouth daily.   rosuvastatin (CRESTOR) 10 MG tablet TAKE 1 TABLET BY MOUTH EVERY DAY   tapentadol (NUCYNTA) 100 MG 12 hr tablet Take 100 mg by mouth every 12 (twelve) hours.  thiamine (VITAMIN B-1) 100 MG tablet Take 100 mg by mouth daily.   triamcinolone cream (KENALOG) 0.1 % Apply 1 application topically 2 (two) times daily as needed. Legs   valsartan (DIOVAN) 160 MG tablet TAKE 1 TABLET BY MOUTH EVERY DAY   No facility-administered encounter medications on file as of 11/27/2021.    Allergies (verified) Epinephrine   History: Past Medical History:  Diagnosis Date   Anxiety    Depression    GERD (gastroesophageal reflux disease)    Hypertension    Peripheral  neuropathy    Past Surgical History:  Procedure Laterality Date   broken ankle     cataract surgery     NOSE SURGERY     Family History  Problem Relation Age of Onset   Breast cancer Mother    Breast cancer Cousin    Social History   Socioeconomic History   Marital status: Married    Spouse name: Not on file   Number of children: 1   Years of education: Not on file   Highest education level: Associate degree: academic program  Occupational History   Occupation: retired  Tobacco Use   Smoking status: Former    Types: Cigarettes   Smokeless tobacco: Never  Vaping Use   Vaping Use: Never used  Substance and Sexual Activity   Alcohol use: Yes    Alcohol/week: 3.0 standard drinks    Types: 3 Glasses of wine per week   Drug use: Yes    Types: Other-see comments    Comment: nucytha   Sexual activity: Not on file  Other Topics Concern   Not on file  Social History Narrative   Right handed   One story home   Drinks occasional caffeine   Social Determinants of Health   Financial Resource Strain: Low Risk    Difficulty of Paying Living Expenses: Not hard at all  Food Insecurity: No Food Insecurity   Worried About Charity fundraiser in the Last Year: Never true   [place] in the Last Year: Never true  Transportation Needs: No Transportation Needs   Lack of Transportation (Medical): No   Lack of Transportation (Non-Medical): No  Physical Activity: Insufficiently Active   Days of Exercise per Week: 2 days   Minutes of Exercise per Session: 40 min  Stress: Stress Concern Present   Feeling of Stress : To some extent  Social Connections: Not on file    Tobacco Counseling Counseling given: Not Answered   Clinical Intake:  Pre-visit preparation completed: Yes  Pain : 0-10 Pain Score: 4  Pain Type: Chronic pain Pain Location:  (hands and jaw) Pain Descriptors / Indicators: Aching Pain Onset: More than a month ago Pain Frequency: Constant      Nutritional Status: BMI > 30  Obese Nutritional Risks: Nausea/ vomitting/ diarrhea (diarrhea due to laxative use) Diabetes: No  How often do you need to have someone help you when you read instructions, pamphlets, or other written materials from your doctor or pharmacy?: 1 - Never What is the last grade level you completed in school?: masters degree  Diabetic? no  Interpreter Needed?: No  Information entered by :: NAllen LPN   Activities of Daily Living In your present state of health, do you have any difficulty performing the following activities: 11/27/2021  Hearing? N  Vision? N  Difficulty concentrating or making decisions? Y  Walking or climbing stairs? Y  Dressing or bathing? Y  Doing errands, shopping? Darreld Mclean  Preparing Food and eating ? Y  Comment husband cooks  Using the Toilet? N  In the past six months, have you accidently leaked urine? Y  Do you have problems with loss of bowel control? N  Managing your Medications? N  Managing your Finances? N  Housekeeping or managing your Housekeeping? Y  Some recent data might be hidden    Patient Care Team: Martinique, Betty G, MD as PCP - General (Family Medicine) Alda Berthold, DO as Consulting Physician (Neurology)  Indicate any recent Medical Services you may have received from other than [facility] providers in the past year (date may be approximate).     Assessment:   This is a routine wellness examination for Jakisha.  Hearing/Vision screen Vision Screening - Comments:: Regular eye exams, [facility]  Dietary issues and exercise activities discussed: Current Exercise Habits: Home exercise routine, Type of exercise: Other - see comments (physical therapy), Time (Minutes): 45, Frequency (Times/Week): 2, Weekly Exercise (Minutes/Week): 90   Goals Addressed             This Visit's Progress    Patient Stated       11/27/2021, maintain medications and figure out what is happening with back       Depression  Screen PHQ 2/9 Scores 11/27/2021 11/08/2021 10/09/2021 11/17/2020 10/20/2019  PHQ - 2 Score 0 0 2 2 0  PHQ- 9 Score - - 7 7 -    Fall Risk Fall Risk  11/27/2021 11/08/2021 10/09/2021 11/17/2020 12/14/2019  Falls in the past year? 0 1 1 0 0  Number falls in past yr: - 1 1 0 0  Injury with Fall? - 0 0 0 0  Risk for fall due to : Impaired balance/gait;Impaired mobility;Medication side effect Impaired mobility;Impaired balance/gait;History of fall(s) - Impaired mobility -  Follow up Falls evaluation completed;Education provided;Falls prevention discussed Education provided - Falls evaluation completed;Falls prevention discussed -    FALL RISK PREVENTION PERTAINING TO THE HOME:  Any stairs in or around the home? No  If so, are there any without handrails? N/a Home free of loose throw rugs in walkways, pet beds, electrical cords, etc? Yes  Adequate lighting in your home to reduce risk of falls? Yes   ASSISTIVE DEVICES UTILIZED TO PREVENT FALLS:  Life alert? No  Use of a cane, walker or w/c? Yes  Grab bars in the bathroom? Yes  Shower chair or bench in shower? Yes  Elevated toilet seat or a handicapped toilet? Yes   TIMED UP AND GO:  Was the test performed? No .      Cognitive Function:     6CIT Screen 11/27/2021 11/17/2020  What Year? 0 points 0 points  What month? 0 points 0 points  What time? 0 points 0 points  Count back from 20 0 points 0 points  Months in reverse 0 points 0 points  Repeat phrase 2 points 0 points  Total Score 2 0    Immunizations Immunization History  Administered Date(s) Administered   Fluad Quad(high Dose 65+) 08/03/2020   Influenza, High Dose Seasonal PF 07/07/2019, 08/02/2021   Influenza,inj,Quad PF,6+ Mos 07/27/2018   PFIZER Comirnaty(Gray Top)Covid-19 Tri-Sucrose Vaccine 03/03/2021   PFIZER(Purple Top)SARS-COV-2 Vaccination 01/03/2020, 01/27/2020   Pfizer Covid-19 Vaccine Bivalent Booster 16yrs & up 08/18/2021   Pneumococcal Polysaccharide-23  12/18/2020   Tdap 12/14/2020   Zoster Recombinat (Shingrix) 12/05/2020, 03/11/2021    TDAP status: Up to date  Flu Vaccine status: Up to date  Pneumococcal vaccine status: Up to  date  Covid-19 vaccine status: Completed vaccines  Qualifies for Shingles Vaccine? Yes   Zostavax completed No   Shingrix Completed?: Yes  Screening Tests Health Maintenance  Topic Date Due   Hepatitis C Screening  Never done   Pneumonia Vaccine 61+ Years old (2 - PCV) 12/18/2021   COLONOSCOPY (Pts 45-68yrs Insurance coverage will need to be confirmed)  03/19/2026   INFLUENZA VACCINE  Completed   DEXA SCAN  Completed   COVID-19 Vaccine  Completed   Zoster Vaccines- Shingrix  Completed   HPV VACCINES  Aged Out   TETANUS/TDAP  Discontinued    Health Maintenance  Health Maintenance Due  Topic Date Due   Hepatitis C Screening  Never done   Pneumonia Vaccine 71+ Years old (2 - PCV) 12/18/2021    Colorectal cancer screening: No longer required.   Mammogram status: Completed 01/19/2021. Repeat every year  Bone Density status: Completed 04/21/2021.   Lung Cancer Screening: (Low Dose CT Chest recommended if Age 46-80 years, 30 pack-year currently smoking OR have quit w/in 15years.) does not qualify.   Lung Cancer Screening Referral: no  Additional Screening:  Hepatitis C Screening: does qualify;   Vision Screening: Recommended annual ophthalmology exams for early detection of glaucoma and other disorders of the eye. Is the patient up to date with their annual eye exam?  Yes  Who is the provider or what is the name of the office in which the patient attends annual eye exams? [facility] If pt is not established with a provider, would they like to be referred to a provider to establish care? No .   Dental Screening: Recommended annual dental exams for proper oral hygiene  Community Resource Referral / Chronic Care Management: CRR required this visit?  No   CCM required this visit?  No       Plan:     I have personally reviewed and noted the following in the patients chart:   Medical and social history Use of alcohol, tobacco or illicit drugs  Current medications and supplements including opioid prescriptions.  Functional ability and status Nutritional status Physical activity Advanced directives List of other physicians Hospitalizations, surgeries, and ER visits in previous 12 months Vitals Screenings to include cognitive, depression, and falls Referrals and appointments  In addition, I have reviewed and discussed with patient certain preventive protocols, quality metrics, and best practice recommendations. A written personalized care plan for preventive services as well as general preventive health recommendations were provided to patient.     Kellie Simmering, LPN   [id number]   Nurse Notes: none

## 2021-11-27 NOTE — Patient Instructions (Signed)
Rebecca Baird , Thank you for taking time to come for your Medicare Wellness Visit. I appreciate your ongoing commitment to your health goals. Please review the following plan we discussed and let me know if I can assist you in the future.   Screening recommendations/referrals: Colonoscopy: not required Mammogram: completed 01/19/2021 Bone Density: completed 04/21/2021 Recommended yearly ophthalmology/optometry visit for glaucoma screening and checkup Recommended yearly dental visit for hygiene and checkup  Vaccinations: Influenza vaccine: completed 08/02/2021 Pneumococcal vaccine: completed 12/18/2020 Tdap vaccine: completed 12/14/2020, due 12/14/2030 Shingles vaccine: completed    Covid-19: completed  Advanced directives: Advance directive discussed with you today.  Conditions/risks identified: none  Next appointment: Follow up in one year for your annual wellness visit    Preventive Care 65 Years and Older, Female Preventive care refers to lifestyle choices and visits with your health care provider that can promote health and wellness. What does preventive care include? A yearly physical exam. This is also called an annual well check. Dental exams once or twice a year. Routine eye exams. Ask your health care provider how often you should have your eyes checked. Personal lifestyle choices, including: Daily care of your teeth and gums. Regular physical activity. Eating a healthy diet. Avoiding tobacco and drug use. Limiting alcohol use. Practicing safe sex. Taking low-dose aspirin every day. Taking vitamin and mineral supplements as recommended by your health care provider. What happens during an annual well check? The services and screenings done by your health care provider during your annual well check will depend on your age, overall health, lifestyle risk factors, and family history of disease. Counseling  Your health care provider may ask you questions about your: Alcohol  use. Tobacco use. Drug use. Emotional well-being. Home and relationship well-being. Sexual activity. Eating habits. History of falls. Memory and ability to understand (cognition). Work and work Statistician. Reproductive health. Screening  You may have the following tests or measurements: Height, weight, and BMI. Blood pressure. Lipid and cholesterol levels. These may be checked every 5 years, or more frequently if you are over 57 years old. Skin check. Lung cancer screening. You may have this screening every year starting at age 42 if you have a 30-pack-year history of smoking and currently smoke or have quit within the past 15 years. Fecal occult blood test (FOBT) of the stool. You may have this test every year starting at age 67. Flexible sigmoidoscopy or colonoscopy. You may have a sigmoidoscopy every 5 years or a colonoscopy every 10 years starting at age 59. Hepatitis C blood test. Hepatitis B blood test. Sexually transmitted disease (STD) testing. Diabetes screening. This is done by checking your blood sugar (glucose) after you have not eaten for a while (fasting). You may have this done every 1-3 years. Bone density scan. This is done to screen for osteoporosis. You may have this done starting at age 62. Mammogram. This may be done every 1-2 years. Talk to your health care provider about how often you should have regular mammograms. Talk with your health care provider about your test results, treatment options, and if necessary, the need for more tests. Vaccines  Your health care provider may recommend certain vaccines, such as: Influenza vaccine. This is recommended every year. Tetanus, diphtheria, and acellular pertussis (Tdap, Td) vaccine. You may need a Td booster every 10 years. Zoster vaccine. You may need this after age 28. Pneumococcal 13-valent conjugate (PCV13) vaccine. One dose is recommended after age 71. Pneumococcal polysaccharide (PPSV23) vaccine. One dose is  recommended after age 17. Talk to your health care provider about which screenings and vaccines you need and how often you need them. This information is not intended to replace advice given to you by your health care provider. Make sure you discuss any questions you have with your health care provider. Document Released: 11/25/2015 Document Revised: 07/18/2016 Document Reviewed: 08/30/2015 Elsevier Interactive Patient Education  2017 [employer] Prevention in the Home Falls can cause injuries. They can happen to people of all ages. There are many things you can do to make your home safe and to help prevent falls. What can I do on the outside of my home? Regularly fix the edges of walkways and driveways and fix any cracks. Remove anything that might make you trip as you walk through a door, such as a raised step or threshold. Trim any bushes or trees on the path to your home. Use bright outdoor lighting. Clear any walking paths of anything that might make someone trip, such as rocks or tools. Regularly check to see if handrails are loose or broken. Make sure that both sides of any steps have handrails. Any raised decks and porches should have guardrails on the edges. Have any leaves, snow, or ice cleared regularly. Use sand or salt on walking paths during winter. Clean up any spills in your garage right away. This includes oil or grease spills. What can I do in the bathroom? Use night lights. Install grab bars by the toilet and in the tub and shower. Do not use towel bars as grab bars. Use non-skid mats or decals in the tub or shower. If you need to sit down in the shower, use a plastic, non-slip stool. Keep the floor dry. Clean up any water that spills on the floor as soon as it happens. Remove soap buildup in the tub or shower regularly. Attach bath mats securely with double-sided non-slip rug tape. Do not have throw rugs and other things on the floor that can make you  trip. What can I do in the bedroom? Use night lights. Make sure that you have a light by your bed that is easy to reach. Do not use any sheets or blankets that are too big for your bed. They should not hang down onto the floor. Have a firm chair that has side arms. You can use this for support while you get dressed. Do not have throw rugs and other things on the floor that can make you trip. What can I do in the kitchen? Clean up any spills right away. Avoid walking on wet floors. Keep items that you use a lot in easy-to-reach places. If you need to reach something above you, use a strong step stool that has a grab bar. Keep electrical cords out of the way. Do not use floor polish or wax that makes floors slippery. If you must use wax, use non-skid floor wax. Do not have throw rugs and other things on the floor that can make you trip. What can I do with my stairs? Do not leave any items on the stairs. Make sure that there are handrails on both sides of the stairs and use them. Fix handrails that are broken or loose. Make sure that handrails are as long as the stairways. Check any carpeting to make sure that it is firmly attached to the stairs. Fix any carpet that is loose or worn. Avoid having throw rugs at the top or bottom of the stairs. If you  do have throw rugs, attach them to the floor with carpet tape. Make sure that you have a light switch at the top of the stairs and the bottom of the stairs. If you do not have them, ask someone to add them for you. What else can I do to help prevent falls? Wear shoes that: Do not have high heels. Have rubber bottoms. Are comfortable and fit you well. Are closed at the toe. Do not wear sandals. If you use a stepladder: Make sure that it is fully opened. Do not climb a closed stepladder. Make sure that both sides of the stepladder are locked into place. Ask someone to hold it for you, if possible. Clearly mark and make sure that you can  see: Any grab bars or handrails. First and last steps. Where the edge of each step is. Use tools that help you move around (mobility aids) if they are needed. These include: Canes. Walkers. Scooters. Crutches. Turn on the lights when you go into a dark area. Replace any light bulbs as soon as they burn out. Set up your furniture so you have a clear path. Avoid moving your furniture around. If any of your floors are uneven, fix them. If there are any pets around you, be aware of where they are. Review your medicines with your doctor. Some medicines can make you feel dizzy. This can increase your chance of falling. Ask your doctor what other things that you can do to help prevent falls. This information is not intended to replace advice given to you by your health care provider. Make sure you discuss any questions you have with your health care provider. Document Released: 08/25/2009 Document Revised: 04/05/2016 Document Reviewed: 12/03/2014 Elsevier Interactive Patient Education  2017 [employer].

## 2021-11-28 ENCOUNTER — Encounter: Payer: Self-pay | Admitting: Physical Therapy

## 2021-11-28 ENCOUNTER — Ambulatory Visit: Payer: Medicare PPO | Admitting: Physical Therapy

## 2021-11-28 ENCOUNTER — Other Ambulatory Visit: Payer: Self-pay

## 2021-11-28 DIAGNOSIS — M6281 Muscle weakness (generalized): Secondary | ICD-10-CM | POA: Diagnosis not present

## 2021-11-28 DIAGNOSIS — M545 Low back pain, unspecified: Secondary | ICD-10-CM | POA: Diagnosis not present

## 2021-11-28 DIAGNOSIS — G8929 Other chronic pain: Secondary | ICD-10-CM

## 2021-11-28 DIAGNOSIS — M62838 Other muscle spasm: Secondary | ICD-10-CM

## 2021-11-28 DIAGNOSIS — R262 Difficulty in walking, not elsewhere classified: Secondary | ICD-10-CM

## 2021-11-28 NOTE — Therapy (Signed)
[place] [place], [place], [address] Phone: [phone]   Fax:  [phone]  Physical Therapy Treatment  Patient Details  Name: Rebecca Baird MRN: [id number] Date of Birth: 03-23-1946 Referring Provider (PT): Dr Betty Martinique   Encounter Date: 11/28/2021   PT End of Session - 11/28/21 1341     Visit Number 12    Date for PT Re-Evaluation 12/08/21    PT Start Time 1300    PT Stop Time 1344    PT Time Calculation (min) 44 min    Activity Tolerance Patient limited by fatigue    Behavior During Therapy [facility] for tasks assessed/performed             Past Medical History:  Diagnosis Date   Anxiety    Depression    GERD (gastroesophageal reflux disease)    Hypertension    Peripheral neuropathy     Past Surgical History:  Procedure Laterality Date   broken ankle     cataract surgery     NOSE SURGERY      There were no vitals filed for this visit.   Subjective Assessment - 11/28/21 1255     Subjective "no doing so well" "I feel so off" Things are more difficult to do walking, standing up, organizing thoughts. Pt think something neurological has happened "Mini stroke"    Pertinent History HTN, constipation, chronic pain, and PAD.    Currently in Pain? No/denies                               [facility] Adult PT Treatment/Exercise - 11/28/21 0001       Ambulation/Gait   Stairs Yes    Stairs Assistance 5: Supervision    Stair Management Technique Two rails;Alternating pattern    Number of Stairs 15    Height of Stairs 6   & 4   Gait Comments increase LE fatigue      High Level Balance   High Level Balance Activities Backward walking;Side stepping      Lumbar Exercises: Aerobic   Nustep L5 x 5 min      Lumbar Exercises: Seated   Other Seated Lumbar Exercises Seated rows green 2x10      Knee/Hip Exercises: Standing   Other Standing Knee Exercises Alt 4in box taps x10,  x6      Knee/Hip Exercises: Seated   Sit to Sand 2 sets;10 reps;without UE support                       PT Short Term Goals - 11/20/21 1423       PT SHORT TERM GOAL #1   Title Pt will be independent with initial HEP.    Status Achieved               PT Long Term Goals - 11/28/21 1314       PT LONG TERM GOAL #4   Title Pt will increase BLE strength to at least 4+/5 to allow her to perform functional activities with improved ease and less assistance of husband.    Status On-going      PT LONG TERM GOAL #5   Title Pt will increase balance to allow her to ambulate outside with LRAD to allow her to return to walking program.    Status Partially Met  Plan - 11/28/21 1342     Clinical Impression Statement Pt enters clinic reporting that she thinks something neurological has changed wit her. She has not contacted her MD about her condition. She was willing to partake in therapy but did have some increase fatigue. Instability noted with at four inch box taps. She did well with balance interventions. LE fatigue reported with stairs.    Comorbidities HTN,constipation, chronic pain, PAD, Incontinance of bladder with nocturia.    Examination-Activity Limitations Locomotion Level;Squat;Stairs;Stand    Examination-Participation Restrictions Community Activity    Rehab Potential Good    PT Frequency 2x / week    PT Duration 2 weeks    PT Treatment/Interventions ADLs/Self Care Home Management;Aquatic Therapy;Cryotherapy;Electrical Stimulation;Iontophoresis 31m/ml Dexamethasone;Moist Heat;Traction;Ultrasound;Gait training;Stair training;Functional mobility training;Therapeutic activities;Therapeutic exercise;Balance training;Neuromuscular re-education;Patient/family education;Manual techniques;Passive range of motion;Dry needling;Taping;Vasopneumatic Device;Spinal Manipulations;Joint Manipulations    PT Next Visit Plan balance and postural strenght              Patient will benefit from skilled therapeutic intervention in order to improve the following deficits and impairments:  Difficulty walking, Increased muscle spasms, Pain, Decreased balance, Impaired flexibility, Decreased strength, Postural dysfunction  Visit Diagnosis: Difficulty in walking, not elsewhere classified  Muscle weakness (generalized)  Chronic low back pain, unspecified back pain laterality, unspecified whether sciatica present  Other muscle spasm     Problem List Patient Active Problem List   Diagnosis Date Noted   (HFpEF) heart failure with preserved ejection fraction ([place] 08/06/2021   Stenosis of right carotid artery 05/26/2021   Osteoporosis 05/26/2021   Constipation 092/76/3943  Alcoholic peripheral neuropathy ([place] 02/08/2021   Seborrheic dermatitis, unspecified 02/19/2020   Unstable gait 10/20/2019   Dupuytren's contracture of right hand 09/29/2019   Chronic pain disorder 09/22/2019   Abnormal LFTs (liver function tests) 09/22/2019   Peripheral neuropathy 09/22/2019   History of syncope 09/21/2019   Benign essential hypertension 09/21/2019   Bilateral lower extremity edema 09/21/2019   Alcohol dependency ([place] 09/18/2018   Closed right trimalleolar fracture 09/18/2018   Depression 09/18/2018   Fall 09/18/2018   Syncope 09/18/2018   Nasal bones, closed fracture 08/07/2018   Hyperlipemia, mixed 04/04/2016   Low grade squamous intraepithelial lesion on cytologic smear of cervix (LGSIL) 04/04/2016    RScot Jun PTA 11/28/2021, 1:44 PM  [place] [address]Phone: [phone]  Fax:  [phone] Name: Rebecca LermanMRN: [id number] of Birth: 7Oct 26, 1947

## 2021-11-30 ENCOUNTER — Encounter: Payer: Self-pay | Admitting: Physical Therapy

## 2021-11-30 ENCOUNTER — Ambulatory Visit: Payer: Medicare PPO | Admitting: Physical Therapy

## 2021-12-19 ENCOUNTER — Other Ambulatory Visit: Payer: Self-pay | Admitting: Family Medicine

## 2022-01-05 ENCOUNTER — Other Ambulatory Visit: Payer: Self-pay | Admitting: Family Medicine

## 2022-01-05 DIAGNOSIS — G8929 Other chronic pain: Secondary | ICD-10-CM | POA: Diagnosis not present

## 2022-01-05 DIAGNOSIS — M5416 Radiculopathy, lumbar region: Secondary | ICD-10-CM | POA: Diagnosis not present

## 2022-01-05 DIAGNOSIS — L298 Other pruritus: Secondary | ICD-10-CM

## 2022-01-08 ENCOUNTER — Other Ambulatory Visit: Payer: Self-pay | Admitting: Family Medicine

## 2022-01-08 DIAGNOSIS — Z1231 Encounter for screening mammogram for malignant neoplasm of breast: Secondary | ICD-10-CM

## 2022-01-15 ENCOUNTER — Encounter: Payer: Self-pay | Admitting: Neurology

## 2022-01-15 ENCOUNTER — Other Ambulatory Visit: Payer: Self-pay

## 2022-01-15 ENCOUNTER — Ambulatory Visit: Payer: Medicare PPO | Admitting: Neurology

## 2022-01-15 VITALS — BP 85/51 | HR 103 | Ht 65.0 in | Wt 188.0 lb

## 2022-01-15 DIAGNOSIS — G621 Alcoholic polyneuropathy: Secondary | ICD-10-CM

## 2022-01-15 NOTE — Progress Notes (Signed)
? ? ?Follow-up Visit ? ? ?Date: 01/15/22 ? ? ?Lyda Kalata Sausedo ?MRN: [id number] ?DOB: 1946/03/05 ? ? ?Interim History: ?Katherleen Imamura is a 76 y.o. Caucasian female with hypertension, chronic pain, depression, alcohol dependency, and GERD returning to the clinic for follow-up of alcoholic neuropathy.  The patient was accompanied to the clinic by self. ? ?History of present illness: ?She has neuropathy in the feet which has been present for the past several years and started to involve the hands over the past year.  She had numbness as well as tingling and stabbing pain.  She was diagnosed at [facility] where she had NCS/EMG of the legs.   She has imbalance, dizziness, and falls. Her dizziness is more of unsteadiness and imbalance, she does not have spinning sensation.  She completed physical therapy.  She had no history of diabetes.  She enjoys wine and drinks ~3 glasses nightly for many years.  At some point, her provider has addressed her alcohol dependency because she is taking vitamin B1, 123456, and folic acid supplements.  ? ?She also complains of significant arthritic pain involving the hands and feet.  She has Dupuytren's contracture in the right hand and hammer toes bilaterally.  She was seeing Pain Management and takes Nucynta.  She has previously tried gabapentin and Lyrica which did not help.  She is hoping to establish care for pain management.  Patient was informed that our practice does not do chronic pain management.   ? ?UPDATE 01/15/2022:  She was last seen in 2021 for alcohol induced neuropathy.  Over the past two years, she has noticed increased tingling/numbness involving the fingers and up to her knees.  Symptoms are constant.  She has some weakness.  She uses a cane at all times for balance.  She has one fall last week and needed assistance to stand up.  No injuries.  ? ?She continues to drink alcohol 2-3 glasses of wine nightly.   ? ?She takes gabapentin 300mg  TID and Nucynta by  pain management.  ? ?Medications:  ?Current Outpatient Medications on File Prior to Visit  ?Medication Sig Dispense Refill  ? albuterol (VENTOLIN HFA) 108 (90 Base) MCG/ACT inhaler Inhale 2 puffs into the lungs every 6 (six) hours as needed for wheezing or shortness of breath. 8 g 0  ? alendronate (FOSAMAX) 70 MG tablet Take 1 tablet (70 mg total) by mouth every 7 (seven) days. Take with a full glass of water on an empty stomach. 13 tablet 2  ? Ascorbic Acid (VITAMIN C) 1000 MG tablet Take 1,000 mg by mouth daily.    ? aspirin EC 81 MG tablet Take 81 mg by mouth daily. Swallow whole.    ? Cholecalciferol (VITAMIN D3) 125 MCG (5000 UT) CAPS Take 1 capsule by mouth daily.    ? Cyanocobalamin (VITAMIN B12 PO) Take 5,000 mg by mouth daily.    ? desonide (DESOWEN) 0.05 % cream APPLY TO FACE TOPICALLY DAILY AS NEEDED 30 g 1  ? docusate sodium (COLACE) 50 MG capsule Take 50 mg by mouth 2 (two) times daily.    ? folic acid (FOLVITE) [id number] MCG tablet Take 400 mcg by mouth daily.    ? gabapentin (NEURONTIN) 300 MG capsule TAKE 1 CAPSULE BY MOUTH THREE TIMES A DAY 90 capsule 3  ? hydrochlorothiazide (HYDRODIURIL) 25 MG tablet TAKE 1 TABLET (25 MG TOTAL) BY MOUTH DAILY. 90 tablet 1  ? omeprazole (PRILOSEC) 20 MG capsule Take 1 capsule (20 mg total) by mouth  daily. 90 capsule 2  ? pyridOXINE (VITAMIN B-6) 100 MG tablet Take 100 mg by mouth daily.    ? tapentadol (NUCYNTA) 100 MG 12 hr tablet Take 100 mg by mouth every 12 (twelve) hours.    ? thiamine (VITAMIN B-1) 100 MG tablet Take 100 mg by mouth daily.    ? triamcinolone cream (KENALOG) 0.1 % APPLY TO LEGS TOPICALLY TWICE A DAY AS NEEDED 30 g 2  ? valsartan (DIOVAN) 160 MG tablet TAKE 1 TABLET BY MOUTH EVERY DAY 90 tablet 1  ? fluticasone (FLONASE) 50 MCG/ACT nasal spray SPRAY 2 SPRAYS INTO EACH NOSTRIL EVERY DAY (Patient not taking: Reported on 01/15/2022) 48 mL 1  ? furosemide (LASIX) 20 MG tablet TAKE 1 TABLET BY MOUTH EVERY DAY 90 tablet 2  ? metoprolol succinate (TOPROL-XL)  25 MG 24 hr tablet TAKE 1 TABLET BY MOUTH EVERY DAY 90 tablet 2  ? predniSONE (DELTASONE) 10 MG tablet TAKE 1 TABLET (10 MG TOTAL) BY MOUTH DAILY WITH BREAKFAST. (Patient not taking: Reported on 01/15/2022) 30 tablet 2  ? rosuvastatin (CRESTOR) 10 MG tablet TAKE 1 TABLET BY MOUTH EVERY DAY 90 tablet 2  ? ?No current facility-administered medications on file prior to visit.  ? ? ?Allergies:  ?Allergies  ?Allergen Reactions  ? Epinephrine Other (See Comments) and Palpitations  ?  "Heart Races Uncomfortably" ?  ? ? ?Vital Signs:  ?BP (!) 85/51 Comment: Patient states her pressure has been running low  Pulse (!) 103   Ht 5\' 5"  (1.651 m)   Wt 188 lb (85.3 kg)   SpO2 94%   BMI 31.28 kg/m?  ?  ?Neurological Exam: ?MENTAL STATUS including orientation to time, place, person, recent and remote memory, attention span and concentration, language, and fund of knowledge is normal.  Speech is not dysarthric. ? ?CRANIAL NERVES:  No visual field defects.  Pupils equal round and reactive to light.  Normal conjugate, extra-ocular eye movements in all directions of gaze.  No ptosis.  Face is symmetric. Palate elevates symmetrically.  Tongue is midline. ? ?MOTOR:  Motor strength is 5/5 in all extremities, except trace weakness distally with toe extension and flexion.  Right 5th finger Dupuytren's contracture. No atrophy, fasciculations or abnormal movements.  No pronator drift.  Tone is normal.   ? ?MSRs:  Reflexes are 2+/4 throughout, and absent at the ankles. ? ?SENSORY:  Vibration intact at the MCP, reduced at the knees and absent below the ankles.  Temperature reduced throughout.  Pin prick reduced below mid-calf bilaterally. Rhomberg testing positive. ? ?COORDINATION/GAIT:  Normal finger-to- nose-finger.  Intact rapid alternating movements bilaterally.  Gait wide-based, assisted with cane, stable. ? ? ?Data:n/a ? ?IMPRESSION/PLAN: ?Alcohol-induced neuropathy, progressively getting worse and involving a stocking-glove  distribution. Patient counseled that the natural course of neuropathy is that it will slowly get worse over time, especially if she does not reduce or abstain from alcohol. As it progresses, there is more paresthesias in the hands, distal weakness, and imbalance.  I offered NCS/EMG of the arms to evaluate her symptoms, but given that it would not change management, she opted not to proceed.  Encouraged patient to try to abstain from alcohol. ?She takes gabapentin 300mg  TID, patient informed that it does not heal the nerves and only masks pain.  Unfortunately, there is no medication effective to treat numbness. ?Patient educated on daily foot inspection, fall prevention, and safety precautions around the home. ?She has a callous at the base of her right great toe  and suggest that she follow-up with podiatry for this.  ? ?Total time spent reviewing records, interview, history/exam, documentation, and coordination of care on day of encounter:  30 min ? ? ? ?Thank you for allowing me to participate in patient's care.  If I can answer any additional questions, I would be pleased to do so.   ? ?Sincerely, ? ? ? ?Raysa Bosak K. Posey Pronto, DO ? ? ?

## 2022-01-23 ENCOUNTER — Ambulatory Visit: Payer: Medicare PPO | Admitting: Family Medicine

## 2022-01-23 ENCOUNTER — Encounter: Payer: Self-pay | Admitting: Family Medicine

## 2022-01-23 VITALS — BP 120/70 | HR 102 | Temp 97.8°F | Resp 16 | Ht 65.0 in

## 2022-01-23 DIAGNOSIS — N390 Urinary tract infection, site not specified: Secondary | ICD-10-CM | POA: Diagnosis not present

## 2022-01-23 DIAGNOSIS — R631 Polydipsia: Secondary | ICD-10-CM | POA: Diagnosis not present

## 2022-01-23 DIAGNOSIS — N179 Acute kidney failure, unspecified: Secondary | ICD-10-CM

## 2022-01-23 DIAGNOSIS — F4489 Other dissociative and conversion disorders: Secondary | ICD-10-CM

## 2022-01-23 DIAGNOSIS — R300 Dysuria: Secondary | ICD-10-CM | POA: Diagnosis not present

## 2022-01-23 LAB — POCT URINALYSIS DIPSTICK
Bilirubin, UA: NEGATIVE
Blood, UA: NEGATIVE
Glucose, UA: NEGATIVE
Ketones, UA: POSITIVE
Leukocytes, UA: NEGATIVE
Nitrite, UA: POSITIVE
Protein, UA: POSITIVE — AB
Spec Grav, UA: 1.01 (ref 1.010–1.025)
Urobilinogen, UA: 1 E.U./dL
pH, UA: 7 (ref 5.0–8.0)

## 2022-01-23 LAB — POCT GLYCOSYLATED HEMOGLOBIN (HGB A1C): HbA1c, POC (prediabetic range): 5.8 % (ref 5.7–6.4)

## 2022-01-23 MED ORDER — SULFAMETHOXAZOLE-TRIMETHOPRIM 800-160 MG PO TABS: 1.0000 | ORAL_TABLET | Freq: Two times a day (BID) | ORAL | 0 refills | Status: DC

## 2022-01-23 NOTE — Progress Notes (Signed)
? ? ?ACUTE VISIT ?Chief Complaint  ?Patient presents with  ? Dysuria  ?  Symptoms started on Friday, having some confusion as well, "feels out of it"   ? ?HPI: ?Rebecca Baird is a 76 y.o. female with hx of chronic pain, depression, unstable gait,HLD,alcohol dependency,and HTN here today with her husband, who is concerned about episodes of confusion, "forgetful" for the past 3 days. ?Decreased appetite, worsening fatigue, and urinary symptoms. ?Dysuria  ?This is a new problem. The current episode started in the past 7 days. The problem has been unchanged. The quality of the pain is described as burning. The pain is mild. There has been no fever. She is Not sexually active. There is No history of pyelonephritis. Associated symptoms include frequency and hesitancy. Pertinent negatives include no chills, discharge, flank pain, hematuria, nausea, sweats, urgency or vomiting. She has tried nothing for the symptoms.  ? ?Negative for headache, sore throat, CP,palpitations, cough, wheezing, SOB, or skin rash. ?No sick contact. ? ?According to her husband she is drinking 12 oz of water every hour. ?Not checking BP at home. ?She is on chronic opioid treatment, Tapentadol 100 mg bid and takes Gabapentin 300 mg tid for peripheral neuropathy. ? ?He has checked her meds and she seems to be taken all as instructed. ?Has not drunken alcohol in 3 days. ? ?Review of Systems  ?Constitutional:  Positive for activity change, appetite change and fatigue. Negative for chills, diaphoresis and fever.  ?HENT:  Negative for mouth sores and nosebleeds.   ?Cardiovascular:  Negative for leg swelling.  ?Gastrointestinal:  Negative for abdominal pain, nausea and vomiting.  ?     No changes in bowel habits.  ?Genitourinary:  Positive for dysuria, frequency and hesitancy. Negative for decreased urine volume, flank pain, hematuria and urgency.  ?Musculoskeletal:  Positive for arthralgias and gait problem.  ?Neurological:  Negative for  syncope and facial asymmetry.  ?Psychiatric/Behavioral:  Negative for hallucinations.   ?Rest see pertinent positives and negatives per HPI. ? ?Current Outpatient Medications on File Prior to Visit  ?Medication Sig Dispense Refill  ? albuterol (VENTOLIN HFA) 108 (90 Base) MCG/ACT inhaler Inhale 2 puffs into the lungs every 6 (six) hours as needed for wheezing or shortness of breath. 8 g 0  ? alendronate (FOSAMAX) 70 MG tablet Take 1 tablet (70 mg total) by mouth every 7 (seven) days. Take with a full glass of water on an empty stomach. 13 tablet 2  ? Ascorbic Acid (VITAMIN C) 1000 MG tablet Take 1,000 mg by mouth daily.    ? aspirin EC 81 MG tablet Take 81 mg by mouth daily. Swallow whole.    ? Cholecalciferol (VITAMIN D3) 125 MCG (5000 UT) CAPS Take 1 capsule by mouth daily.    ? Cyanocobalamin (VITAMIN B12 PO) Take 5,000 mg by mouth daily.    ? desonide (DESOWEN) 0.05 % cream APPLY TO FACE TOPICALLY DAILY AS NEEDED 30 g 1  ? docusate sodium (COLACE) 50 MG capsule Take 50 mg by mouth 2 (two) times daily.    ? folic acid (FOLVITE) 800 MCG tablet Take 400 mcg by mouth daily.    ? furosemide (LASIX) 20 MG tablet TAKE 1 TABLET BY MOUTH EVERY DAY 90 tablet 2  ? gabapentin (NEURONTIN) 300 MG capsule TAKE 1 CAPSULE BY MOUTH THREE TIMES A DAY 90 capsule 3  ? hydrochlorothiazide (HYDRODIURIL) 25 MG tablet TAKE 1 TABLET (25 MG TOTAL) BY MOUTH DAILY. 90 tablet 1  ? metoprolol succinate (TOPROL-XL) 25  MG 24 hr tablet TAKE 1 TABLET BY MOUTH EVERY DAY 90 tablet 2  ? omeprazole (PRILOSEC) 20 MG capsule Take 1 capsule (20 mg total) by mouth daily. 90 capsule 2  ? predniSONE (DELTASONE) 10 MG tablet TAKE 1 TABLET (10 MG TOTAL) BY MOUTH DAILY WITH BREAKFAST. 30 tablet 2  ? pyridOXINE (VITAMIN B-6) 100 MG tablet Take 100 mg by mouth daily.    ? rosuvastatin (CRESTOR) 10 MG tablet TAKE 1 TABLET BY MOUTH EVERY DAY 90 tablet 2  ? tapentadol (NUCYNTA) 100 MG 12 hr tablet Take 100 mg by mouth every 12 (twelve) hours.    ? thiamine  (VITAMIN B-1) 100 MG tablet Take 100 mg by mouth daily.    ? triamcinolone cream (KENALOG) 0.1 % APPLY TO LEGS TOPICALLY TWICE A DAY AS NEEDED 30 g 2  ? valsartan (DIOVAN) 160 MG tablet TAKE 1 TABLET BY MOUTH EVERY DAY 90 tablet 1  ? ?No current facility-administered medications on file prior to visit.  ? ?Past Medical History:  ?Diagnosis Date  ? Anxiety   ? Depression   ? GERD (gastroesophageal reflux disease)   ? Hypertension   ? Peripheral neuropathy   ? ?Allergies  ?Allergen Reactions  ? Epinephrine Other (See Comments) and Palpitations  ?  "Heart Races Uncomfortably" ?  ? ?Social History  ? ?Socioeconomic History  ? Marital status: Married  ?  Spouse name: Not on file  ? Number of children: 1  ? Years of education: Not on file  ? Highest education level: Associate degree: academic program  ?Occupational History  ? Occupation: retired  ?Tobacco Use  ? Smoking status: Former  ?  Types: Cigarettes  ? Smokeless tobacco: Never  ?Vaping Use  ? Vaping Use: Never used  ?Substance and Sexual Activity  ? Alcohol use: Yes  ?  Alcohol/week: 3.0 standard drinks  ?  Types: 3 Glasses of wine per week  ?  Comment: Drinks Wine  ? Drug use: Yes  ?  Types: Other-see comments  ?  Comment: nucytha  ? Sexual activity: Not on file  ?Other Topics Concern  ? Not on file  ?Social History Narrative  ? Right handed  ? One story home  ? Drinks occasional caffeine  ? ?Social Determinants of Health  ? ?Financial Resource Strain: Low Risk   ? Difficulty of Paying Living Expenses: Not hard at all  ?Food Insecurity: No Food Insecurity  ? Worried About Programme researcher, broadcasting/film/videounning Out of Food in the Last Year: Never true  ? Ran Out of Food in the Last Year: Never true  ?Transportation Needs: No Transportation Needs  ? Lack of Transportation (Medical): No  ? Lack of Transportation (Non-Medical): No  ?Physical Activity: Insufficiently Active  ? Days of Exercise per Week: 2 days  ? Minutes of Exercise per Session: 40 min  ?Stress: Stress Concern Present  ? Feeling of  Stress : To some extent  ?Social Connections: Not on file  ? ?Vitals:  ? 01/23/22 1413  ?BP: 120/70  ?Pulse: (!) 102  ?Resp: 16  ?Temp: 97.8 ?F (36.6 ?C)  ?SpO2: 98%  ? ?Body mass index is 31.28 kg/m?. ? ?Physical Exam ?Vitals and nursing note reviewed.  ?Constitutional:   ?   General: She is not in acute distress. ?   Appearance: She is well-developed.  ?HENT:  ?   Head: Normocephalic and atraumatic.  ?   Mouth/Throat:  ?   Mouth: Mucous membranes are dry.  ?   Pharynx: Oropharynx is  clear.  ?Eyes:  ?   Conjunctiva/sclera: Conjunctivae normal.  ?Cardiovascular:  ?   Rate and Rhythm: Regular rhythm. Tachycardia present.  ?   Heart sounds: Murmur (SEM I-II/VI [id number]) heard.  ?Pulmonary:  ?   Effort: Pulmonary effort is normal. No respiratory distress.  ?   Breath sounds: Normal breath sounds.  ?Abdominal:  ?   Palpations: Abdomen is soft. There is no mass.  ?   Tenderness: There is no abdominal tenderness. There is no right CVA tenderness or left CVA tenderness.  ?Skin: ?   General: Skin is warm.  ?   Findings: No erythema.  ?Neurological:  ?   Mental Status: She is alert.  ?   Comments: She does not remember date. ?Oriented in person and place. ?Mildly unstable gait, not assisted.  ? ?ASSESSMENT AND PLAN: ? ?Ms.Carlie was seen today for dysuria. ? ?Diagnoses and all orders for this visit: ?Orders Placed This Encounter  ?Procedures  ? Culture, Urine  ? Basic metabolic panel  ? CBC with Differential/Platelet  ? Hepatic function panel  ? POCT urinalysis dipstick  ? POC HgB A1c  ? ?Lab Results  ?Component Value Date  ? WBC 9.1 01/23/2022  ? HGB 13.5 01/23/2022  ? HCT 39.5 01/23/2022  ? MCV 97.8 01/23/2022  ? PLT 290.0 01/23/2022  ? ?Lab Results  ?Component Value Date  ? CREATININE 2.00 (H) 01/23/2022  ? BUN 29 (H) 01/23/2022  ? NA 133 (L) 01/23/2022  ? K 3.5 01/23/2022  ? CL 90 (L) 01/23/2022  ? CO2 27 01/23/2022  ? ?Lab Results  ?Component Value Date  ? ALT 19 01/23/2022  ? AST 31 01/23/2022  ? ALKPHOS 49  01/23/2022  ? BILITOT 0.8 01/23/2022  ? ?Lab Results  ?Component Value Date  ? HGBA1C 5.8 01/23/2022  ? ?Confusion state ?We discussed possible etiologies.  ?Hemodynamically stable, I do not think she needs to go to th

## 2022-01-23 NOTE — Patient Instructions (Addendum)
A few things to remember from today's visit: ? ? ?Dysuria - Plan: POCT urinalysis dipstick, Culture, Urine, Culture, Urine ? ?Polydipsia ? ?Urinary tract infection without hematuria, site unspecified - Plan: sulfamethoxazole-trimethoprim (BACTRIM DS) 800-160 MG tablet ? ?Confusion state - Plan: Basic metabolic panel, CBC with Differential/Platelet, Hepatic function panel ? ?If you need refills please call your pharmacy. ?Do not use My Chart to request refills or for acute issues that need immediate attention. ?  ?Please be sure medication list is accurate. ?If a new problem present, please set up appointment sooner than planned today. ? ?Monitor for new symptoms. ?If any gets worse you need to go to the ER. ?

## 2022-01-24 ENCOUNTER — Ambulatory Visit: Payer: Medicare PPO

## 2022-01-24 LAB — CBC WITH DIFFERENTIAL/PLATELET
Basophils Absolute: 0.1 10*3/uL (ref 0.0–0.1)
Basophils Relative: 0.8 % (ref 0.0–3.0)
Eosinophils Absolute: 0.1 10*3/uL (ref 0.0–0.7)
Eosinophils Relative: 1.3 % (ref 0.0–5.0)
HCT: 39.5 % (ref 36.0–46.0)
Hemoglobin: 13.5 g/dL (ref 12.0–15.0)
Lymphocytes Relative: 14.9 % (ref 12.0–46.0)
Lymphs Abs: 1.4 10*3/uL (ref 0.7–4.0)
MCHC: 34.1 g/dL (ref 30.0–36.0)
MCV: 97.8 fl (ref 78.0–100.0)
Monocytes Absolute: 0.7 10*3/uL (ref 0.1–1.0)
Monocytes Relative: 7.4 % (ref 3.0–12.0)
Neutro Abs: 6.9 10*3/uL (ref 1.4–7.7)
Neutrophils Relative %: 75.6 % (ref 43.0–77.0)
Platelets: 290 10*3/uL (ref 150.0–400.0)
RBC: 4.04 Mil/uL (ref 3.87–5.11)
RDW: 13.6 % (ref 11.5–15.5)
WBC: 9.1 10*3/uL (ref 4.0–10.5)

## 2022-01-24 LAB — HEPATIC FUNCTION PANEL
ALT: 19 U/L (ref 0–35)
AST: 31 U/L (ref 0–37)
Albumin: 4.6 g/dL (ref 3.5–5.2)
Alkaline Phosphatase: 49 U/L (ref 39–117)
Bilirubin, Direct: 0.3 mg/dL (ref 0.0–0.3)
Total Bilirubin: 0.8 mg/dL (ref 0.2–1.2)
Total Protein: 7.7 g/dL (ref 6.0–8.3)

## 2022-01-24 LAB — BASIC METABOLIC PANEL
BUN: 29 mg/dL — ABNORMAL HIGH (ref 6–23)
CO2: 27 mEq/L (ref 19–32)
Calcium: 10.5 mg/dL (ref 8.4–10.5)
Chloride: 90 mEq/L — ABNORMAL LOW (ref 96–112)
Creatinine, Ser: 2 mg/dL — ABNORMAL HIGH (ref 0.40–1.20)
GFR: 23.96 mL/min — ABNORMAL LOW (ref >60.00)
Glucose, Bld: 74 mg/dL (ref 70–99)
Potassium: 3.5 mEq/L (ref 3.5–5.1)
Sodium: 133 mEq/L — ABNORMAL LOW (ref 135–145)

## 2022-01-24 LAB — URINE CULTURE
MICRO NUMBER:: 13128620
SPECIMEN QUALITY:: ADEQUATE

## 2022-01-25 ENCOUNTER — Telehealth: Payer: Self-pay | Admitting: Family Medicine

## 2022-01-25 NOTE — Telephone Encounter (Signed)
Pt husband call and stated he need a call back to tell him which medications dr. [place] don't want her to take. ?

## 2022-01-26 ENCOUNTER — Other Ambulatory Visit (INDEPENDENT_AMBULATORY_CARE_PROVIDER_SITE_OTHER): Payer: Medicare PPO

## 2022-01-26 ENCOUNTER — Emergency Department (HOSPITAL_COMMUNITY): Payer: Medicare PPO

## 2022-01-26 ENCOUNTER — Encounter (HOSPITAL_COMMUNITY): Payer: Self-pay

## 2022-01-26 ENCOUNTER — Other Ambulatory Visit: Payer: Self-pay

## 2022-01-26 ENCOUNTER — Observation Stay (HOSPITAL_COMMUNITY)
Admission: EM | Admit: 2022-01-26 | Discharge: 2022-01-28 | Disposition: A | Discharge status: Home / Self Care | Payer: Medicare PPO | Attending: Family Medicine | Admitting: Family Medicine

## 2022-01-26 DIAGNOSIS — Z20822 Contact with and (suspected) exposure to covid-19: Secondary | ICD-10-CM | POA: Diagnosis not present

## 2022-01-26 DIAGNOSIS — Z87891 Personal history of nicotine dependence: Secondary | ICD-10-CM | POA: Diagnosis not present

## 2022-01-26 DIAGNOSIS — R4182 Altered mental status, unspecified: Secondary | ICD-10-CM | POA: Diagnosis not present

## 2022-01-26 DIAGNOSIS — I5030 Unspecified diastolic (congestive) heart failure: Secondary | ICD-10-CM | POA: Insufficient documentation

## 2022-01-26 DIAGNOSIS — G9340 Encephalopathy, unspecified: Secondary | ICD-10-CM | POA: Insufficient documentation

## 2022-01-26 DIAGNOSIS — Z79899 Other long term (current) drug therapy: Secondary | ICD-10-CM | POA: Diagnosis not present

## 2022-01-26 DIAGNOSIS — N179 Acute kidney failure, unspecified: Principal | ICD-10-CM | POA: Insufficient documentation

## 2022-01-26 DIAGNOSIS — F05 Delirium due to known physiological condition: Secondary | ICD-10-CM

## 2022-01-26 DIAGNOSIS — G894 Chronic pain syndrome: Secondary | ICD-10-CM | POA: Diagnosis present

## 2022-01-26 DIAGNOSIS — E782 Mixed hyperlipidemia: Secondary | ICD-10-CM | POA: Insufficient documentation

## 2022-01-26 DIAGNOSIS — F1020 Alcohol dependence, uncomplicated: Secondary | ICD-10-CM | POA: Diagnosis present

## 2022-01-26 DIAGNOSIS — I110 Hypertensive heart disease with heart failure: Secondary | ICD-10-CM | POA: Insufficient documentation

## 2022-01-26 DIAGNOSIS — N183 Chronic kidney disease, stage 3 unspecified: Secondary | ICD-10-CM | POA: Diagnosis present

## 2022-01-26 DIAGNOSIS — Z7982 Long term (current) use of aspirin: Secondary | ICD-10-CM | POA: Insufficient documentation

## 2022-01-26 DIAGNOSIS — I10 Essential (primary) hypertension: Secondary | ICD-10-CM | POA: Diagnosis present

## 2022-01-26 DIAGNOSIS — G6289 Other specified polyneuropathies: Secondary | ICD-10-CM

## 2022-01-26 DIAGNOSIS — R410 Disorientation, unspecified: Secondary | ICD-10-CM | POA: Diagnosis not present

## 2022-01-26 DIAGNOSIS — R0602 Shortness of breath: Secondary | ICD-10-CM | POA: Diagnosis not present

## 2022-01-26 LAB — COMPREHENSIVE METABOLIC PANEL
ALT: 21 U/L (ref 0–44)
AST: 27 U/L (ref 15–41)
Albumin: 4.1 g/dL (ref 3.5–5.0)
Alkaline Phosphatase: 46 U/L (ref 38–126)
Anion gap: 18 — ABNORMAL HIGH (ref 5–15)
BUN: 27 mg/dL — ABNORMAL HIGH (ref 8–23)
CO2: 22 mmol/L (ref 22–32)
Calcium: 9.6 mg/dL (ref 8.9–10.3)
Chloride: 94 mmol/L — ABNORMAL LOW (ref 98–111)
Creatinine, Ser: 2.58 mg/dL — ABNORMAL HIGH (ref 0.44–1.00)
GFR, Estimated: 19 mL/min — ABNORMAL LOW (ref >60)
Glucose, Bld: 76 mg/dL (ref 70–99)
Potassium: 3.3 mmol/L — ABNORMAL LOW (ref 3.5–5.1)
Sodium: 134 mmol/L — ABNORMAL LOW (ref 135–145)
Total Bilirubin: 1.1 mg/dL (ref 0.3–1.2)
Total Protein: 7.5 g/dL (ref 6.5–8.1)

## 2022-01-26 LAB — URINALYSIS, ROUTINE W REFLEX MICROSCOPIC
Bilirubin Urine: NEGATIVE
Glucose, UA: NEGATIVE mg/dL
Hgb urine dipstick: NEGATIVE
Ketones, ur: 20 mg/dL — AB
Leukocytes,Ua: NEGATIVE
Nitrite: NEGATIVE
Protein, ur: NEGATIVE mg/dL
Specific Gravity, Urine: 1.018 (ref 1.005–1.030)
pH: 5 (ref 5.0–8.0)

## 2022-01-26 LAB — CBC WITH DIFFERENTIAL/PLATELET
Abs Immature Granulocytes: 0.05 10*3/uL (ref 0.00–0.07)
Basophils Absolute: 0.1 10*3/uL (ref 0.0–0.1)
Basophils Relative: 1 %
Eosinophils Absolute: 0 10*3/uL (ref 0.0–0.5)
Eosinophils Relative: 0 %
HCT: 35.9 % — ABNORMAL LOW (ref 36.0–46.0)
Hemoglobin: 12 g/dL (ref 12.0–15.0)
Immature Granulocytes: 1 %
Lymphocytes Relative: 13 %
Lymphs Abs: 1.2 10*3/uL (ref 0.7–4.0)
MCH: 32.5 pg (ref 26.0–34.0)
MCHC: 33.4 g/dL (ref 30.0–36.0)
MCV: 97.3 fL (ref 80.0–100.0)
Monocytes Absolute: 0.7 10*3/uL (ref 0.1–1.0)
Monocytes Relative: 8 %
Neutro Abs: 7.1 10*3/uL (ref 1.7–7.7)
Neutrophils Relative %: 77 %
Platelets: 236 10*3/uL (ref 150–400)
RBC: 3.69 MIL/uL — ABNORMAL LOW (ref 3.87–5.11)
RDW: 12.5 % (ref 11.5–15.5)
WBC: 9.1 10*3/uL (ref 4.0–10.5)
nRBC: 0 % (ref 0.0–0.2)

## 2022-01-26 LAB — BASIC METABOLIC PANEL
BUN: 29 mg/dL — ABNORMAL HIGH (ref 6–23)
CO2: 24 mEq/L (ref 19–32)
Calcium: 10.2 mg/dL (ref 8.4–10.5)
Chloride: 93 mEq/L — ABNORMAL LOW (ref 96–112)
Creatinine, Ser: 2.57 mg/dL — ABNORMAL HIGH (ref 0.40–1.20)
GFR: 17.73 mL/min — ABNORMAL LOW (ref >60.00)
Glucose, Bld: 85 mg/dL (ref 70–99)
Potassium: 3.1 mEq/L — ABNORMAL LOW (ref 3.5–5.1)
Sodium: 134 mEq/L — ABNORMAL LOW (ref 135–145)

## 2022-01-26 LAB — LIPASE, BLOOD: Lipase: 33 U/L (ref 11–51)

## 2022-01-26 LAB — AMMONIA: Ammonia: 13 umol/L (ref 9–35)

## 2022-01-26 LAB — MAGNESIUM: Magnesium: 2.5 mg/dL — ABNORMAL HIGH (ref 1.7–2.4)

## 2022-01-26 LAB — ETHANOL: Alcohol, Ethyl (B): <10 mg/dL (ref <10)

## 2022-01-26 MED ORDER — LACTATED RINGERS IV SOLN: INTRAVENOUS | Status: DC

## 2022-01-26 MED ORDER — THIAMINE HCL 100 MG/ML IJ SOLN
100.0000 mg | Freq: Every day | INTRAMUSCULAR | Status: DC
  Administered 2022-01-27: 100 mg via INTRAVENOUS
  Filled 2022-01-26: qty 2

## 2022-01-26 NOTE — ED Provider Notes (Signed)
?[facility] ?Provider Note ? ? ?CSN: [id number] ?Arrival date & time: 01/26/22  1703 ? ?  ? ?History ? ?Chief Complaint  ?Patient presents with  ? Abnormal Lab  ? ? ?Rebecca Baird is a 76 y.o. female. ? ?The history is provided by the patient, medical records and the spouse.  ?Abnormal Lab ?Rebecca Baird is a 76 y.o. female who presents to the Emergency Department complaining of abnormal lab.  She presents to the emergency department accompanied by her husband for evaluation of abnormal lab.   ? ?She feels confused for the last week.  Has not felt well this week. Has difficulty with attention, answering questions, feels foggy.  She has burning with urination since Friday.   ? ?No fever, ha, cp, ap, N/V.  Has nasal congestion.  Has mild sob.  Has decreased oral intake due to lack of interest.   ? ?No tobacco.  No alcohol this week but usually drink 1.5 L chardonnay per day.  Stopped drinking Friday due to feeling poorly.  No street drugs.   ? ?Has HTN ?  ? ?Home Medications ?Prior to Admission medications   ?Medication Sig Start Date End Date Taking? Authorizing Provider  ?albuterol (VENTOLIN HFA) 108 (90 Base) MCG/ACT inhaler Inhale 2 puffs into the lungs every 6 (six) hours as needed for wheezing or shortness of breath. 11/09/21  Yes Martin[place], Betty G, MD  ?alendronate (FOSAMAX) 70 MG tablet Take 1 tablet (70 mg total) by mouth every 7 (seven) days. Take with a full glass of water on an empty stomach. ?Patient taking differently: Take 70 mg by mouth every Monday. Take with a full glass of water on an empty stomach. 05/31/21  Yes [place], Betty G, MD  ?Ascorbic Acid (VITAMIN C) 1000 MG tablet Take 1,000 mg by mouth daily.   Yes [provider]  ?aspirin EC 81 MG tablet Take 81 mg by mouth daily. Swallow whole.   Yes [provider]  ?Cholecalciferol (VITAMIN D3) 125 MCG (5000 UT) CAPS Take 1 capsule by mouth daily.   Yes [provider]   ?Cyanocobalamin (VITAMIN B12 PO) Take 5,000 mg by mouth daily.   Yes [provider]  ?desonide (DESOWEN) 0.05 % cream APPLY TO FACE TOPICALLY DAILY AS NEEDED ?Patient taking differently: Apply 1 application. topically daily as needed (rash on face). 10/13/21  Yes [place], Betty G, MD  ?docusate sodium (COLACE) 50 MG capsule Take 50 mg by mouth 3 (three) times daily.   Yes [provider]  ?folic acid (FOLVITE) [id number] MCG tablet Take 400 mcg by mouth daily.   Yes [provider]  ?furosemide (LASIX) 20 MG tablet TAKE 1 TABLET BY MOUTH EVERY DAY ?Patient taking differently: Take 20 mg by mouth daily. 07/31/21  Yes [place], Betty G, MD  ?gabapentin (NEURONTIN) 300 MG capsule TAKE 1 CAPSULE BY MOUTH THREE TIMES A DAY ?Patient taking differently: Take 300 mg by mouth 3 (three) times daily. 09/27/21  Yes [place], Betty G, MD  ?hydrochlorothiazide (HYDRODIURIL) 25 MG tablet TAKE 1 TABLET (25 MG TOTAL) BY MOUTH DAILY. 11/14/21  Yes [place], Betty G, MD  ?metoprolol succinate (TOPROL-XL) 25 MG 24 hr tablet TAKE 1 TABLET BY MOUTH EVERY DAY ?Patient taking differently: Take 25 mg by mouth daily. 07/18/21  Yes [place], Betty G, MD  ?omeprazole (PRILOSEC) 20 MG capsule Take 1 capsule (20 mg total) by mouth daily. 07/14/21  Yes [place], Betty G, MD  ?pyridOXINE (VITAMIN B-6) 100 MG  tablet Take 100 mg by mouth daily.   Yes [provider]  ?rosuvastatin (CRESTOR) 10 MG tablet TAKE 1 TABLET BY MOUTH EVERY DAY ?Patient taking differently: Take 10 mg by mouth daily. 09/27/21  Yes [place], Betty G, MD  ?sulfamethoxazole-trimethoprim (BACTRIM DS) 800-160 MG tablet Take 1 tablet by mouth 2 (two) times daily for 5 days. ?Patient taking differently: Take 1 tablet by mouth See admin instructions. Bid x 5 days 01/23/22 01/28/22 Yes [place], Betty G, MD  ?tapentadol (NUCYNTA) 100 MG 12 hr tablet Take 100 mg by mouth every 12 (twelve) hours.   Yes [provider]  ?thiamine (VITAMIN B-1) 100 MG tablet Take 100 mg by  mouth daily.   Yes [provider]  ?triamcinolone cream (KENALOG) 0.1 % APPLY TO LEGS TOPICALLY TWICE A DAY AS NEEDED ?Patient taking differently: Apply 1 application. topically 2 (two) times daily as needed (rash on legs). 01/05/22  Yes [place], Betty G, MD  ?valsartan (DIOVAN) 160 MG tablet TAKE 1 TABLET BY MOUTH EVERY DAY ?Patient taking differently: Take 160 mg by mouth daily. 08/18/21  Yes [place], Betty G, MD  ?   ? ?Allergies    ?Epinephrine   ? ?Review of Systems   ?Review of Systems  ?All other systems reviewed and are negative. ? ?Physical Exam ?Updated Vital Signs ?BP (!) 121/49 (BP Location: Right Arm)   Pulse (!) 106   Temp 97.8 ?F (36.6 ?C) (Oral)   Resp 16   Ht 5\' 5"  (1.651 m)   Wt 85.3 kg   SpO2 (!) 77%   BMI 31.28 kg/m?  ?Physical Exam ?Vitals and nursing note reviewed.  ?Constitutional:   ?   Appearance: She is well-developed.  ?HENT:  ?   Head: Normocephalic and atraumatic.  ?Cardiovascular:  ?   Rate and Rhythm: Normal rate and regular rhythm.  ?   Heart sounds: Murmur heard.  ?Pulmonary:  ?   Effort: Pulmonary effort is normal. No respiratory distress.  ?   Breath sounds: Normal breath sounds.  ?Abdominal:  ?   Palpations: Abdomen is soft.  ?   Tenderness: There is no abdominal tenderness. There is no guarding or rebound.  ?Musculoskeletal:     ?   General: No tenderness.  ?   Comments: Trace pitting edema  ?Skin: ?   General: Skin is warm and dry.  ?Neurological:  ?   Mental Status: She is alert and oriented to person, place, and time.  ?   Comments: 5/5 strength in all four extremities with sensation to light touch intact in all four extremities.  Mildly confused.    ?Psychiatric:     ?   Behavior: Behavior normal.  ? ? ?ED Results / Procedures / Treatments   ?Labs ?(all labs ordered are listed, but only abnormal results are displayed) ?Labs Reviewed  ?COMPREHENSIVE METABOLIC PANEL - Abnormal; Notable for the following components:  ?    Result Value  ? Sodium 134 (*)   ? Potassium  3.3 (*)   ? Chloride 94 (*)   ? BUN 27 (*)   ? Creatinine, Ser 2.58 (*)   ? GFR, Estimated 19 (*)   ? Anion gap 18 (*)   ? All other components within normal limits  ?CBC WITH DIFFERENTIAL/PLATELET - Abnormal; Notable for the following components:  ? RBC 3.69 (*)   ? HCT 35.9 (*)   ? All other components within normal limits  ?URINALYSIS, ROUTINE W REFLEX MICROSCOPIC - Abnormal; Notable for the  following components:  ? Color, Urine AMBER (*)   ? APPearance HAZY (*)   ? Ketones, ur 20 (*)   ? All other components within normal limits  ?MAGNESIUM - Abnormal; Notable for the following components:  ? Magnesium 2.5 (*)   ? All other components within normal limits  ?BASIC METABOLIC PANEL - Abnormal; Notable for the following components:  ? Sodium 132 (*)   ? Potassium 3.3 (*)   ? Chloride 95 (*)   ? CO2 21 (*)   ? BUN 28 (*)   ? Creatinine, Ser 2.37 (*)   ? GFR, Estimated 21 (*)   ? Anion gap 16 (*)   ? All other components within normal limits  ?CBC - Abnormal; Notable for the following components:  ? RBC 3.48 (*)   ? Hemoglobin 11.6 (*)   ? HCT 32.9 (*)   ? All other components within normal limits  ?RESP PANEL BY RT-PCR (FLU A&B, COVID) [id number]  ?URINE CULTURE  ?LIPASE, BLOOD  ?AMMONIA  ?ETHANOL  ?VITAMIN B1  ? ? ?EKG ?None ? ?Radiology ?CT HEAD WO CONTRAST (5MM) ? ?Result Date: 01/26/2022 ?CLINICAL DATA:  Mental status change, unknown cause EXAM: CT HEAD WITHOUT CONTRAST TECHNIQUE: Contiguous axial images were obtained from the base of the skull through the vertex without intravenous contrast. RADIATION DOSE REDUCTION: This exam was performed according to the departmental dose-optimization program which includes automated exposure control, adjustment of the mA and/or kV according to patient size and/or use of iterative reconstruction technique. COMPARISON:  None. BRAIN: BRAIN Cerebral ventricle sizes are concordant with the degree of cerebral volume loss. No evidence of large-territorial acute infarction. No  parenchymal hemorrhage. No mass lesion. No extra-axial collection. No mass effect or midline shift. No hydrocephalus. Basilar cisterns are patent. Empty sella. Vascular: No hyperdense vessel. Atherosclerotic calcifica

## 2022-01-26 NOTE — ED Triage Notes (Signed)
Pt reports she had blood work done today at PCP and was told to come to the ER because she is dehydrated and needs IV fluid. She reports she has not eaten much all week and states she feels tired. Denies any pain. ?

## 2022-01-26 NOTE — ED Provider Triage Note (Signed)
Emergency Medicine Provider Triage Evaluation Note ? ?Georgena Spurling , a 76 y.o. female  was evaluated in triage.  Pt complains of confusion and dehydration.  History is provided by patient, her husband at bedside, and chart review. ?Patient stopped drinking alcohol suddenly a week ago.  She previously drank a liter of wine a day.  Her husband states that she had some mild shakes however otherwise did not have any symptoms.  No hallucinations or seizures. ?She has been confused and having difficulty remembering things like the year.  They went to PCP and had lab work done and she started getting treated for UTI.  Husband feels like this is improved today compared to previous days, however on repeat labs today she had a increased creatinine and they were sent here.  Patient states she has not had any alcohol. ? ? ? ?Physical Exam  ?BP (!) 115/101 (BP Location: Left Arm)   Pulse 91   Temp (!) 97.5 ?F (36.4 ?C) (Oral)   Resp 19   Ht 5\' 5"  (1.651 m)   Wt 85.3 kg   SpO2 96%   BMI 31.28 kg/m?  ?Gen:   Awake, no distress   ?Resp:  Normal effort  ?MSK:   Moves extremities without difficulty  ?Other:  Right eye deviates, patient states she has had strabismus since birth. ?Patient is oriented to person and place, she knows the month however does not know the year. ? ?Medical Decision Making  ?Medically screening exam initiated at 5:58 PM.  Appropriate orders placed.  Bonnye Halle was informed that the remainder of the evaluation will be completed by another provider, this initial triage assessment does not replace that evaluation, and the importance of remaining in the ED until their evaluation is complete. ? ?Patient with multiple possible etiologies for her confusion, based on her age we will scan her head.  Additionally this. ?She does not have any noted tremor, has not been hallucinating and has not had any reported seizures ?  ?Georgena Spurling, Cristina Gong ?01/26/22 1802 ? ?

## 2022-01-26 NOTE — Telephone Encounter (Signed)
I called and spoke with patient and her husband. They were on their way to the clinic for her repeat lab work. Patient's husband asked if I could print her medication list and label what they are for and which ones to stop. I printed her medication list and marked it for him. He is aware it is up front for him to pick up when they come in.  ?

## 2022-01-27 DIAGNOSIS — F1020 Alcohol dependence, uncomplicated: Secondary | ICD-10-CM | POA: Diagnosis not present

## 2022-01-27 DIAGNOSIS — G9340 Encephalopathy, unspecified: Secondary | ICD-10-CM | POA: Diagnosis present

## 2022-01-27 DIAGNOSIS — G894 Chronic pain syndrome: Secondary | ICD-10-CM

## 2022-01-27 DIAGNOSIS — N179 Acute kidney failure, unspecified: Secondary | ICD-10-CM | POA: Diagnosis present

## 2022-01-27 DIAGNOSIS — I5031 Acute diastolic (congestive) heart failure: Secondary | ICD-10-CM | POA: Diagnosis not present

## 2022-01-27 DIAGNOSIS — N183 Chronic kidney disease, stage 3 unspecified: Secondary | ICD-10-CM | POA: Diagnosis present

## 2022-01-27 LAB — BASIC METABOLIC PANEL
Anion gap: 16 — ABNORMAL HIGH (ref 5–15)
BUN: 28 mg/dL — ABNORMAL HIGH (ref 8–23)
CO2: 21 mmol/L — ABNORMAL LOW (ref 22–32)
Calcium: 9.4 mg/dL (ref 8.9–10.3)
Chloride: 95 mmol/L — ABNORMAL LOW (ref 98–111)
Creatinine, Ser: 2.37 mg/dL — ABNORMAL HIGH (ref 0.44–1.00)
GFR, Estimated: 21 mL/min — ABNORMAL LOW (ref >60)
Glucose, Bld: 90 mg/dL (ref 70–99)
Potassium: 3.3 mmol/L — ABNORMAL LOW (ref 3.5–5.1)
Sodium: 132 mmol/L — ABNORMAL LOW (ref 135–145)

## 2022-01-27 LAB — CBC
HCT: 32.9 % — ABNORMAL LOW (ref 36.0–46.0)
Hemoglobin: 11.6 g/dL — ABNORMAL LOW (ref 12.0–15.0)
MCH: 33.3 pg (ref 26.0–34.0)
MCHC: 35.3 g/dL (ref 30.0–36.0)
MCV: 94.5 fL (ref 80.0–100.0)
Platelets: 218 10*3/uL (ref 150–400)
RBC: 3.48 MIL/uL — ABNORMAL LOW (ref 3.87–5.11)
RDW: 12.5 % (ref 11.5–15.5)
WBC: 8.7 10*3/uL (ref 4.0–10.5)
nRBC: 0 % (ref 0.0–0.2)

## 2022-01-27 LAB — RESP PANEL BY RT-PCR (FLU A&B, COVID) ARPGX2
Influenza A by PCR: NEGATIVE
Influenza B by PCR: NEGATIVE
SARS Coronavirus 2 by RT PCR: NEGATIVE

## 2022-01-27 MED ORDER — SODIUM CHLORIDE 0.9 % IV SOLN: INTRAVENOUS | Status: DC

## 2022-01-27 MED ORDER — METOPROLOL SUCCINATE ER 25 MG PO TB24
25.0000 mg | ORAL_TABLET | Freq: Every day | ORAL | Status: DC
  Administered 2022-01-27 – 2022-01-28 (×2): 25 mg via ORAL
  Filled 2022-01-27 (×2): qty 1

## 2022-01-27 MED ORDER — THIAMINE HCL 100 MG PO TABS
100.0000 mg | ORAL_TABLET | Freq: Every day | ORAL | Status: DC
  Administered 2022-01-28: 100 mg via ORAL
  Filled 2022-01-27: qty 1

## 2022-01-27 MED ORDER — VITAMIN D 25 MCG (1000 UNIT) PO TABS
5000.0000 [IU] | ORAL_TABLET | Freq: Every day | ORAL | Status: DC
  Administered 2022-01-27 – 2022-01-28 (×2): 5000 [IU] via ORAL
  Filled 2022-01-27 (×2): qty 5

## 2022-01-27 MED ORDER — ENOXAPARIN SODIUM 30 MG/0.3ML IJ SOSY
30.0000 mg | PREFILLED_SYRINGE | INTRAMUSCULAR | Status: DC
  Administered 2022-01-27 – 2022-01-28 (×2): 30 mg via SUBCUTANEOUS
  Filled 2022-01-27 (×2): qty 0.3

## 2022-01-27 MED ORDER — LORAZEPAM 2 MG/ML IJ SOLN: 1.0000 mg | INTRAMUSCULAR | Status: DC | PRN

## 2022-01-27 MED ORDER — ADULT MULTIVITAMIN W/MINERALS CH
1.0000 | ORAL_TABLET | Freq: Every day | ORAL | Status: DC
  Administered 2022-01-28: 1 via ORAL
  Filled 2022-01-27 (×2): qty 1

## 2022-01-27 MED ORDER — ONDANSETRON HCL 4 MG PO TABS: 4.0000 mg | ORAL_TABLET | Freq: Four times a day (QID) | ORAL | Status: DC | PRN

## 2022-01-27 MED ORDER — ALBUTEROL SULFATE (2.5 MG/3ML) 0.083% IN NEBU: 2.5000 mg | INHALATION_SOLUTION | Freq: Four times a day (QID) | RESPIRATORY_TRACT | Status: DC | PRN

## 2022-01-27 MED ORDER — LORAZEPAM 1 MG PO TABS: 1.0000 mg | ORAL_TABLET | ORAL | Status: DC | PRN

## 2022-01-27 MED ORDER — FOLIC ACID 1 MG PO TABS
1.0000 mg | ORAL_TABLET | Freq: Every day | ORAL | Status: DC
  Administered 2022-01-27 – 2022-01-28 (×2): 1 mg via ORAL
  Filled 2022-01-27 (×2): qty 1

## 2022-01-27 MED ORDER — ACETAMINOPHEN 650 MG RE SUPP: 650.0000 mg | Freq: Four times a day (QID) | RECTAL | Status: DC | PRN

## 2022-01-27 MED ORDER — ASPIRIN EC 81 MG PO TBEC
81.0000 mg | DELAYED_RELEASE_TABLET | Freq: Every day | ORAL | Status: DC
Start: 2022-01-27 — End: 2022-01-28
  Administered 2022-01-27 – 2022-01-28 (×2): 81 mg via ORAL
  Filled 2022-01-27 (×2): qty 1

## 2022-01-27 MED ORDER — ONDANSETRON HCL 4 MG/2ML IJ SOLN
4.0000 mg | Freq: Four times a day (QID) | INTRAMUSCULAR | Status: DC | PRN
Start: 2022-01-27 — End: 2022-01-28
  Administered 2022-01-28: 4 mg via INTRAVENOUS
  Filled 2022-01-27: qty 2

## 2022-01-27 MED ORDER — ACETAMINOPHEN 325 MG PO TABS: 650.0000 mg | ORAL_TABLET | Freq: Four times a day (QID) | ORAL | Status: DC | PRN

## 2022-01-27 MED ORDER — PANTOPRAZOLE SODIUM 40 MG PO TBEC
40.0000 mg | DELAYED_RELEASE_TABLET | Freq: Every day | ORAL | Status: DC
  Administered 2022-01-27 – 2022-01-28 (×2): 40 mg via ORAL
  Filled 2022-01-27 (×2): qty 1

## 2022-01-27 MED ORDER — VITAMIN B-6 100 MG PO TABS
100.0000 mg | ORAL_TABLET | Freq: Every day | ORAL | Status: DC
  Administered 2022-01-27 – 2022-01-28 (×2): 100 mg via ORAL
  Filled 2022-01-27 (×2): qty 1

## 2022-01-27 MED ORDER — ROSUVASTATIN CALCIUM 5 MG PO TABS
10.0000 mg | ORAL_TABLET | Freq: Every day | ORAL | Status: DC
  Administered 2022-01-27 – 2022-01-28 (×2): 10 mg via ORAL
  Filled 2022-01-27 (×2): qty 2

## 2022-01-27 MED ORDER — POTASSIUM CHLORIDE CRYS ER 20 MEQ PO TBCR
20.0000 meq | EXTENDED_RELEASE_TABLET | Freq: Once | ORAL | Status: AC
  Administered 2022-01-27: 20 meq via ORAL
  Filled 2022-01-27: qty 1

## 2022-01-27 MED ORDER — THIAMINE HCL 100 MG/ML IJ SOLN
500.0000 mg | Freq: Once | INTRAVENOUS | Status: AC
  Administered 2022-01-27: 500 mg via INTRAVENOUS
  Filled 2022-01-27: qty 5

## 2022-01-27 NOTE — Assessment & Plan Note (Addendum)
Possibly medication related in setting of renal impairment and poor filtration. Patient is on gabapentin and tapentadol as an outpatient. Mental status appears to be resolved.  ?

## 2022-01-27 NOTE — Assessment & Plan Note (Addendum)
Tapentadol held on admission. Resumed on discharge, but may need a different regimen if patient shows evidence of chronic kidney disease. Gabapentin dose reduced for renal function but this may be resumed at previous dose once creatinine has reached baseline. ?

## 2022-01-27 NOTE — Assessment & Plan Note (Addendum)
CIWA while admitted. Thiamine given. ?

## 2022-01-27 NOTE — Assessment & Plan Note (Addendum)
Likely related to dehydration but patient has also recently been on Bactrim. Baseline creatinine of about 1.2 from 06/2021 and more recently 2 from 3/14. Creatinine on admission of 2.58 and is improving with IV fluids. Associated elevated anion gap metabolic acidosis. Home Lasix  and valsartan held on admission. AKI down to 1.99 on day of discharge. Medication list adjusted for current renal function. Repeat metabolic panel in 3-5 days as an outpatient is recommended. ?

## 2022-01-27 NOTE — Progress Notes (Signed)
? ?  Patient seen and examined at bedside, patient admitted after midnight, please see earlier detailed admission note by Hillary Bow, DO. Briefly, patient presented at the request of her PCP for treatment of dehydration. Patient started on IV fluids for AKI. ? ?Subjective: ?No concerns this morning. Feels much better than before. Still feels a little confused, but is clearer. ? ?BP 132/82 (BP Location: Right Arm)   Pulse 85   Temp 97.9 ?F (36.6 ?C) (Oral)   Resp 20   Ht 5\' 5"  (1.651 m)   Wt 85.3 kg   SpO2 98%   BMI 31.28 kg/m?  ? ?General exam: Appears calm and comfortable ?Respiratory system: Clear to auscultation. Respiratory effort normal. ?Cardiovascular system: S1 & S2 heard, RRR. 1/6 systolic murmur ?Gastrointestinal system: Abdomen is nondistended, soft and nontender. No organomegaly or masses felt. Normal bowel sounds heard. ?Central nervous system: Alert and oriented. No focal neurological deficits. ?Skin: No cyanosis. No rashes ?Psychiatry: Judgement and insight appear normal. Mood & affect appropriate.  ? ?Brief assessment/Plan: ? ?AKI ?Likely related to dehydration but patient has also recently been on Bactrim. Baseline creatinine of about 1.2 from 06/2021 and more recently 2 from 3/14. Creatinine on admission of 2.58 and is improving with IV fluids. Associated elevated anion gap metabolic acidosis. Home Lasix  and valsartan held on admission. AKI down to 2.37 this morning. ?-Continue IV fluids; switch to Normal Saline IV fluids ?-BMP in AM ?-Since creatinine is improving, will hold off on imaging ? ?Acute metabolic encephalopathy ?Possibly medication related in setting of renal impairment and poor filtration. Patient is on gabapentin and tapentadol as an outpatient. Mental status appears to be improved.  ? ?Alcohol use ?Chronic pain ?HFpRF ?Per H&P ? ?Primary hypertension ?-Continue home Toprol XL ? ?Hyperlipidemia ?-Continue home Crestor ? ?Family communication: None at bedside ?DVT  prophylaxis: Lovenox ?Disposition: Discharge home likely in 24 hours pending improved creatinine ? ?4/14, MD ?[facility] ?01/27/2022, 1:14 PM ?

## 2022-01-27 NOTE — H&P (Signed)
History and Physical    Patient: Rebecca Baird ZOX:[id number] DOB: [birth year] DOA: 01/26/2022 DOS: the patient was seen and examined on 01/27/2022 PCP: [place], Betty G, MD  Patient coming from: Home  Chief Complaint:  Chief Complaint  Patient presents with   Abnormal Lab   HPI: Rebecca Baird is a 76 y.o. female with medical history significant of CPS on chronic opiates, HTN.  Pt with ongoing EtOH use/abuse.  Husband estimates she drinks 1.5L of chardonnay a day.  No use in past few days however due to not feeling well.  Pt not feeling well this past week, poor PO intake.  PCP started her on bactrim on 3/14 for UTI.  Has also had persistent URI symptoms.  Per husband pt with AMS and confusion the past few days as well.   Review of Systems: As mentioned in the history of present illness. All other systems reviewed and are negative. Past Medical History:  Diagnosis Date   Anxiety    Depression    GERD (gastroesophageal reflux disease)    Hypertension    Peripheral neuropathy    Past Surgical History:  Procedure Laterality Date   broken ankle     cataract surgery     NOSE SURGERY     Social History:  reports that she has quit smoking. Her smoking use included cigarettes. She has never used smokeless tobacco. She reports current alcohol use of about 3.0 standard drinks per week. She reports current drug use. Drug: Other-see comments.  Allergies  Allergen Reactions   Epinephrine Other (See Comments) and Palpitations    "Heart Races Uncomfortably"     Family History  Problem Relation Age of Onset   Breast cancer Mother    Breast cancer Cousin     Prior to Admission medications   Medication Sig Start Date End Date Taking? Authorizing Provider  albuterol (VENTOLIN HFA) 108 (90 Base) MCG/ACT inhaler Inhale 2 puffs into the lungs every 6 (six) hours as needed for wheezing or shortness of breath. 11/09/21  Yes [place], Betty G, MD  alendronate (FOSAMAX) 70  MG tablet Take 1 tablet (70 mg total) by mouth every 7 (seven) days. Take with a full glass of water on an empty stomach. Patient taking differently: Take 70 mg by mouth every Monday. Take with a full glass of water on an empty stomach. 05/31/21  Yes [place], Betty G, MD  Ascorbic Acid (VITAMIN C) 1000 MG tablet Take 1,000 mg by mouth daily.   Yes [provider]  aspirin EC 81 MG tablet Take 81 mg by mouth daily. Swallow whole.   Yes [provider]  Cholecalciferol (VITAMIN D3) 125 MCG (5000 UT) CAPS Take 1 capsule by mouth daily.   Yes [provider]  Cyanocobalamin (VITAMIN B12 PO) Take 5,000 mg by mouth daily.   Yes [provider]  desonide (DESOWEN) 0.05 % cream APPLY TO FACE TOPICALLY DAILY AS NEEDED Patient taking differently: Apply 1 application. topically daily as needed (rash on face). 10/13/21  Yes [place], Betty G, MD  docusate sodium (COLACE) 50 MG capsule Take 50 mg by mouth 3 (three) times daily.   Yes [provider]  folic acid (FOLVITE) 800 MCG tablet Take 400 mcg by mouth daily.   Yes [provider]  furosemide (LASIX) 20 MG tablet TAKE 1 TABLET BY MOUTH EVERY DAY Patient taking differently: Take 20 mg by mouth daily. 07/31/21  Yes [place], Betty G, MD  gabapentin (NEURONTIN) 300 MG capsule TAKE  1 CAPSULE BY MOUTH THREE TIMES A DAY Patient taking differently: Take 300 mg by mouth 3 (three) times daily. 09/27/21  Yes [place], Betty G, MD  hydrochlorothiazide (HYDRODIURIL) 25 MG tablet TAKE 1 TABLET (25 MG TOTAL) BY MOUTH DAILY. 11/14/21  Yes [place], Betty G, MD  metoprolol succinate (TOPROL-XL) 25 MG 24 hr tablet TAKE 1 TABLET BY MOUTH EVERY DAY Patient taking differently: Take 25 mg by mouth daily. 07/18/21  Yes [place], Betty G, MD  omeprazole (PRILOSEC) 20 MG capsule Take 1 capsule (20 mg total) by mouth daily. 07/14/21  Yes [place], Betty G, MD  pyridOXINE (VITAMIN B-6) 100 MG tablet Take 100 mg by mouth daily.   Yes [provider]  rosuvastatin (CRESTOR) 10 MG tablet TAKE 1 TABLET BY MOUTH EVERY DAY Patient taking differently: Take 10 mg by mouth daily. 09/27/21  Yes [place], Betty G, MD  sulfamethoxazole-trimethoprim (BACTRIM DS) 800-160 MG tablet Take 1 tablet by mouth 2 (two) times daily for 5 days. Patient taking differently: Take 1 tablet by mouth See admin instructions. Bid x 5 days 01/23/22 01/28/22 Yes [place], Betty G, MD  tapentadol (NUCYNTA) 100 MG 12 hr tablet Take 100 mg by mouth every 12 (twelve) hours.   Yes [provider]  thiamine (VITAMIN B-1) 100 MG tablet Take 100 mg by mouth daily.   Yes [provider]  triamcinolone cream (KENALOG) 0.1 % APPLY TO LEGS TOPICALLY TWICE A DAY AS NEEDED Patient taking differently: Apply 1 application. topically 2 (two) times daily as needed (rash on legs). 01/05/22  Yes [place], Betty G, MD  valsartan (DIOVAN) 160 MG tablet TAKE 1 TABLET BY MOUTH EVERY DAY Patient taking differently: Take 160 mg by mouth daily. 08/18/21  Yes [place], Betty G, MD    Physical Exam: Vitals:   01/27/22 0245 01/27/22 0300 01/27/22 0315 01/27/22 0345  BP: (!) 118/54 (!) 127/50 (!) 109/56   Pulse: 76 85 80 76  Resp: 13 18 (!) 25 11  Temp:      TempSrc:      SpO2: 97% 97% 97% 98%  Weight:      Height:       Constitutional: NAD, calm, comfortable Eyes: PERRL, lids and conjunctivae normal ENMT: Mucous membranes are moist. Posterior pharynx clear of any exudate or lesions.Normal dentition.  Neck: normal, supple, no masses, no thyromegaly Respiratory: clear to auscultation bilaterally, no wheezing, no crackles. Normal respiratory effort. No accessory muscle use.  Cardiovascular: Regular rate and rhythm, no murmurs / rubs / gallops. No extremity edema. 2+ pedal pulses. No carotid bruits.  Abdomen: no tenderness, no masses palpated. No hepatosplenomegaly. Bowel sounds positive.  Musculoskeletal: no clubbing / cyanosis. No joint deformity upper and lower  extremities. Good ROM, no contractures. Normal muscle tone.  Skin: no rashes, lesions, ulcers. No induration Neurologic: CN 2-12 grossly intact. Sensation intact, DTR normal. Strength 5/5 in all 4.  Psychiatric: very mild confusion. Data Reviewed:    CBC    Component Value Date/Time   WBC 9.1 01/26/2022 1803   RBC 3.69 (L) 01/26/2022 1803   HGB 12.0 01/26/2022 1803   HCT 35.9 (L) 01/26/2022 1803   PLT 236 01/26/2022 1803   MCV 97.3 01/26/2022 1803   MCH 32.5 01/26/2022 1803   MCHC 33.4 01/26/2022 1803   RDW 12.5 01/26/2022 1803   LYMPHSABS 1.2 01/26/2022 1803   MONOABS 0.7 01/26/2022 1803   EOSABS 0.0 01/26/2022 1803   BASOSABS 0.1 01/26/2022 1803   CMP  Component Value Date/Time   NA 134 (L) 01/26/2022 1803   K 3.3 (L) 01/26/2022 1803   CL 94 (L) 01/26/2022 1803   CO2 22 01/26/2022 1803   GLUCOSE 76 01/26/2022 1803   BUN 27 (H) 01/26/2022 1803   CREATININE 2.58 (H) 01/26/2022 1803   CREATININE 0.88 08/03/2020 1626   CALCIUM 9.6 01/26/2022 1803   PROT 7.5 01/26/2022 1803   ALBUMIN 4.1 01/26/2022 1803   AST 27 01/26/2022 1803   ALT 21 01/26/2022 1803   ALKPHOS 46 01/26/2022 1803   BILITOT 1.1 01/26/2022 1803   GFRNONAA 19 (L) 01/26/2022 1803   GFRNONAA 65 08/03/2020 1626   GFRAA 75 08/03/2020 1626   Urinalysis    Component Value Date/Time   COLORURINE AMBER (A) 01/26/2022 1800   APPEARANCEUR HAZY (A) 01/26/2022 1800   LABSPEC 1.018 01/26/2022 1800   PHURINE 5.0 01/26/2022 1800   GLUCOSEU NEGATIVE 01/26/2022 1800   GLUCOSEU NEGATIVE 10/09/2021 1302   HGBUR NEGATIVE 01/26/2022 1800   BILIRUBINUR NEGATIVE 01/26/2022 1800   BILIRUBINUR negative 01/23/2022 1427   KETONESUR 20 (A) 01/26/2022 1800   PROTEINUR NEGATIVE 01/26/2022 1800   UROBILINOGEN 1.0 01/23/2022 1427   UROBILINOGEN 0.2 10/09/2021 1302   NITRITE NEGATIVE 01/26/2022 1800   LEUKOCYTESUR NEGATIVE 01/26/2022 1800      Assessment and Plan: * AKI (acute kidney injury) (HCC) ? Pre renal due  to poor PO intake? Creat was 2.0 on the 14th (day she started bactrim) so I dont think this is all just bactrim induced creat elevation. IVF: LR at 125 Strict intake and output Hold diuretics Hold nephrotoxic meds (diovan) Repeat BMP in AM If not rapidly improving: Consider renal US Consider nephrology consult  Acute encephalopathy Suspect mental status complaints are secondary to build up of opiates and gabapentin in system in setting of AKI.  Alcohol dependency (HCC) CIWA Giving one time of 500mg  thiamine in addition to the 100 daily.  Chronic pain disorder With chronic opiate use. Concerned about build up of opiates in system in setting of AKI today, holding home opiates. Hold gabapentin for the moment. Also pt high risk for OD at baseline given ongoing EtOH abuse.  (HFpEF) heart failure with preserved ejection fraction (HCC) Hold diuretics in setting of AKI.      Advance Care Planning:   Code Status: Full Code  Consults: None  Family Communication: No family in room  Severity of Illness: The appropriate patient status for this patient is OBSERVATION. Observation status is judged to be reasonable and necessary in order to provide the required intensity of service to ensure the patient's safety. The patient's presenting symptoms, physical exam findings, and initial radiographic and laboratory data in the context of their medical condition is felt to place them at decreased risk for further clinical deterioration. Furthermore, it is anticipated that the patient will be medically stable for discharge from the hospital within 2 midnights of admission.   Author: Hillary Bow., DO 01/27/2022 3:59 AM  For on call review [email or web address].

## 2022-01-27 NOTE — Assessment & Plan Note (Addendum)
Furosemide held on admission. Discontinued on discharge. Recommendation to resume once creatinine has stabilized. ?

## 2022-01-28 DIAGNOSIS — N179 Acute kidney failure, unspecified: Secondary | ICD-10-CM | POA: Diagnosis not present

## 2022-01-28 LAB — BASIC METABOLIC PANEL
Anion gap: 12 (ref 5–15)
BUN: 20 mg/dL (ref 8–23)
CO2: 18 mmol/L — ABNORMAL LOW (ref 22–32)
Calcium: 8.9 mg/dL (ref 8.9–10.3)
Chloride: 103 mmol/L (ref 98–111)
Creatinine, Ser: 1.99 mg/dL — ABNORMAL HIGH (ref 0.44–1.00)
GFR, Estimated: 26 mL/min — ABNORMAL LOW (ref >60)
Glucose, Bld: 77 mg/dL (ref 70–99)
Potassium: 3.2 mmol/L — ABNORMAL LOW (ref 3.5–5.1)
Sodium: 133 mmol/L — ABNORMAL LOW (ref 135–145)

## 2022-01-28 LAB — URINE CULTURE

## 2022-01-28 MED ORDER — GABAPENTIN 300 MG PO CAPS: 300.0000 mg | ORAL_CAPSULE | Freq: Two times a day (BID) | ORAL | Status: DC

## 2022-01-28 NOTE — Assessment & Plan Note (Signed)
Continue Crestor 

## 2022-01-28 NOTE — Discharge Summary (Signed)
?Physician Discharge Summary ?  ?Patient: Rebecca Baird MRN: [id number] DOB: Feb 25, 1946  ?Admit date:     01/26/2022  ?Discharge date: 01/28/22  ?Discharge Physician: Cordelia Poche, MD  ? ?PCP: [place], Betty G, MD  ? ?Recommendations at discharge:  ? ?PCP follow-up ?Repeat BMP in 3-5 days to assure continued improvement of renal function ?Adjust medication for renal function ? ?Discharge Diagnoses: ?Principal Problem: ?  AKI (acute kidney injury) ([place]) ?Active Problems: ?  Chronic pain disorder ?  Alcohol dependency ([place]) ?  (HFpEF) heart failure with preserved ejection fraction ([place]) ?  Benign essential hypertension ?  Hyperlipemia, mixed ? ?Resolved Problems: ?  Acute encephalopathy ? ? ?Assessment and Plan: ?* AKI (acute kidney injury) ([place]) ?Likely related to dehydration but patient has also recently been on Bactrim. Baseline creatinine of about 1.2 from 06/2021 and more recently 2 from 3/14. Creatinine on admission of 2.58 and is improving with IV fluids. Associated elevated anion gap metabolic acidosis. Home Lasix  and valsartan held on admission. AKI down to 1.99 on day of discharge. Medication list adjusted for current renal function. Repeat metabolic panel in 3-5 days as an outpatient is recommended. ? ?Acute encephalopathy-resolved as of 01/28/2022 ?Possibly medication related in setting of renal impairment and poor filtration. Patient is on gabapentin and tapentadol as an outpatient. Mental status appears to be resolved.  ? ?Alcohol dependency ([place]) ?CIWA while admitted. Thiamine given. ? ?Chronic pain disorder ?Tapentadol held on admission. Resumed on discharge, but may need a different regimen if patient shows evidence of chronic kidney disease. Gabapentin dose reduced for renal function but this may be resumed at previous dose once creatinine has reached baseline. ? ?(HFpEF) heart failure with preserved ejection fraction ([place]) ?Furosemide held on admission. Discontinued on discharge. Recommendation  to resume once creatinine has stabilized. ? ?Hyperlipemia, mixed ?Continue Crestor. ? ?Benign essential hypertension ?Continue Toprol XL. Resume hydrochlorothiazide on discharge. Recommendation to stop Valsartan on discharge, awaiting stabilization of renal function. ? ? ? ? ?  ? ? ?Consultants: None ?Procedures performed: None  ?Disposition: Home ?Diet recommendation:  ?Cardiac diet ?DISCHARGE MEDICATION: ?Allergies as of 01/28/2022   ? ?   Reactions  ? Epinephrine Other (See Comments), Palpitations  ? "Heart Races Uncomfortably"  ? ?  ? ?  ?Medication List  ?  ? ?STOP taking these medications   ? ?alendronate 70 MG tablet ?Commonly known as: FOSAMAX ?  ?furosemide 20 MG tablet ?Commonly known as: LASIX ?  ?sulfamethoxazole-trimethoprim 800-160 MG tablet ?Commonly known as: BACTRIM DS ?  ?valsartan 160 MG tablet ?Commonly known as: DIOVAN ?  ? ?  ? ?TAKE these medications   ? ?albuterol 108 (90 Base) MCG/ACT inhaler ?Commonly known as: VENTOLIN HFA ?Inhale 2 puffs into the lungs every 6 (six) hours as needed for wheezing or shortness of breath. ?  ?aspirin EC 81 MG tablet ?Take 81 mg by mouth daily. Swallow whole. ?  ?desonide 0.05 % cream ?Commonly known as: DESOWEN ?APPLY TO FACE TOPICALLY DAILY AS NEEDED ?What changed: See the new instructions. ?  ?docusate sodium 50 MG capsule ?Commonly known as: COLACE ?Take 50 mg by mouth 3 (three) times daily. ?  ?folic acid [id number] MCG tablet ?Commonly known as: FOLVITE ?Take 400 mcg by mouth daily. ?  ?gabapentin 300 MG capsule ?Commonly known as: NEURONTIN ?Take 1 capsule (300 mg total) by mouth 2 (two) times daily. ?What changed: See the new instructions. ?  ?hydrochlorothiazide 25 MG tablet ?Commonly known as: HYDRODIURIL ?TAKE 1 TABLET (  25 MG TOTAL) BY MOUTH DAILY. ?  ?metoprolol succinate 25 MG 24 hr tablet ?Commonly known as: TOPROL-XL ?TAKE 1 TABLET BY MOUTH EVERY DAY ?  ?omeprazole 20 MG capsule ?Commonly known as: PRILOSEC ?Take 1 capsule (20 mg total) by mouth  daily. ?  ?pyridOXINE 100 MG tablet ?Commonly known as: VITAMIN B-6 ?Take 100 mg by mouth daily. ?  ?rosuvastatin 10 MG tablet ?Commonly known as: CRESTOR ?TAKE 1 TABLET BY MOUTH EVERY DAY ?  ?tapentadol 100 MG 12 hr tablet ?Commonly known as: NUCYNTA ?Take 100 mg by mouth every 12 (twelve) hours. ?  ?thiamine 100 MG tablet ?Commonly known as: Vitamin B-1 ?Take 100 mg by mouth daily. ?  ?triamcinolone cream 0.1 % ?Commonly known as: KENALOG ?APPLY TO LEGS TOPICALLY TWICE A DAY AS NEEDED ?What changed: See the new instructions. ?  ?VITAMIN B12 PO ?Take 5,000 mcg by mouth daily. ?  ?vitamin C 1000 MG tablet ?Take 1,000 mg by mouth daily. ?  ?Vitamin D3 125 MCG (5000 UT) Caps ?Take 1 capsule by mouth daily. ?  ? ?  ? ? ?Discharge Exam: ?Danley Danker Weights  ? 01/26/22 1711  ?Weight: 85.3 kg  ? ?General exam: Appears calm and comfortable ?Respiratory system: Clear to auscultation. Respiratory effort normal. ?Cardiovascular system: S1 & S2 heard, RRR. 1/6 systolic murmur ?Gastrointestinal system: Abdomen is nondistended, soft and nontender. No organomegaly or masses felt. Normal bowel sounds heard. ?Central nervous system: Alert and oriented. No focal neurological deficits. ?Musculoskeletal: No edema. No calf tenderness ?Skin: No cyanosis. No rashes ?Psychiatry: Judgement and insight appear normal. Mood & affect appropriate.  ? ?Condition at discharge: stable ? ?The results of significant diagnostics from this hospitalization (including imaging, microbiology, ancillary and laboratory) are listed below for reference.  ? ?Imaging Studies: ?CT HEAD WO CONTRAST (5MM) ? ?Result Date: 01/26/2022 ?CLINICAL DATA:  Mental status change, unknown cause EXAM: CT HEAD WITHOUT CONTRAST TECHNIQUE: Contiguous axial images were obtained from the base of the skull through the vertex without intravenous contrast. RADIATION DOSE REDUCTION: This exam was performed according to the departmental dose-optimization program which includes automated  exposure control, adjustment of the mA and/or kV according to patient size and/or use of iterative reconstruction technique. COMPARISON:  None. BRAIN: BRAIN Cerebral ventricle sizes are concordant with the degree of cerebral volume loss. No evidence of large-territorial acute infarction. No parenchymal hemorrhage. No mass lesion. No extra-axial collection. No mass effect or midline shift. No hydrocephalus. Basilar cisterns are patent. Empty sella. Vascular: No hyperdense vessel. Atherosclerotic calcifications are present within the cavernous internal carotid arteries. Skull: No acute fracture or focal lesion. Sinuses/Orbits: Bilateral maxillary mucosal thickening. Otherwise paranasal sinuses and mastoid air cells are clear. Bilateral lens replacement. Otherwise orbits are unremarkable. Other: None. IMPRESSION: 1. No acute intracranial abnormality. 2. Empty sella. Findings is often a normal anatomic variant but can be associated with idiopathic intracranial hypertension (pseudotumor cerebri). Electronically Signed   By: Iven Finn M.D.   On: 01/26/2022 19:33  ? ?US Renal ? ?Result Date: 01/26/2022 ?CLINICAL DATA:  Acute renal failure. EXAM: RENAL / URINARY TRACT ULTRASOUND COMPLETE COMPARISON:  None. FINDINGS: Right Kidney: Renal measurements: 9.3 x 3.7 x 3.7 cm = volume: 66.52 mL. Echogenicity within normal limits. No mass or hydronephrosis visualized. Left Kidney: Renal measurements: 9.8 x 4.1 x 4.2 cm = volume: 87.44 mL. Echogenicity within normal limits. No mass or hydronephrosis visualized. Bladder: Not seen on exam. Other: None. IMPRESSION: 1. Normal evaluation of the kidneys. 2. The bladder is not  visualized on exam. Electronically Signed   By: Brett Fairy M.D.   On: 01/26/2022 23:50  ? ?DG Chest Port 1 View ? ?Result Date: 01/26/2022 ?CLINICAL DATA:  Shortness of breath EXAM: PORTABLE CHEST 1 VIEW COMPARISON:  11/08/2021 FINDINGS: The heart size and mediastinal contours are within normal limits. Both  lungs are clear. The visualized skeletal structures are unremarkable. IMPRESSION: No active disease. Electronically Signed   By: Ulyses Jarred M.D.   On: 01/26/2022 23:32   ? ?Microbiology: ?Results for orders plac

## 2022-01-28 NOTE — Assessment & Plan Note (Signed)
Continue Toprol XL. Resume hydrochlorothiazide on discharge. Recommendation to stop Valsartan on discharge, awaiting stabilization of renal function. ?

## 2022-01-28 NOTE — Progress Notes (Signed)
Discharge instructions (including medications) discussed with and copy provided to patient/caregiver. All belongings sent with patient. 

## 2022-01-28 NOTE — Discharge Instructions (Addendum)
Georgena Spurling, ? ?You were in the hospital with dehydration and confusion. You have been given IV fluids with improvement of your kidney function. I am recommending to hold a few of your medications. ? ?Please hold:  ?Valsartan (blood pressure medication). Your doctor may decide to restart this once your kidney function stabilizes. ?Alendronate (calcium/bone medication). Your doctor may decide to restart this once your kidney function stabilizes. ?Furosemide (diuretic). Your doctor may decide to restart this once your kidney function stabilizes. ?Bactrim (antibiotic) ? ?I have changed your gabapentin dose to decrease the frequency to twice daily. ? ?You will need to follow-up with your primary physician soon. I would like you to get repeat blood work in about 3-5 days, so please call her office tomorrow, (3/20). ?

## 2022-01-28 NOTE — Care Management Obs Status (Signed)
MEDICARE OBSERVATION STATUS NOTIFICATION ? ? ?Patient Details  ?Name: Rebecca Baird ?MRN: [id number] ?Date of Birth: 30-Nov-1945 ? ? ?Medicare Observation Status Notification Given:  Yes ? ? ? ?Bartholomew Crews, RN ?01/28/2022, 9:16 AM ?

## 2022-01-30 LAB — VITAMIN B1: Vitamin B1 (Thiamine): 126.9 nmol/L (ref 66.5–200.0)

## 2022-01-31 ENCOUNTER — Telehealth: Payer: Self-pay | Admitting: Family Medicine

## 2022-01-31 NOTE — Telephone Encounter (Signed)
She is supposed to have a call within 2 days of hospitalization and office follow up. ?Thanks, ?BJ ?

## 2022-01-31 NOTE — Telephone Encounter (Signed)
Patient spouse called in stating that the patient was in the hospital and they wanted her to call and request an appt for lab work from Dr.Jordan. ? ?Please advise. ?

## 2022-01-31 NOTE — Telephone Encounter (Signed)
Can you get her schedule for a hospital follow up? We can repeat the lab work at that time. Thanks! ?

## 2022-01-31 NOTE — Telephone Encounter (Signed)
Per hospital summary, they wanted bmp repeated in 3-5 days after discharge. Okay to order for lab or did you want to see her too? ?

## 2022-02-02 ENCOUNTER — Other Ambulatory Visit: Payer: Self-pay | Admitting: Family Medicine

## 2022-02-02 DIAGNOSIS — M816 Localized osteoporosis [Lequesne]: Secondary | ICD-10-CM

## 2022-02-05 NOTE — Telephone Encounter (Signed)
Lvm for patient to call back to schedule HFU. ?

## 2022-02-06 ENCOUNTER — Ambulatory Visit: Payer: Medicare PPO | Admitting: Family Medicine

## 2022-02-06 ENCOUNTER — Encounter: Payer: Self-pay | Admitting: Family Medicine

## 2022-02-06 VITALS — BP 128/80 | HR 96 | Resp 16 | Ht 65.0 in | Wt 179.0 lb

## 2022-02-06 DIAGNOSIS — N179 Acute kidney failure, unspecified: Secondary | ICD-10-CM

## 2022-02-06 DIAGNOSIS — K5900 Constipation, unspecified: Secondary | ICD-10-CM

## 2022-02-06 DIAGNOSIS — G3184 Mild cognitive impairment, so stated: Secondary | ICD-10-CM | POA: Diagnosis not present

## 2022-02-06 DIAGNOSIS — G6289 Other specified polyneuropathies: Secondary | ICD-10-CM

## 2022-02-06 DIAGNOSIS — E876 Hypokalemia: Secondary | ICD-10-CM

## 2022-02-06 DIAGNOSIS — I10 Essential (primary) hypertension: Secondary | ICD-10-CM

## 2022-02-06 DIAGNOSIS — G894 Chronic pain syndrome: Secondary | ICD-10-CM | POA: Diagnosis not present

## 2022-02-06 LAB — BASIC METABOLIC PANEL
BUN: 10 mg/dL (ref 6–23)
CO2: 28 mEq/L (ref 19–32)
Calcium: 10 mg/dL (ref 8.4–10.5)
Chloride: 100 mEq/L (ref 96–112)
Creatinine, Ser: 1.19 mg/dL (ref 0.40–1.20)
GFR: 44.66 mL/min — ABNORMAL LOW (ref >60.00)
Glucose, Bld: 111 mg/dL — ABNORMAL HIGH (ref 70–99)
Potassium: 3.4 mEq/L — ABNORMAL LOW (ref 3.5–5.1)
Sodium: 137 mEq/L (ref 135–145)

## 2022-02-06 LAB — MAGNESIUM: Magnesium: 1.7 mg/dL (ref 1.5–2.5)

## 2022-02-06 NOTE — Patient Instructions (Addendum)
A few things to remember from today's visit: ? ? ?AKI (acute kidney injury) (HCC) ? ?Hypokalemia ? ?Benign essential hypertension ? ?If you need refills please call your pharmacy. ?Do not use My Chart to request refills or for acute issues that need immediate attention. ?  ?Miralax and Senakot at bedtime every other day. ?Continue monitoring blood pressure at home. ? ?Please be sure medication list is accurate. ?If a new problem present, please set up appointment sooner than planned today. ? ? ? ? ? ? ? ?

## 2022-02-06 NOTE — Progress Notes (Signed)
? ?HPI: ?[name] is a 76 y.o. female, who is here today with her husband to follow on recent hospitalization. ?She was admitted on 01/26/2022 and discharged home on 01/28/2022. ?She was seen here in the office on 01/23/22 for confusion and symptoms suggestive of UTI. ?Started on Bactrim. ?Worsening renal function , so she was instructed to got the ER. ?No TCM call. ? ?Her husband took over medications. ? ?AKI: Most likely caused by dehydration. ?Creatinine on admission was 2.58, improved with IV fluids but not back to her baseline. ?Associated elevated anion gap metabolic acidosis. ?Lasix and valsartan were held on admission. ?Creatinine at the time of the discharge was 1.99. ? ?Renal US on 01/26/22:  ?1. Normal evaluation of the kidneys. ?2. The bladder is not visualized on exam. ?Head CT: ?1. No acute intracranial abnormality. ?2. Empty sella. Findings is often a normal anatomic variant but can be associated with idiopathic intracranial hypertension (pseudotumor cerebri). ? ?HTN on metoprolol succinate 25 mg daily. ?Still taking HCTZ 25 mg daily. ?Valsartan was held. ?Reporting BP readings at hose 120-130/70-80's. ? ?Chronic paina nd peripheral neuropathy: Tapentadol was held on admission and resume at the time of the discharge. ?Gabapentin dose was reduced during hospitalization, adjusted for e GFR.She is on Gabapentin 300 mg bid. ? ?Acute encephalopathy thought to be medication related in the setting of renal impairment. ?At the time of her discharge mental status was at her baseline. ?She has had some forgetfulness for a few years, which her husband considers as "normal" for her. ? ?HypoK+, she is not on K+ supplementation. ? ?Lab Results  ?Component Value Date  ? CREATININE 1.99 (H) 01/28/2022  ? BUN 20 01/28/2022  ? NA 133 (L) 01/28/2022  ? K 3.2 (L) 01/28/2022  ? CL 103 01/28/2022  ? CO2 18 (L) 01/28/2022  ? ?Mg++ was mildly elevated at 2.5, 01/26/22. ? ?Lab Results  ?Component Value Date  ?  WBC 8.7 01/27/2022  ? HGB 11.6 (L) 01/27/2022  ? HCT 32.9 (L) 01/27/2022  ? MCV 94.5 01/27/2022  ? PLT 218 01/27/2022  ? ?Constipation: This has been a chronic problem. 2 days ago she had a "huge" bowel movement, felt much better after. No blood in stool or melena. ?She is taking Colace, asking if she can take Miralax or Senakot. ?Mild nausea sometimes. ? ?Peripheral neuropathy and memory problems: Hands and feet numbness. She is on Gabapentin 300 mg bid, she is not sure if it is helping/ ?She has been evaluated by neuro. ?According to her husband, her MS is not yet at her baseline but improving. ?She has had forgetfulness for years. ? ?C/O back pain and hands pain, chronic generalized pain but worse in these areas  ?Follows with pain management. ?She has seen ortho for ankle and cervical pain. ?She has an appt on 03/02/22. ? ?Review of Systems  ?Constitutional:  Positive for activity change and fatigue. Negative for fever.  ?HENT:  Negative for mouth sores, nosebleeds and sore throat.   ?Respiratory:  Negative for cough, shortness of breath and wheezing.   ?Cardiovascular:  Negative for chest pain, palpitations and leg swelling.  ?Gastrointestinal:  Negative for abdominal pain and vomiting.  ?     Negative for changes in bowel habits.  ?Genitourinary:  Negative for decreased urine volume, dysuria and hematuria.  ?Musculoskeletal:  Positive for arthralgias, back pain and gait problem.  ?Skin:  Negative for rash.  ?Neurological:  Negative for syncope, facial asymmetry and headaches.  ?Psychiatric/Behavioral:  Negative  for hallucinations. The patient is nervous/anxious.   ?Rest see pertinent positives and negatives per HPI. ? ?Current Outpatient Medications on File Prior to Visit  ?Medication Sig Dispense Refill  ? albuterol (VENTOLIN HFA) 108 (90 Base) MCG/ACT inhaler Inhale 2 puffs into the lungs every 6 (six) hours as needed for wheezing or shortness of breath. 8 g 0  ? Ascorbic Acid (VITAMIN C) 1000 MG tablet Take  1,000 mg by mouth daily.    ? aspirin EC 81 MG tablet Take 81 mg by mouth daily. Swallow whole.    ? Cholecalciferol (VITAMIN D3) 125 MCG (5000 UT) CAPS Take 1 capsule by mouth daily.    ? Cyanocobalamin (VITAMIN B12 PO) Take 5,000 mcg by mouth daily.    ? desonide (DESOWEN) 0.05 % cream APPLY TO FACE TOPICALLY DAILY AS NEEDED (Patient taking differently: Apply 1 application. topically daily as needed (rash on face).) 30 g 1  ? docusate sodium (COLACE) 50 MG capsule Take 50 mg by mouth 3 (three) times daily.    ? folic acid (FOLVITE) 800 MCG tablet Take 400 mcg by mouth daily.    ? gabapentin (NEURONTIN) 300 MG capsule Take 1 capsule (300 mg total) by mouth 2 (two) times daily.    ? hydrochlorothiazide (HYDRODIURIL) 25 MG tablet TAKE 1 TABLET (25 MG TOTAL) BY MOUTH DAILY. 90 tablet 1  ? metoprolol succinate (TOPROL-XL) 25 MG 24 hr tablet TAKE 1 TABLET BY MOUTH EVERY DAY (Patient taking differently: Take 25 mg by mouth daily.) 90 tablet 2  ? omeprazole (PRILOSEC) 20 MG capsule Take 1 capsule (20 mg total) by mouth daily. 90 capsule 2  ? pyridOXINE (VITAMIN B-6) 100 MG tablet Take 100 mg by mouth daily.    ? rosuvastatin (CRESTOR) 10 MG tablet TAKE 1 TABLET BY MOUTH EVERY DAY (Patient taking differently: Take 10 mg by mouth daily.) 90 tablet 2  ? tapentadol (NUCYNTA) 100 MG 12 hr tablet Take 100 mg by mouth every 12 (twelve) hours.    ? thiamine (VITAMIN B-1) 100 MG tablet Take 100 mg by mouth daily.    ? triamcinolone cream (KENALOG) 0.1 % APPLY TO LEGS TOPICALLY TWICE A DAY AS NEEDED (Patient taking differently: Apply 1 application. topically 2 (two) times daily as needed (rash on legs).) 30 g 2  ? ?No current facility-administered medications on file prior to visit.  ? ?Past Medical History:  ?Diagnosis Date  ? Anxiety   ? Depression   ? GERD (gastroesophageal reflux disease)   ? Hypertension   ? Peripheral neuropathy   ? ?Allergies  ?Allergen Reactions  ? Epinephrine Other (See Comments) and Palpitations  ?   "Heart Races Uncomfortably" ?  ? ?Social History  ? ?Socioeconomic History  ? Marital status: Married  ?  Spouse name: Not on file  ? Number of children: 1  ? Years of education: Not on file  ? Highest education level: Associate degree: academic program  ?Occupational History  ? Occupation: retired  ?Tobacco Use  ? Smoking status: Former  ?  Types: Cigarettes  ? Smokeless tobacco: Never  ?Vaping Use  ? Vaping Use: Never used  ?Substance and Sexual Activity  ? Alcohol use: Yes  ?  Alcohol/week: 3.0 standard drinks  ?  Types: 3 Glasses of wine per week  ?  Comment: Drinks Wine  ? Drug use: Yes  ?  Types: Other-see comments  ?  Comment: nucytha  ? Sexual activity: Not on file  ?Other Topics Concern  ? Not  on file  ?Social History Narrative  ? Right handed  ? One story home  ? Drinks occasional caffeine  ? ?Social Determinants of Health  ? ?Financial Resource Strain: Low Risk   ? Difficulty of Paying Living Expenses: Not hard at all  ?Food Insecurity: No Food Insecurity  ? Worried About Programme researcher, broadcasting/film/videounning Out of Food in the Last Year: Never true  ? Ran Out of Food in the Last Year: Never true  ?Transportation Needs: No Transportation Needs  ? Lack of Transportation (Medical): No  ? Lack of Transportation (Non-Medical): No  ?Physical Activity: Insufficiently Active  ? Days of Exercise per Week: 2 days  ? Minutes of Exercise per Session: 40 min  ?Stress: Stress Concern Present  ? Feeling of Stress : To some extent  ?Social Connections: Not on file  ? ?Vitals:  ? 02/06/22 0954  ?BP: 128/80  ?Pulse: 96  ?Resp: 16  ?SpO2: 97%  ? ?Body mass index is 29.79 kg/m?. ? ?Physical Exam ?Vitals and nursing note reviewed.  ?Constitutional:   ?   General: She is not in acute distress. ?   Appearance: She is well-developed.  ?HENT:  ?   Head: Normocephalic and atraumatic.  ?   Mouth/Throat:  ?   Mouth: Mucous membranes are moist.  ?   Pharynx: Oropharynx is clear.  ?Eyes:  ?   Conjunctiva/sclera: Conjunctivae normal.  ?Cardiovascular:  ?   Rate  and Rhythm: Normal rate and regular rhythm.  ?   Heart sounds: Murmur (SEM I/VI RUSB-LUSB) heard.  ?   Comments: DP pulses present. ?Pulmonary:  ?   Effort: Pulmonary effort is normal. No respiratory distress.

## 2022-02-08 ENCOUNTER — Other Ambulatory Visit: Payer: Self-pay | Admitting: Family Medicine

## 2022-02-08 MED ORDER — POTASSIUM CHLORIDE CRYS ER 20 MEQ PO TBCR: 20.0000 meq | EXTENDED_RELEASE_TABLET | Freq: Every day | ORAL | 1 refills | Status: DC

## 2022-02-12 ENCOUNTER — Telehealth: Payer: Self-pay | Admitting: Family Medicine

## 2022-02-12 NOTE — Telephone Encounter (Signed)
I called and spoke with patient. Per last office note, patient is to continue Metoprolol and HCTZ and monitor BP. Patient is aware and will continue medications. She will let us know if the BP starts to go back up.  ?

## 2022-02-12 NOTE — Telephone Encounter (Signed)
Patient is confused about medication. Patient stated that the hospital took her off her BP medication and she believes that she should still be taking it. Patient wants to know what should she do. ? ?Please advise. ? ?

## 2022-02-26 DIAGNOSIS — G8929 Other chronic pain: Secondary | ICD-10-CM | POA: Diagnosis not present

## 2022-02-26 DIAGNOSIS — Z79899 Other long term (current) drug therapy: Secondary | ICD-10-CM | POA: Diagnosis not present

## 2022-02-26 DIAGNOSIS — M5416 Radiculopathy, lumbar region: Secondary | ICD-10-CM | POA: Diagnosis not present

## 2022-03-08 ENCOUNTER — Inpatient Hospital Stay: Admission: RE | Admit: 2022-03-08 | Payer: Medicare PPO | Source: Ambulatory Visit

## 2022-03-09 NOTE — Progress Notes (Signed)
? ?HPI: ?[name] is a 76 y.o. female, who is here today for follow up. ?She was last seen on 02/06/22 for hospital follow up. ?Today she has a list of concerns. ?Problems with memory getting worse. She has difficulty remembering her zip code and names. ?We have discussed this problem a few times in the past. ?Last visit her husband reported that she was at her baseline, she has had some "normal" forgetfulness for a while. Problem was worse before recent hospitalization, attributed to UTI and AKI. ? ?Concerned about possible diabetes. ?She has had some elevated glucose. ?Negative for polydipsia,polyuria, or polyphagia. ?Last visit HgA1C was mildly elevated. ?Lab Results  ?Component Value Date  ? HGBA1C 5.8 01/23/2022  ? ?HTN: She is on HCTZ 25 mg daily and Metoprolol succinate 25 mg daily. ?She is not checking BP at home. ?Negative for severe/frequent headache, visual changes, chest pain, dyspnea, focal weakness, or worsening edema. ?Her BP was 85/51. ? ?Lab Results  ?Component Value Date  ? CREATININE 1.19 02/06/2022  ? BUN 10 02/06/2022  ? NA 137 02/06/2022  ? K 3.4 (L) 02/06/2022  ? CL 100 02/06/2022  ? CO2 28 02/06/2022  ? ?Chronic pain and peripheral neuropathy: Since her last visit she has seen neurologist, Gabapentin 300 mg bid.States that she would like for one provider to prescribed her gabapentin and Nucynta. She takes all her meds in the morning, so she is not sure which medication is helping. Around 2 pm she starts feeling the pain getting worse again. She would like to have an extra dose of "pain medication" to take in the middle of the day. ?She would like a different neurologist, another provider was recommended by PT, she does not recall name. ? ?Still drinking wine a few times per week, sometimes she drinks more than 2 glasses but she states that is not concerned about this. ? ? ?  01/23/2022  ?  2:28 PM 11/27/2021  ? 11:36 AM 11/08/2021  ?  2:25 PM 10/09/2021  ? 11:54 AM 11/17/2020  ?   3:05 PM  ?Depression screen PHQ 2/9  ?Decreased Interest 0 0 0 1 1  ?Down, Depressed, Hopeless 0 0 0 1 1  ?PHQ - 2 Score 0 0 0 2 2  ?Altered sleeping 0   0 1  ?Tired, decreased energy 0   3 1  ?Change in appetite 0   0 2  ?Feeling bad or failure about yourself  0   1 1  ?Trouble concentrating 0   0 0  ?Moving slowly or fidgety/restless 0   1 0  ?Suicidal thoughts 0   0 0  ?PHQ-9 Score 0   7 7  ?Difficult doing work/chores Not difficult at all    Not difficult at all  ? ?Review of Systems  ?Constitutional:  Positive for fatigue. Negative for activity change, appetite change and fever.  ?HENT:  Negative for mouth sores, nosebleeds and trouble swallowing.   ?Respiratory:  Negative for cough and wheezing.   ?Gastrointestinal:  Negative for abdominal pain, nausea and vomiting.  ?     Negative for changes in bowel habits.  ?Genitourinary:  Negative for decreased urine volume, dysuria and hematuria.  ?Musculoskeletal:  Positive for arthralgias, back pain and gait problem.  ?Skin:  Negative for rash.  ?Neurological:  Positive for numbness. Negative for syncope and facial asymmetry.  ?Psychiatric/Behavioral:  Negative for hallucinations. The patient is nervous/anxious.   ?Rest see pertinent positives and negatives per HPI. ? ?Current  Outpatient Medications on File Prior to Visit  ?Medication Sig Dispense Refill  ? albuterol (VENTOLIN HFA) 108 (90 Base) MCG/ACT inhaler Inhale 2 puffs into the lungs every 6 (six) hours as needed for wheezing or shortness of breath. 8 g 0  ? Ascorbic Acid (VITAMIN C) 1000 MG tablet Take 1,000 mg by mouth daily.    ? aspirin EC 81 MG tablet Take 81 mg by mouth daily. Swallow whole.    ? Cholecalciferol (VITAMIN D3) 125 MCG (5000 UT) CAPS Take 1 capsule by mouth daily.    ? Cyanocobalamin (VITAMIN B12 PO) Take 5,000 mcg by mouth daily.    ? desonide (DESOWEN) 0.05 % cream APPLY TO FACE TOPICALLY DAILY AS NEEDED (Patient taking differently: Apply 1 application. topically daily as needed (rash on  face).) 30 g 1  ? docusate sodium (COLACE) 50 MG capsule Take 50 mg by mouth 3 (three) times daily.    ? folic acid (FOLVITE) [id number] MCG tablet Take 400 mcg by mouth daily.    ? gabapentin (NEURONTIN) 300 MG capsule Take 1 capsule (300 mg total) by mouth 2 (two) times daily.    ? hydrochlorothiazide (HYDRODIURIL) 25 MG tablet TAKE 1 TABLET (25 MG TOTAL) BY MOUTH DAILY. 90 tablet 1  ? metoprolol succinate (TOPROL-XL) 25 MG 24 hr tablet TAKE 1 TABLET BY MOUTH EVERY DAY (Patient taking differently: Take 25 mg by mouth daily.) 90 tablet 2  ? omeprazole (PRILOSEC) 20 MG capsule Take 1 capsule (20 mg total) by mouth daily. 90 capsule 2  ? potassium chloride SA (KLOR-CON M) 20 MEQ tablet Take 1 tablet (20 mEq total) by mouth daily. 30 tablet 1  ? pyridOXINE (VITAMIN B-6) 100 MG tablet Take 100 mg by mouth daily.    ? rosuvastatin (CRESTOR) 10 MG tablet TAKE 1 TABLET BY MOUTH EVERY DAY (Patient taking differently: Take 10 mg by mouth daily.) 90 tablet 2  ? tapentadol (NUCYNTA) 100 MG 12 hr tablet Take 100 mg by mouth every 12 (twelve) hours.    ? thiamine (VITAMIN B-1) 100 MG tablet Take 100 mg by mouth daily.    ? triamcinolone cream (KENALOG) 0.1 % APPLY TO LEGS TOPICALLY TWICE A DAY AS NEEDED (Patient taking differently: Apply 1 application. topically 2 (two) times daily as needed (rash on legs).) 30 g 2  ? ?No current facility-administered medications on file prior to visit.  ? ? ?Past Medical History:  ?Diagnosis Date  ? Anxiety   ? Depression   ? GERD (gastroesophageal reflux disease)   ? Hypertension   ? Peripheral neuropathy   ? ?Allergies  ?Allergen Reactions  ? Epinephrine Other (See Comments) and Palpitations  ?  "Heart Races Uncomfortably" ?  ? ? ?Social History  ? ?Socioeconomic History  ? Marital status: Married  ?  Spouse name: Not on file  ? Number of children: 1  ? Years of education: Not on file  ? Highest education level: Associate degree: academic program  ?Occupational History  ? Occupation: retired   ?Tobacco Use  ? Smoking status: Former  ?  Types: Cigarettes  ? Smokeless tobacco: Never  ?Vaping Use  ? Vaping Use: Never used  ?Substance and Sexual Activity  ? Alcohol use: Yes  ?  Alcohol/week: 3.0 standard drinks  ?  Types: 3 Glasses of wine per week  ?  Comment: Drinks Wine  ? Drug use: Yes  ?  Types: Other-see comments  ?  Comment: nucytha  ? Sexual activity: Not on file  ?  Other Topics Concern  ? Not on file  ?Social History Narrative  ? Right handed  ? One story home  ? Drinks occasional caffeine  ? ?Social Determinants of Health  ? ?Financial Resource Strain: Low Risk   ? Difficulty of Paying Living Expenses: Not hard at all  ?Food Insecurity: No Food Insecurity  ? Worried About Charity fundraiser in the Last Year: Never true  ? Ran Out of Food in the Last Year: Never true  ?Transportation Needs: No Transportation Needs  ? Lack of Transportation (Medical): No  ? Lack of Transportation (Non-Medical): No  ?Physical Activity: Insufficiently Active  ? Days of Exercise per Week: 2 days  ? Minutes of Exercise per Session: 40 min  ?Stress: Stress Concern Present  ? Feeling of Stress : To some extent  ?Social Connections: Not on file  ? ? ?Vitals:  ? 03/12/22 1426  ?BP: 118/70  ?Pulse: 98  ?Resp: 16  ?SpO2: 90%  ? ?Body mass index is 29.35 kg/m?. ? ?Physical Exam ?Vitals and nursing note reviewed.  ?Constitutional:   ?   General: She is not in acute distress. ?   Appearance: She is well-developed.  ?HENT:  ?   Head: Normocephalic and atraumatic.  ?   Mouth/Throat:  ?   Mouth: Mucous membranes are moist.  ?Eyes:  ?   Conjunctiva/sclera: Conjunctivae normal.  ?Cardiovascular:  ?   Rate and Rhythm: Normal rate and regular rhythm.  ?   Heart sounds: Murmur (SEM I/VI RUSB-LUSB) heard.  ?Pulmonary:  ?   Effort: Pulmonary effort is normal. No respiratory distress.  ?   Breath sounds: Normal breath sounds.  ?Abdominal:  ?   Palpations: Abdomen is soft. There is no mass.  ?   Tenderness: There is no abdominal tenderness.   ?Musculoskeletal:  ?   Right lower leg: 1+ Pitting Edema present.  ?   Left lower leg: 1+ Pitting Edema present.  ?Lymphadenopathy:  ?   Cervical: No cervical adenopathy.  ?Skin: ?   General: Skin is warm.

## 2022-03-12 ENCOUNTER — Ambulatory Visit: Payer: Medicare PPO | Admitting: Family Medicine

## 2022-03-12 ENCOUNTER — Encounter: Payer: Self-pay | Admitting: Family Medicine

## 2022-03-12 VITALS — BP 118/70 | HR 98 | Resp 16 | Ht 65.0 in | Wt 176.4 lb

## 2022-03-12 DIAGNOSIS — N179 Acute kidney failure, unspecified: Secondary | ICD-10-CM | POA: Diagnosis not present

## 2022-03-12 DIAGNOSIS — K5900 Constipation, unspecified: Secondary | ICD-10-CM

## 2022-03-12 DIAGNOSIS — G894 Chronic pain syndrome: Secondary | ICD-10-CM | POA: Diagnosis not present

## 2022-03-12 DIAGNOSIS — G6289 Other specified polyneuropathies: Secondary | ICD-10-CM

## 2022-03-12 DIAGNOSIS — R413 Other amnesia: Secondary | ICD-10-CM | POA: Diagnosis not present

## 2022-03-12 DIAGNOSIS — I10 Essential (primary) hypertension: Secondary | ICD-10-CM | POA: Diagnosis not present

## 2022-03-12 DIAGNOSIS — E876 Hypokalemia: Secondary | ICD-10-CM | POA: Diagnosis not present

## 2022-03-12 MED ORDER — BISACODYL 10 MG RE SUPP: 10.0000 mg | RECTAL | 1 refills | Status: DC | PRN

## 2022-03-12 NOTE — Assessment & Plan Note (Signed)
Mild. ?Continue KLOR 20 meq daily. ?We discussed some side effects of HCTZ. ?BMP ordered today. ? ?

## 2022-03-12 NOTE — Patient Instructions (Addendum)
A few things to remember from today's visit: ? ?Benign essential hypertension - Plan: Basic metabolic panel ? ?Hypokalemia - Plan: Basic metabolic panel ? ?Constipation, unspecified constipation type - Plan: bisacodyl (DULCOLAX) 10 MG suppository ? ?Memory problem - Plan: Neuropsychological assessment ? ?Chronic pain disorder ? ?Other polyneuropathy ? ?If you need refills please call your pharmacy. ?Do not use My Chart to request refills or for acute issues that need immediate attention. ?  ?Changed morning Gabapentin at noon and night. ?Try Bisacodyl suppository in the morning. ?I like Miralax at night every 1-2 day. ?Fiber and fluid intake. ?No changes in Gabapentin. ? ?Please be sure medication list is accurate. ?If a new problem present, please set up appointment sooner than planned today. ? ? ? ? ? ? ? ?

## 2022-03-12 NOTE — Assessment & Plan Note (Signed)
Educated about the importance of good foot/skin care. ?Instructed to check LE/feet skin regularly. ?Continue Gabapentin 300 mg bid. ?She would like to establish with a different neuro,will clal and let me know name of neuro she would like to see. ?

## 2022-03-12 NOTE — Assessment & Plan Note (Signed)
This is a chronic problem. ?Recommend changing from colace to miralax at bedtime q 1-2 days. ?Bisacodyl suppositories in the morning started today. ?We discussed some side effects of opioid treatment. ?Continue adequate fiber ans fluid intake. ?Last colonoscopy in 2017, 10 years f/u was recommended. ? ?

## 2022-03-12 NOTE — Assessment & Plan Note (Addendum)
Today BP adequately controlled but mild hypotension during recent neuro visit. ?Recommend monitoring BP at home. ?Continue Metoprolol succinate 25 mg daily and HCTZ 25 mg daily. ?Continue low salt diet. ? ?

## 2022-03-12 NOTE — Assessment & Plan Note (Signed)
C/O being in pain all the time, so problem does not seem to be well controlled. ?We discussed reasonable expectations in regard to pain goals. ? ?Explained that neurologist usually does not prescribe opioids, so she needs to continue following with pain clinic. ?She does not want referral to pain management closer home. ?

## 2022-03-13 ENCOUNTER — Other Ambulatory Visit: Payer: Self-pay | Admitting: Family Medicine

## 2022-03-13 ENCOUNTER — Telehealth: Payer: Self-pay

## 2022-03-13 DIAGNOSIS — E876 Hypokalemia: Secondary | ICD-10-CM

## 2022-03-13 DIAGNOSIS — M816 Localized osteoporosis [Lequesne]: Secondary | ICD-10-CM

## 2022-03-13 DIAGNOSIS — G6289 Other specified polyneuropathies: Secondary | ICD-10-CM

## 2022-03-13 LAB — BASIC METABOLIC PANEL
BUN: 13 mg/dL (ref 6–23)
CO2: 34 mEq/L — ABNORMAL HIGH (ref 19–32)
Calcium: 9.4 mg/dL (ref 8.4–10.5)
Chloride: 87 mEq/L — ABNORMAL LOW (ref 96–112)
Creatinine, Ser: 1.53 mg/dL — ABNORMAL HIGH (ref 0.40–1.20)
GFR: 33.01 mL/min — ABNORMAL LOW (ref >60.00)
Glucose, Bld: 94 mg/dL (ref 70–99)
Potassium: 2.8 mEq/L — CL (ref 3.5–5.1)
Sodium: 131 mEq/L — ABNORMAL LOW (ref 135–145)

## 2022-03-13 NOTE — Addendum Note (Signed)
Addended by: Weyman Croon E on: 03/13/2022 12:00 PM ? ? Modules accepted: Orders ? ?

## 2022-03-13 NOTE — Telephone Encounter (Signed)
CRITICAL VALUE STICKER ? ?CRITICAL VALUE: Potassium 2.8 ? ?RECEIVER (on-site recipient of call): lab ? ?DATE & TIME NOTIFIED: 03/13/22 11:20 ? ?MESSENGER (representative from lab): lab ? ?MD NOTIFIED: Yes ? ?TIME OF NOTIFICATION: 03/13/22 11:22 ? ?RESPONSE: route via inbasket.  ? ?

## 2022-03-13 NOTE — Telephone Encounter (Signed)
Informed patient of results and patient verbalized understanding. ?Patient has been cutting the potassium tablets into 4ths to take the medicine. She will try to take it twice a day; she will stop the HCTZ and monitor her BP; she is coming in 5/8 at 1pm for a potassium recheck.  ?

## 2022-03-13 NOTE — Telephone Encounter (Signed)
She reported taking KLOR but please verify she is doing so.She needs to increase KLOR 20 meq from qd to bid x 7 days then back to daily. ?Stop HCTZ. ?Continue monitoring BP. ?K+ in  8-10 days. ?Thanks, ?BJ ?

## 2022-03-14 ENCOUNTER — Other Ambulatory Visit: Payer: Self-pay | Admitting: Family Medicine

## 2022-03-14 DIAGNOSIS — E876 Hypokalemia: Secondary | ICD-10-CM

## 2022-03-15 ENCOUNTER — Ambulatory Visit
Admission: RE | Admit: 2022-03-15 | Discharge: 2022-03-15 | Disposition: A | Discharge status: Home / Self Care | Payer: Medicare PPO | Source: Ambulatory Visit | Attending: Family Medicine | Admitting: Family Medicine

## 2022-03-15 DIAGNOSIS — Z1231 Encounter for screening mammogram for malignant neoplasm of breast: Secondary | ICD-10-CM

## 2022-03-19 ENCOUNTER — Other Ambulatory Visit: Payer: Medicare PPO

## 2022-03-19 ENCOUNTER — Other Ambulatory Visit (INDEPENDENT_AMBULATORY_CARE_PROVIDER_SITE_OTHER): Payer: Medicare PPO

## 2022-03-19 DIAGNOSIS — N179 Acute kidney failure, unspecified: Secondary | ICD-10-CM

## 2022-03-19 DIAGNOSIS — E876 Hypokalemia: Secondary | ICD-10-CM

## 2022-03-19 LAB — BASIC METABOLIC PANEL
BUN: 9 mg/dL (ref 6–23)
CO2: 29 mEq/L (ref 19–32)
Calcium: 9.2 mg/dL (ref 8.4–10.5)
Chloride: 99 mEq/L (ref 96–112)
Creatinine, Ser: 1.44 mg/dL — ABNORMAL HIGH (ref 0.40–1.20)
GFR: 35.5 mL/min — ABNORMAL LOW (ref >60.00)
Glucose, Bld: 91 mg/dL (ref 70–99)
Potassium: 4.2 mEq/L (ref 3.5–5.1)
Sodium: 136 mEq/L (ref 135–145)

## 2022-03-19 LAB — MICROALBUMIN / CREATININE URINE RATIO
Creatinine,U: 39.8 mg/dL
Microalb Creat Ratio: 1.8 mg/g (ref 0.0–30.0)
Microalb, Ur: <0.7 mg/dL (ref 0.0–1.9)

## 2022-03-21 ENCOUNTER — Other Ambulatory Visit: Payer: Self-pay | Admitting: Family Medicine

## 2022-04-13 ENCOUNTER — Other Ambulatory Visit: Payer: Self-pay | Admitting: Family Medicine

## 2022-04-13 DIAGNOSIS — G6289 Other specified polyneuropathies: Secondary | ICD-10-CM

## 2022-04-19 ENCOUNTER — Other Ambulatory Visit: Payer: Self-pay | Admitting: Family Medicine

## 2022-04-19 DIAGNOSIS — I10 Essential (primary) hypertension: Secondary | ICD-10-CM

## 2022-05-02 ENCOUNTER — Other Ambulatory Visit: Payer: Self-pay | Admitting: Family Medicine

## 2022-05-02 DIAGNOSIS — E876 Hypokalemia: Secondary | ICD-10-CM

## 2022-05-03 ENCOUNTER — Other Ambulatory Visit: Payer: Self-pay | Admitting: Family Medicine

## 2022-05-03 DIAGNOSIS — M5416 Radiculopathy, lumbar region: Secondary | ICD-10-CM | POA: Diagnosis not present

## 2022-05-03 DIAGNOSIS — R600 Localized edema: Secondary | ICD-10-CM

## 2022-05-03 DIAGNOSIS — G8929 Other chronic pain: Secondary | ICD-10-CM | POA: Diagnosis not present

## 2022-05-17 ENCOUNTER — Other Ambulatory Visit: Payer: Self-pay | Admitting: Family Medicine

## 2022-05-17 DIAGNOSIS — R600 Localized edema: Secondary | ICD-10-CM

## 2022-05-21 ENCOUNTER — Other Ambulatory Visit: Payer: Self-pay | Admitting: Family Medicine

## 2022-05-21 ENCOUNTER — Telehealth: Payer: Self-pay | Admitting: Family Medicine

## 2022-05-21 DIAGNOSIS — R600 Localized edema: Secondary | ICD-10-CM

## 2022-05-21 NOTE — Telephone Encounter (Signed)
Pt was calling to request a refill of her blood pressure medication. Pt states she is almost out and needs a refill as soon as possible.  Furosemide 20mg   Please send to: [id number] , [place] - [address] Phone:  [phone]  Fax:  [phone]

## 2022-05-22 NOTE — Telephone Encounter (Signed)
Can we get her set up for a follow up appointment? We need to recheck labs.

## 2022-05-23 NOTE — Telephone Encounter (Signed)
Pt scheduled for 05/25/22 @ 2:30 pm

## 2022-05-23 NOTE — Progress Notes (Unsigned)
HPI: Rebecca Baird is a 76 y.o. female with history of hypertension, anxiety, chronic pain on opioid treatment, peripheral neuropathy, unstable gait, hyperlipidemia, and alcohol dependency here today for follow up. He was last seen on 03/12/2022. Hospitalization in 01/2022 for AKI. HCTZ 25 mg was discontinued because hypokalemia, on 03/12/2022 it was 2.8 (3.4). She is on K-Lor 10 mK daily.  She is currently on metoprolol succinate 25 mg daily.  BP readings at home:*** Side effects:***  Negative for unusual or severe headache, visual changes, exertional chest pain, dyspnea,  focal weakness, or edema.  Lab Results  Component Value Date   CREATININE 1.44 (H) 03/19/2022   BUN 9 03/19/2022   NA 136 03/19/2022   K 4.2 03/19/2022   CL 99 03/19/2022   CO2 29 03/19/2022   Peripheral neuropathy: Currently she is on gabapentin 300 mg twice daily, which was initially started by neurologist.She states that she has been receiving Gabapentin 100 mg ***  Review of Systems  Constitutional:  Negative for activity change, appetite change, fatigue, fever and unexpected weight change.  HENT:  Negative for mouth sores, nosebleeds and trouble swallowing.   Eyes:  Negative for redness and visual disturbance.  Respiratory:  Negative for cough, shortness of breath and wheezing.   Cardiovascular:  Negative for chest pain, palpitations and leg swelling.  Gastrointestinal:  Negative for abdominal pain, nausea and vomiting.       Negative for changes in bowel habits.  Genitourinary:  Negative for decreased urine volume, difficulty urinating, dysuria and hematuria.  Neurological:  Negative for seizures, syncope, weakness, numbness and headaches.  Psychiatric/Behavioral: Negative.    Rest see pertinent positives and negatives per HPI.  Current Outpatient Medications on File Prior to Visit  Medication Sig Dispense Refill   albuterol (VENTOLIN HFA) 108 (90 Base) MCG/ACT inhaler Inhale 2 puffs into  the lungs every 6 (six) hours as needed for wheezing or shortness of breath. 8 g 0   alendronate (FOSAMAX) 70 MG tablet TAKE 1 TABLET BY MOUTH EVERY WEEK. TAKE WITH A FULL GLASS OF WATER ON AN EMPTY STOMACH. 12 tablet 0   Ascorbic Acid (VITAMIN C) 1000 MG tablet Take 1,000 mg by mouth daily.     aspirin EC 81 MG tablet Take 81 mg by mouth daily. Swallow whole.     bisacodyl (DULCOLAX) 10 MG suppository Place 1 suppository (10 mg total) rectally as needed for moderate constipation. 12 suppository 1   Cholecalciferol (VITAMIN D3) 125 MCG (5000 UT) CAPS Take 1 capsule by mouth daily.     Cyanocobalamin (VITAMIN B12 PO) Take 5,000 mcg by mouth daily.     desonide (DESOWEN) 0.05 % cream APPLY TO FACE TOPICALLY DAILY AS NEEDED (Patient taking differently: Apply 1 application. topically daily as needed (rash on face).) 30 g 1   docusate sodium (COLACE) 50 MG capsule Take 50 mg by mouth 3 (three) times daily.     folic acid (FOLVITE) 800 MCG tablet Take 400 mcg by mouth daily.     gabapentin (NEURONTIN) 300 MG capsule TAKE 1 CAPSULE BY MOUTH TWICE A DAY 60 capsule 0   KLOR-CON M20 20 MEQ tablet TAKE 1 TABLET BY MOUTH EVERY DAY 30 tablet 1   metoprolol succinate (TOPROL-XL) 25 MG 24 hr tablet Take 1 tablet (25 mg total) by mouth daily. 90 tablet 1   omeprazole (PRILOSEC) 20 MG capsule Take 1 capsule (20 mg total) by mouth daily. 90 capsule 1   pyridOXINE (VITAMIN B-6) 100 MG tablet  Take 100 mg by mouth daily.     rosuvastatin (CRESTOR) 10 MG tablet TAKE 1 TABLET BY MOUTH EVERY DAY (Patient taking differently: Take 10 mg by mouth daily.) 90 tablet 2   tapentadol (NUCYNTA) 100 MG 12 hr tablet Take 100 mg by mouth every 12 (twelve) hours.     thiamine (VITAMIN B-1) 100 MG tablet Take 100 mg by mouth daily.     triamcinolone cream (KENALOG) 0.1 % APPLY TO LEGS TOPICALLY TWICE A DAY AS NEEDED (Patient taking differently: Apply 1 application. topically 2 (two) times daily as needed (rash on legs).) 30 g 2   No  current facility-administered medications on file prior to visit.    Past Medical History:  Diagnosis Date   Anxiety    Depression    GERD (gastroesophageal reflux disease)    Hypertension    Peripheral neuropathy    Allergies  Allergen Reactions   Epinephrine Other (See Comments) and Palpitations    "Heart Races Uncomfortably"     Social History   Socioeconomic History   Marital status: Married    Spouse name: Not on file   Number of children: 1   Years of education: Not on file   Highest education level: Associate degree: academic program  Occupational History   Occupation: retired  Tobacco Use   Smoking status: Former    Types: Cigarettes   Smokeless tobacco: Never  Vaping Use   Vaping Use: Never used  Substance and Sexual Activity   Alcohol use: Yes    Alcohol/week: 3.0 standard drinks of alcohol    Types: 3 Glasses of wine per week    Comment: Drinks Wine   Drug use: Yes    Types: Other-see comments    Comment: nucytha   Sexual activity: Not on file  Other Topics Concern   Not on file  Social History Narrative   Right handed   One story home   Drinks occasional caffeine   Social Determinants of Health   Financial Resource Strain: Low Risk  (11/27/2021)   Overall Financial Resource Strain (CARDIA)    Difficulty of Paying Living Expenses: Not hard at all  Food Insecurity: No Food Insecurity (11/27/2021)   Hunger Vital Sign    Worried About Running Out of Food in the Last Year: Never true    Ran Out of Food in the Last Year: Never true  Transportation Needs: No Transportation Needs (11/27/2021)   PRAPARE - Administrator, Civil Service (Medical): No    Lack of Transportation (Non-Medical): No  Physical Activity: Insufficiently Active (11/27/2021)   Exercise Vital Sign    Days of Exercise per Week: 2 days    Minutes of Exercise per Session: 40 min  Stress: Stress Concern Present (11/27/2021)   [employer] of Occupational Health -  Occupational Stress Questionnaire    Feeling of Stress : To some extent  Social Connections: Moderately Isolated (11/17/2020)   Social Connection and Isolation Panel [NHANES]    Frequency of Communication with Friends and Family: More than three times a week    Frequency of Social Gatherings with Friends and Family: Never    Attends Religious Services: Never    Database administrator or Organizations: No    Attends Banker Meetings: Never    Marital Status: Married    There were no vitals filed for this visit. There is no height or weight on file to calculate BMI.  Physical Exam Vitals and nursing note reviewed.  Constitutional:      General: She is not in acute distress.    Appearance: She is well-developed.  HENT:     Head: Normocephalic and atraumatic.     Mouth/Throat:     Mouth: Mucous membranes are moist.     Pharynx: Oropharynx is clear.  Eyes:     Conjunctiva/sclera: Conjunctivae normal.  Cardiovascular:     Rate and Rhythm: Normal rate and regular rhythm.     Pulses:          Dorsalis pedis pulses are 2+ on the right side and 2+ on the left side.     Heart sounds: No murmur heard. Pulmonary:     Effort: Pulmonary effort is normal. No respiratory distress.     Breath sounds: Normal breath sounds.  Abdominal:     Palpations: Abdomen is soft. There is no hepatomegaly or mass.     Tenderness: There is no abdominal tenderness.  Lymphadenopathy:     Cervical: No cervical adenopathy.  Skin:    General: Skin is warm.     Findings: No erythema or rash.  Neurological:     General: No focal deficit present.     Mental Status: She is alert and oriented to person, place, and time.     Cranial Nerves: No cranial nerve deficit.     Gait: Gait normal.  Psychiatric:     Comments: Well groomed, good eye contact.     ASSESSMENT AND PLAN:   There are no diagnoses linked to this encounter.  No orders of the defined types were placed in this encounter.   No  problem-specific Assessment & Plan notes found for this encounter.   No follow-ups on file.   Etola Mull G. [place], MD  [facility]. [place] office.

## 2022-05-25 ENCOUNTER — Other Ambulatory Visit: Payer: Self-pay | Admitting: Family Medicine

## 2022-05-25 ENCOUNTER — Ambulatory Visit: Payer: Medicare PPO | Admitting: Family Medicine

## 2022-05-25 ENCOUNTER — Encounter: Payer: Self-pay | Admitting: Family Medicine

## 2022-05-25 VITALS — BP 142/74 | HR 90 | Resp 16 | Ht 65.0 in | Wt 177.1 lb

## 2022-05-25 DIAGNOSIS — G6289 Other specified polyneuropathies: Secondary | ICD-10-CM | POA: Diagnosis not present

## 2022-05-25 DIAGNOSIS — N1832 Chronic kidney disease, stage 3b: Secondary | ICD-10-CM | POA: Diagnosis not present

## 2022-05-25 DIAGNOSIS — E876 Hypokalemia: Secondary | ICD-10-CM

## 2022-05-25 DIAGNOSIS — R600 Localized edema: Secondary | ICD-10-CM | POA: Diagnosis not present

## 2022-05-25 DIAGNOSIS — I10 Essential (primary) hypertension: Secondary | ICD-10-CM

## 2022-05-25 DIAGNOSIS — Z1159 Encounter for screening for other viral diseases: Secondary | ICD-10-CM

## 2022-05-25 DIAGNOSIS — L659 Nonscarring hair loss, unspecified: Secondary | ICD-10-CM

## 2022-05-25 NOTE — Patient Instructions (Addendum)
A few things to remember from today's visit:  Stage 3b chronic kidney disease ([place]) - Plan: BMP with eGFR([facility]), CBC, VITAMIN D 25 Hydroxy (Vit-D Deficiency, Fractures), Microalbumin / creatinine urine ratio  Hypokalemia - Plan: BMP with eGFR([facility])  Benign essential hypertension  Hair loss disorder - Plan: CBC, TSH, Iron, Ferritin  If you need refills please call your pharmacy. Do not use My Chart to request refills or for acute issues that need immediate attention.  Please check potassium dose at home and continue dose you are currently taking.  Gabapentin 300 mg 2 times daily.  Aspirin 81 mg daily. Depending on lab results we will either increase metoprolol or resuming one of your old medications. No changes in Furosemide for now.  Continue monitoring blood pressure.  Please be sure medication list is accurate. If a new problem present, please set up appointment sooner than planned today.

## 2022-05-26 MED ORDER — LOSARTAN POTASSIUM 25 MG PO TABS: 25.0000 mg | ORAL_TABLET | Freq: Every day | ORAL | 0 refills | Status: DC

## 2022-05-26 MED ORDER — FUROSEMIDE 20 MG PO TABS: 20.0000 mg | ORAL_TABLET | Freq: Every day | ORAL | 1 refills | Status: DC

## 2022-05-28 LAB — BASIC METABOLIC PANEL WITH GFR
BUN/Creatinine Ratio: 12 (calc) (ref 6–22)
BUN: 14 mg/dL (ref 7–25)
CO2: 30 mmol/L (ref 20–32)
Calcium: 10.3 mg/dL (ref 8.6–10.4)
Chloride: 101 mmol/L (ref 98–110)
Creat: 1.13 mg/dL — ABNORMAL HIGH (ref 0.60–1.00)
Glucose, Bld: 95 mg/dL (ref 65–99)
Potassium: 4.5 mmol/L (ref 3.5–5.3)
Sodium: 140 mmol/L (ref 135–146)
eGFR: 50 mL/min/{1.73_m2} — ABNORMAL LOW (ref >=60)

## 2022-05-28 LAB — CBC
HCT: 37.5 % (ref 35.0–45.0)
Hemoglobin: 12.7 g/dL (ref 11.7–15.5)
MCH: 31.4 pg (ref 27.0–33.0)
MCHC: 33.9 g/dL (ref 32.0–36.0)
MCV: 92.6 fL (ref 80.0–100.0)
MPV: 10.1 fL (ref 7.5–12.5)
Platelets: 196 10*3/uL (ref 140–400)
RBC: 4.05 10*6/uL (ref 3.80–5.10)
RDW: 12.7 % (ref 11.0–15.0)
WBC: 7.9 10*3/uL (ref 3.8–10.8)

## 2022-05-28 LAB — FERRITIN: Ferritin: 23 ng/mL (ref 16–288)

## 2022-05-28 LAB — VITAMIN D 25 HYDROXY (VIT D DEFICIENCY, FRACTURES): Vit D, 25-Hydroxy: 68 ng/mL (ref 30–100)

## 2022-05-28 LAB — IRON: Iron: 76 ug/dL (ref 45–160)

## 2022-05-28 LAB — MICROALBUMIN / CREATININE URINE RATIO
Creatinine, Urine: 77 mg/dL (ref 20–275)
Microalb, Ur: <0.2 mg/dL

## 2022-05-28 LAB — HEPATITIS C ANTIBODY: Hepatitis C Ab: NONREACTIVE

## 2022-05-28 LAB — TSH: TSH: 2.91 mIU/L (ref 0.40–4.50)

## 2022-06-07 ENCOUNTER — Other Ambulatory Visit: Payer: Self-pay | Admitting: Family Medicine

## 2022-06-07 DIAGNOSIS — M816 Localized osteoporosis [Lequesne]: Secondary | ICD-10-CM

## 2022-06-20 ENCOUNTER — Other Ambulatory Visit: Payer: Self-pay | Admitting: Family Medicine

## 2022-06-25 ENCOUNTER — Telehealth: Payer: Self-pay | Admitting: Family Medicine

## 2022-06-25 DIAGNOSIS — G6289 Other specified polyneuropathies: Secondary | ICD-10-CM

## 2022-06-25 NOTE — Telephone Encounter (Signed)
Pt requesting refill gabapentin (NEURONTIN) 300 MG capsule  [id number] [place], [place] - [address] Phone:  [phone]  Fax:  [phone]

## 2022-06-27 MED ORDER — GABAPENTIN 300 MG PO CAPS: 300.0000 mg | ORAL_CAPSULE | Freq: Two times a day (BID) | ORAL | 0 refills | Status: DC

## 2022-06-27 NOTE — Addendum Note (Signed)
Addended by: Weyman Croon E on: 06/27/2022 09:00 AM   Modules accepted: Orders

## 2022-06-27 NOTE — Telephone Encounter (Signed)
Rx sent in

## 2022-07-05 DIAGNOSIS — M5416 Radiculopathy, lumbar region: Secondary | ICD-10-CM | POA: Diagnosis not present

## 2022-07-05 DIAGNOSIS — G8929 Other chronic pain: Secondary | ICD-10-CM | POA: Diagnosis not present

## 2022-07-05 DIAGNOSIS — Z79899 Other long term (current) drug therapy: Secondary | ICD-10-CM | POA: Diagnosis not present

## 2022-07-26 ENCOUNTER — Other Ambulatory Visit: Payer: Self-pay | Admitting: Family Medicine

## 2022-07-26 DIAGNOSIS — G6289 Other specified polyneuropathies: Secondary | ICD-10-CM

## 2022-08-05 ENCOUNTER — Other Ambulatory Visit: Payer: Self-pay | Admitting: Family Medicine

## 2022-08-16 ENCOUNTER — Other Ambulatory Visit: Payer: Self-pay | Admitting: Family Medicine

## 2022-08-16 DIAGNOSIS — I10 Essential (primary) hypertension: Secondary | ICD-10-CM

## 2022-08-16 DIAGNOSIS — N1832 Chronic kidney disease, stage 3b: Secondary | ICD-10-CM

## 2022-08-17 NOTE — Telephone Encounter (Signed)
This medication has not been resumed, it was d/c before last hospitalization. Thanks, BJ

## 2022-08-24 NOTE — Progress Notes (Unsigned)
HPI: [name] is a 76 y.o. female with medical hx significant for HLD,HTN,peripheral neuropathy,chronic pain,anxiety, unstable gait, and PAD here today for 3 months follow up. She was last seen on 05/25/22.  She reports worsening LE's pain and is seeing pain management every two to three months.She takes Nucynta 200 mg bid.  Peripheral neuropathy on Gabapentin 300 mg bid, she is not sure if it is helping. She has a history of high alcohol intake, currently consuming two to three glasses of wine per day. Thought her neuropathy may be related to alcohol consumption. LE numbness, tingling, and burning. No new associated symptoms. States that his pain management provider recommended seeing neuro, she has seen Dr Posey Pronto but would like an appt with Dr Krista [facility] at [facility] neurologist. Lumbar MRI done in 10/2021 showed mild spinal stenosis and disc bulge.  HTN:She has not been checking her blood pressure at home. Her renal function has not been back to her baseline since hospitalization for AKI in 01/2022. Negative for severe/frequent headache, visual changes, chest pain, dyspnea, palpitation, or new focal weakness. She is on Losartan 25 mg daily and Metoprolol succinate 25 mg daily. Lab Results  Component Value Date   CREATININE 1.13 (H) 05/25/2022   BUN 14 05/25/2022   NA 140 05/25/2022   K 4.5 05/25/2022   CL 101 05/25/2022   CO2 30 05/25/2022   Lower extremities edema: She has not worn compression stockings during the summer but plans to start wearing them again.  No significant changes. LE edema worse at the end of the day. Negative for ulcers or erythema. She is on Furosemide 20 mg daily.  Concerned about skin tags, hyperpigmented lesion and a lump on her left wrist. The latter one has been present for over a year without pain or pruritus.   She also mentions changes in bowel habits, with daily bowel movements that are thinner and longer than before. She has not made any  significant changes to her diet or fluid intake.  States that 3 months ago she was having more constipation and her pain management provider recommended having a colonoscopy. Last one on 03/19/16.  Broke a tooth when eating popcorn a few weeks ago. She is following with dentist and wonders if she needs to replace tooth.  Review of Systems  Constitutional:  Positive for fatigue. Negative for activity change, appetite change and fever.  HENT:  Negative for mouth sores, nosebleeds and sore throat.   Respiratory:  Negative for cough and wheezing.   Gastrointestinal:  Negative for abdominal pain, nausea and vomiting.  Endocrine: Negative for cold intolerance and heat intolerance.  Genitourinary:  Negative for decreased urine volume, dysuria and hematuria.  Musculoskeletal:  Positive for arthralgias and gait problem.  Neurological:  Negative for syncope and facial asymmetry.  Psychiatric/Behavioral:  Negative for confusion and hallucinations. The patient is nervous/anxious.   Rest see pertinent positives and negatives per HPI.  Current Outpatient Medications on File Prior to Visit  Medication Sig Dispense Refill   alendronate (FOSAMAX) 70 MG tablet TAKE 1 TABLET BY MOUTH EVERY WEEK. TAKE WITH A FULL GLASS OF WATER ON AN EMPTY STOMACH. 12 tablet 0   Ascorbic Acid (VITAMIN C) 1000 MG tablet Take 1,000 mg by mouth daily.     aspirin EC 81 MG tablet Take 81 mg by mouth daily. Swallow whole.     Cholecalciferol (VITAMIN D3) 125 MCG (5000 UT) CAPS Take 1 capsule by mouth daily.     desonide (DESOWEN) 0.05 %  cream APPLY TO FACE TOPICALLY DAILY AS NEEDED (Patient taking differently: Apply 1 application  topically daily as needed (rash on face).) 30 g 1   folic acid (FOLVITE) [id number] MCG tablet Take 400 mcg by mouth daily.     furosemide (LASIX) 20 MG tablet Take 1 tablet (20 mg total) by mouth daily. 90 tablet 1   gabapentin (NEURONTIN) 300 MG capsule TAKE 1 CAPSULE BY MOUTH TWICE A DAY 60 capsule 3    losartan (COZAAR) 25 MG tablet TAKE 1 TABLET (25 MG TOTAL) BY MOUTH DAILY. 90 tablet 0   omeprazole (PRILOSEC) 20 MG capsule Take 1 capsule (20 mg total) by mouth daily. 90 capsule 1   pyridOXINE (VITAMIN B-6) 100 MG tablet Take 100 mg by mouth daily.     rosuvastatin (CRESTOR) 10 MG tablet TAKE 1 TABLET BY MOUTH EVERY DAY 90 tablet 2   tapentadol (NUCYNTA) 100 MG 12 hr tablet Take 200 mg by mouth every 12 (twelve) hours.     thiamine (VITAMIN B-1) 100 MG tablet Take 100 mg by mouth daily.     triamcinolone cream (KENALOG) 0.1 % APPLY TO LEGS TOPICALLY TWICE A DAY AS NEEDED (Patient taking differently: Apply 1 application  topically 2 (two) times daily as needed (rash on legs).) 30 g 2   No current facility-administered medications on file prior to visit.   Past Medical History:  Diagnosis Date   Anxiety    Depression    GERD (gastroesophageal reflux disease)    Hypertension    Peripheral neuropathy    Allergies  Allergen Reactions   Epinephrine Other (See Comments) and Palpitations    "Heart Races Uncomfortably"    Social History   Socioeconomic History   Marital status: Married    Spouse name: Not on file   Number of children: 1   Years of education: Not on file   Highest education level: Associate degree: academic program  Occupational History   Occupation: retired  Tobacco Use   Smoking status: Former    Types: Cigarettes    Quit date: 1984    Years since quitting: 39.8   Smokeless tobacco: Never  Vaping Use   Vaping Use: Never used  Substance and Sexual Activity   Alcohol use: Yes    Alcohol/week: 3.0 standard drinks of alcohol    Types: 3 Glasses of wine per week    Comment: Drinks Wine   Drug use: Yes    Types: Other-see comments    Comment: nucytha   Sexual activity: Not on file  Other Topics Concern   Not on file  Social History Narrative   Right handed   One story home   Drinks occasional caffeine   Social Determinants of Health   Financial  Resource Strain: Low Risk  (11/27/2021)   Overall Financial Resource Strain (CARDIA)    Difficulty of Paying Living Expenses: Not hard at all  Food Insecurity: No Food Insecurity (11/27/2021)   Hunger Vital Sign    Worried About Running Out of Food in the Last Year: Never true    Ran Out of Food in the Last Year: Never true  Transportation Needs: No Transportation Needs (11/27/2021)   PRAPARE - Hydrologist (Medical): No    Lack of Transportation (Non-Medical): No  Physical Activity: Insufficiently Active (11/27/2021)   Exercise Vital Sign    Days of Exercise per Week: 2 days    Minutes of Exercise per Session: 40 min  Stress: Stress Concern  Present (11/27/2021)   [place]    Feeling of Stress : To some extent  Social Connections: Moderately Isolated (11/17/2020)   Social Connection and Isolation Panel [NHANES]    Frequency of Communication with Friends and Family: More than three times a week    Frequency of Social Gatherings with Friends and Family: Never    Attends Religious Services: Never    Marine scientist or Organizations: No    Attends Archivist Meetings: Never    Marital Status: Married   Vitals:   08/27/22 1344  BP: 110/60  Pulse: 97  Resp: 16  Temp: 98.2 F (36.8 C)  SpO2: 98%  Body mass index is 28.81 kg/m.  Physical Exam Vitals and nursing note reviewed.  Constitutional:      General: She is not in acute distress.    Appearance: She is well-developed.  HENT:     Head: Normocephalic and atraumatic.     Mouth/Throat:     Mouth: Mucous membranes are moist.     Pharynx: Oropharynx is clear.   Eyes:     Conjunctiva/sclera: Conjunctivae normal.  Cardiovascular:     Rate and Rhythm: Normal rate and regular rhythm.     Heart sounds: Murmur (SEM II/VI RUSB and LUSB) heard.     Comments: Right> left pedal pitting edema. DP pulses palpable but hard to  find. Pulmonary:     Effort: Pulmonary effort is normal. No respiratory distress.     Breath sounds: Normal breath sounds.  Abdominal:     Palpations: Abdomen is soft.     Tenderness: There is no abdominal tenderness.  Musculoskeletal:     Right lower leg: 1+ Pitting Edema present.     Left lower leg: 1+ Pitting Edema present.  Skin:    General: Skin is warm.     Findings: No erythema or rash.     Comments: Soft pediculated lesions, 1-2 mm around neck. Hyperpigmented macular lesion on left radial aspect of left wrist. Not tender, I do not appreciate a specific mass. She has similar pigmented lesion scattered on UE's, 4-6 mm.  Neurological:     General: No focal deficit present.     Mental Status: She is alert. Mental status is at baseline.     Cranial Nerves: No cranial nerve deficit.     Comments: Gait assisted by a cane.  Psychiatric:        Mood and Affect: Mood is anxious.   ASSESSMENT AND PLAN: Ms.Lyndie was seen today for follow-up.  Diagnoses and all orders for this visit: Orders Placed This Encounter  Procedures   Basic metabolic panel   Hepatic function panel   Ambulatory referral to Neurology   Ambulatory referral to Gastroenterology   VAS [place] ABI WITH/WO TBI   Lab Results  Component Value Date   CREATININE 1.22 (H) 08/27/2022   BUN 16 08/27/2022   NA 135 08/27/2022   K 4.0 08/27/2022   CL 98 08/27/2022   CO2 27 08/27/2022   Lab Results  Component Value Date   ALT 17 08/27/2022   AST 23 08/27/2022   ALKPHOS 56 08/27/2022   BILITOT 0.7 08/27/2022   Change in bowel habits Requesting referral to GI to discuss colonoscopy, referral placed. Continue adequate fiber and fluid intake.  Decreased pulses in feet Hard to find DP pulses.  ABI will be arranged.  Cutaneous skin tags Reassured. She can try OTC products for tag removal.  Seborrheic keratosis Educated about Dx,prognosis,and treatment. No suspicious lesions appreciated today. Continue  monitoring for changes.  Peripheral neuropathy Problem is getting worse. For now continue Gabapentin 300 mg bid, even though she is not sure if it is helping. Thought to be caused by alcohol intake, so recommend decreasing intake to recommended amount for women, 4 oz/day. Fall precautions. Neuro referral placed as requested to see Dr Krista Blue.   Hyperlipemia, mixed Last LDL 97. She is not fasting today, so we will plan on checking FLP next visit. Continue Rosuvastatin 10 mg daily and low fat diet.  Bilateral lower extremity edema Recommend wearing compression stockings. She can try decreasing Furosemide frequency from daily to every other day or daily prn. Continue appropriate skin care.  Benign essential hypertension BP adequately controlled. Continue current management: Metoprolol succinate 25 mg daily and Losartan 25 mg daily. DASH/low salt diet to continue. Monitor BP at home. Eye exam is current.  CKD (chronic kidney disease), stage III ([place]) Renal function has improved but not back to her baseline, 58- 60. Cr 1.1-1.4 and e GFR 40's,last one 50. 01/2022 e GFR 17 and Cr 2.58. Educated about the importance of adequate hydration and bp controlled as well as low salt diet and avoidance of NSAID's.  I spent a total of 45 minutes in both face to face and non face to face activities for this visit on the date of this encounter. During this time history was obtained and documented, examination was performed, prior labs/imaging reviewed, and assessment/plan discussed. In regard to missing tooth, recommend considering replacing the tooth to prevent other teeth from shifting, discuss with dentist replacement options.  Return in about 25 weeks (around 02/18/2023).  Jamiere Gulas G. [place], MD  [facility]. [place] office.

## 2022-08-27 ENCOUNTER — Encounter: Payer: Self-pay | Admitting: Family Medicine

## 2022-08-27 ENCOUNTER — Ambulatory Visit: Payer: Medicare PPO | Admitting: Family Medicine

## 2022-08-27 VITALS — BP 110/60 | HR 97 | Temp 98.2°F | Resp 16 | Ht 65.0 in | Wt 173.1 lb

## 2022-08-27 DIAGNOSIS — R194 Change in bowel habit: Secondary | ICD-10-CM | POA: Diagnosis not present

## 2022-08-27 DIAGNOSIS — R0989 Other specified symptoms and signs involving the circulatory and respiratory systems: Secondary | ICD-10-CM | POA: Diagnosis not present

## 2022-08-27 DIAGNOSIS — N1832 Chronic kidney disease, stage 3b: Secondary | ICD-10-CM

## 2022-08-27 DIAGNOSIS — I10 Essential (primary) hypertension: Secondary | ICD-10-CM | POA: Diagnosis not present

## 2022-08-27 DIAGNOSIS — E782 Mixed hyperlipidemia: Secondary | ICD-10-CM | POA: Diagnosis not present

## 2022-08-27 DIAGNOSIS — R600 Localized edema: Secondary | ICD-10-CM

## 2022-08-27 DIAGNOSIS — L821 Other seborrheic keratosis: Secondary | ICD-10-CM

## 2022-08-27 DIAGNOSIS — G6289 Other specified polyneuropathies: Secondary | ICD-10-CM | POA: Diagnosis not present

## 2022-08-27 DIAGNOSIS — L918 Other hypertrophic disorders of the skin: Secondary | ICD-10-CM | POA: Diagnosis not present

## 2022-08-27 LAB — HEPATIC FUNCTION PANEL
ALT: 17 U/L (ref 0–35)
AST: 23 U/L (ref 0–37)
Albumin: 4.4 g/dL (ref 3.5–5.2)
Alkaline Phosphatase: 56 U/L (ref 39–117)
Bilirubin, Direct: 0.2 mg/dL (ref 0.0–0.3)
Total Bilirubin: 0.7 mg/dL (ref 0.2–1.2)
Total Protein: 7.5 g/dL (ref 6.0–8.3)

## 2022-08-27 LAB — BASIC METABOLIC PANEL
BUN: 16 mg/dL (ref 6–23)
CO2: 27 mEq/L (ref 19–32)
Calcium: 9.7 mg/dL (ref 8.4–10.5)
Chloride: 98 mEq/L (ref 96–112)
Creatinine, Ser: 1.22 mg/dL — ABNORMAL HIGH (ref 0.40–1.20)
GFR: 43.18 mL/min — ABNORMAL LOW (ref >60.00)
Glucose, Bld: 119 mg/dL — ABNORMAL HIGH (ref 70–99)
Potassium: 4 mEq/L (ref 3.5–5.1)
Sodium: 135 mEq/L (ref 135–145)

## 2022-08-27 MED ORDER — METOPROLOL SUCCINATE ER 25 MG PO TB24: 25.0000 mg | ORAL_TABLET | Freq: Every day | ORAL | 2 refills | Status: DC

## 2022-08-27 NOTE — Assessment & Plan Note (Signed)
Last LDL 97. She is not fasting today, so we will plan on checking FLP next visit. Continue Rosuvastatin 10 mg daily and low fat diet.

## 2022-08-27 NOTE — Assessment & Plan Note (Addendum)
Recommend wearing compression stockings. She can try decreasing Furosemide frequency from daily to every other day or daily prn. Continue appropriate skin care.

## 2022-08-27 NOTE — Assessment & Plan Note (Signed)
BP adequately controlled. Continue current management: Metoprolol succinate 25 mg daily and Losartan 25 mg daily. DASH/low salt diet to continue. Monitor BP at home. Eye exam is current.

## 2022-08-27 NOTE — Patient Instructions (Addendum)
A few things to remember from today's visit:  Other polyneuropathy - Plan: Ambulatory referral to Neurology  Benign essential hypertension - Plan: Basic metabolic panel  Change in bowel habits - Plan: Ambulatory referral to Gastroenterology  Decreased pulses in feet - Plan: VAS [place] ABI WITH/WO TBI  Appointment with neurologist and gastroenterologist will be arranged. You can decrease the frequency of Furosemide to every other day or daily as needed. Compression stocking still recommended. Avoid meds like Ibuprofen and Aleve, just tylenol 500 mg 1-2 times daily for pain. Next visit we will check cholesterol, fasting.  If you need refills for medications you take chronically, please call your pharmacy. Do not use My Chart to request refills or for acute issues that need immediate attention. If you send a my chart message, it may take a few days to be addressed, specially if I am not in the office.  Please be sure medication list is accurate. If a new problem present, please set up appointment sooner than planned today.

## 2022-08-27 NOTE — Assessment & Plan Note (Addendum)
Renal function has improved but not back to her baseline, 58- 60. Cr 1.1-1.4 and e GFR 40's,last one 50. 01/2022 e GFR 17 and Cr 2.58. Educated about the importance of adequate hydration and bp controlled as well as low salt diet and avoidance of NSAID's.

## 2022-08-27 NOTE — Assessment & Plan Note (Signed)
Problem is getting worse. For now continue Gabapentin 300 mg bid, even though she is not sure if it is helping. Thought to be caused by alcohol intake, so recommend decreasing intake to recommended amount for women, 4 oz/day. Fall precautions. Neuro referral placed as requested to see Dr Krista Blue.

## 2022-09-02 ENCOUNTER — Other Ambulatory Visit: Payer: Self-pay | Admitting: Family Medicine

## 2022-09-02 DIAGNOSIS — M816 Localized osteoporosis [Lequesne]: Secondary | ICD-10-CM

## 2022-09-05 ENCOUNTER — Ambulatory Visit (HOSPITAL_COMMUNITY)
Admission: RE | Admit: 2022-09-05 | Discharge: 2022-09-05 | Disposition: A | Discharge status: Home / Self Care | Payer: Medicare PPO | Source: Ambulatory Visit | Attending: Family Medicine | Admitting: Family Medicine

## 2022-09-05 DIAGNOSIS — R0989 Other specified symptoms and signs involving the circulatory and respiratory systems: Secondary | ICD-10-CM | POA: Insufficient documentation

## 2022-09-06 DIAGNOSIS — M5416 Radiculopathy, lumbar region: Secondary | ICD-10-CM | POA: Diagnosis not present

## 2022-09-06 DIAGNOSIS — G8929 Other chronic pain: Secondary | ICD-10-CM | POA: Diagnosis not present

## 2022-10-11 ENCOUNTER — Other Ambulatory Visit: Payer: Self-pay | Admitting: Family Medicine

## 2022-10-19 ENCOUNTER — Encounter: Payer: Self-pay | Admitting: Gastroenterology

## 2022-10-30 ENCOUNTER — Encounter: Payer: Self-pay | Admitting: Gastroenterology

## 2022-10-30 ENCOUNTER — Ambulatory Visit: Payer: Medicare PPO | Admitting: Gastroenterology

## 2022-10-30 VITALS — BP 118/64 | HR 105 | Ht 65.0 in | Wt 173.0 lb

## 2022-10-30 DIAGNOSIS — K5900 Constipation, unspecified: Secondary | ICD-10-CM

## 2022-10-30 NOTE — Progress Notes (Signed)
10/30/2022 Rebecca Baird [id number] [birth year]   HISTORY OF PRESENT ILLNESS: This is a 76 year old female who is a patient of Dr. Orvan Falconer, accepted by Dr. Orvan Falconer and records reviewed by Dr. Orvan Falconer in early 2022.  Patient moved to [facility], transferred her care here.  Her last colonoscopy is in 2017 as documented in previous phone notes from Dr. Orvan Falconer.  Not due again until April/May 2027.  The patient comes here today with complaints of constipation.  She tells me that she has been on Nucynta for neuropathy for 3 years and thinks that is causing constipation.  This has been an issue for a while, but worse recently.  Sounds like she maybe only goes up to 4 days or so without a bowel movement.  She has been using some senna for the past couple of weeks, started at 2 a day and then started just increased to 3 a day yesterday.  She says that she has had several large bowel movements today.  She sees very minimal amounts of bleeding on occasion on the toilet paper from her hemorrhoids.  CBC, TSH, iron studies, hepatic function panel all look good within the past several months.   Past Medical History:  Diagnosis Date   Anxiety    Depression    GERD (gastroesophageal reflux disease)    Hypertension    Peripheral neuropathy    Past Surgical History:  Procedure Laterality Date   broken ankle     cataract surgery     NOSE SURGERY      reports that she quit smoking about 39 years ago. Her smoking use included cigarettes. She has never used smokeless tobacco. She reports current alcohol use of about 3.0 standard drinks of alcohol per week. She reports current drug use. Drug: Other-see comments. family history includes Breast cancer in her cousin and mother; Breast cancer (age of onset: 64) in her sister. Allergies  Allergen Reactions   Epinephrine Other (See Comments) and Palpitations    "Heart Races Uncomfortably"       Outpatient Encounter Medications as of 10/30/2022   Medication Sig   alendronate (FOSAMAX) 70 MG tablet TAKE 1 TABLET BY MOUTH EVERY WEEK. TAKE WITH A FULL GLASS OF WATER ON AN EMPTY STOMACH.   Ascorbic Acid (VITAMIN C) 1000 MG tablet Take 1,000 mg by mouth daily.   aspirin EC 81 MG tablet Take 81 mg by mouth daily. Swallow whole.   Cholecalciferol (VITAMIN D3) 125 MCG (5000 UT) CAPS Take 1 capsule by mouth daily.   desonide (DESOWEN) 0.05 % cream APPLY TO FACE TOPICALLY DAILY AS NEEDED (Patient taking differently: Apply 1 application  topically daily as needed (rash on face).)   folic acid (FOLVITE) 800 MCG tablet Take 400 mcg by mouth daily.   furosemide (LASIX) 20 MG tablet Take 1 tablet (20 mg total) by mouth daily.   gabapentin (NEURONTIN) 300 MG capsule TAKE 1 CAPSULE BY MOUTH TWICE A DAY   losartan (COZAAR) 25 MG tablet TAKE 1 TABLET (25 MG TOTAL) BY MOUTH DAILY.   metoprolol succinate (TOPROL-XL) 25 MG 24 hr tablet Take 1 tablet (25 mg total) by mouth daily.   NUCYNTA ER 200 MG TB12 Take 200 mg by mouth 2 (two) times daily.   omeprazole (PRILOSEC) 20 MG capsule TAKE 1 CAPSULE BY MOUTH EVERY DAY   pyridOXINE (VITAMIN B-6) 100 MG tablet Take 100 mg by mouth daily.   rosuvastatin (CRESTOR) 10 MG tablet TAKE 1 TABLET BY MOUTH EVERY DAY  tapentadol (NUCYNTA) 100 MG 12 hr tablet Take 200 mg by mouth every 12 (twelve) hours.   thiamine (VITAMIN B-1) 100 MG tablet Take 100 mg by mouth daily.   triamcinolone cream (KENALOG) 0.1 % APPLY TO LEGS TOPICALLY TWICE A DAY AS NEEDED (Patient taking differently: Apply 1 application  topically 2 (two) times daily as needed (rash on legs).)   No facility-administered encounter medications on file as of 10/30/2022.     REVIEW OF SYSTEMS  : All other systems reviewed and negative except where noted in the History of Present Illness.   PHYSICAL EXAM: BP 118/64 (BP Location: Left Arm, Patient Position: Sitting, Cuff Size: Normal)   Pulse (!) 105   Ht 5\' 5"  (1.651 m)   Wt 173 lb (78.5 kg)   SpO2 97%    BMI 28.79 kg/m  General: Well developed white female in no acute distress Head: Normocephalic and atraumatic Eyes:  Sclerae anicteric, conjunctiva pink. Ears: Normal auditory acuity Lungs: Clear throughout to auscultation; no W/R/R. Heart: Regular rate and rhythm; no M/R/G. Abdomen: Soft, non-distended.  BS present.  Non-tender. Musculoskeletal: Symmetrical with no gross deformities  Skin: No lesions on visible extremities Extremities: No edema  Neurological: Alert oriented x 4, grossly non-focal Psychological:  Alert and cooperative. Normal mood and affect  ASSESSMENT AND PLAN: *76 year old female with complaints of chronic constipation.  Has been on Nucynta for about the past 3 years or so for her neuropathy.  She believes that is likely contributing to her constipation.  She has been using senna at home.  She has an appointment with her pain management doctor on Friday and would like to try to come off of the Nucynta.  We discussed trying MiraLAX starting with 1 capful daily mixed in 8 ounces of liquid and increasing to twice daily if needed.  If she does in fact come off of the Nucynta then the MiraLAX may work great for her and she may not even need it.  She is going to send Friday a message in about a month with an update on her symptoms.   CC:  [place], Betty G, MD

## 2022-10-30 NOTE — Patient Instructions (Signed)
Start Miralax 1 capful daily in 8 ounces of liquid, may increase to twice daily if needed.  Send a message via Mychart in a month with an update.   _______________________________________________________  If you are age 76 or older, your body mass index should be between 23-30. Your Body mass index is 28.79 kg/m. If this is out of the aforementioned range listed, please consider follow up with your Primary Care Provider.  If you are age 61 or younger, your body mass index should be between 19-25. Your Body mass index is 28.79 kg/m. If this is out of the aformentioned range listed, please consider follow up with your Primary Care Provider.   ________________________________________________________  The [facility] providers would like to encourage you to use [facility] to communicate with providers for non-urgent requests or questions.  Due to long hold times on the telephone, sending your provider a message by [facility] may be a faster and more efficient way to get a response.  Please allow 48 business hours for a response.  Please remember that this is for non-urgent requests.  _______________________________________________________

## 2022-10-31 ENCOUNTER — Telehealth: Payer: Self-pay | Admitting: Family Medicine

## 2022-10-31 NOTE — Telephone Encounter (Signed)
Left message for patient to call back and schedule Medicare Annual Wellness Visit (AWV) either virtually or in office. Left  my jabber number [phone]   Last AWV 11/27/21  please schedule with Nurse Health Adviser   45 min for awv-i and in office appointments 30 min for awv-s  phone/virtual appointments  

## 2022-11-01 DIAGNOSIS — M5416 Radiculopathy, lumbar region: Secondary | ICD-10-CM | POA: Diagnosis not present

## 2022-11-01 DIAGNOSIS — G8929 Other chronic pain: Secondary | ICD-10-CM | POA: Diagnosis not present

## 2022-11-08 NOTE — Progress Notes (Signed)
Reviewed and agree with management plans. ? ?Rebecca Baird L. Cheri Ayotte, MD, MPH  ?

## 2022-11-14 ENCOUNTER — Other Ambulatory Visit: Payer: Self-pay | Admitting: Family Medicine

## 2022-11-15 ENCOUNTER — Other Ambulatory Visit: Payer: Self-pay | Admitting: Family Medicine

## 2022-11-15 DIAGNOSIS — I10 Essential (primary) hypertension: Secondary | ICD-10-CM

## 2022-11-15 DIAGNOSIS — N1832 Chronic kidney disease, stage 3b: Secondary | ICD-10-CM

## 2022-11-19 ENCOUNTER — Other Ambulatory Visit: Payer: Self-pay | Admitting: Family Medicine

## 2022-11-19 DIAGNOSIS — G6289 Other specified polyneuropathies: Secondary | ICD-10-CM

## 2022-11-29 ENCOUNTER — Telehealth: Payer: Medicare PPO | Admitting: Family Medicine

## 2022-12-28 ENCOUNTER — Ambulatory Visit (INDEPENDENT_AMBULATORY_CARE_PROVIDER_SITE_OTHER): Payer: Medicare PPO

## 2022-12-28 VITALS — Ht 65.0 in | Wt 173.0 lb

## 2022-12-28 DIAGNOSIS — Z0000 Encounter for general adult medical examination without abnormal findings: Secondary | ICD-10-CM | POA: Diagnosis not present

## 2022-12-28 NOTE — Progress Notes (Signed)
Medic  Subjective:   Rebecca Baird is a 77 y.o. female who presents for Medicare Annual (Subsequent) preventive examination.  Review of Systems    Virtual Visit via Telephone Note  I connected with  Rebecca Baird on 12/28/22 at  3:30 PM EST by telephone and verified that I am speaking with the correct person using two identifiers.  Location: Patient: Home Provider: Office Persons participating in the virtual visit: patient/Nurse Health Advisor   I discussed the limitations, risks, security and privacy concerns of performing an evaluation and management service by telephone and the availability of in person appointments. The patient expressed understanding and agreed to proceed.  Interactive audio and video telecommunications were attempted between this nurse and patient, however failed, due to patient having technical difficulties OR patient did not have access to video capability.  We continued and completed visit with audio only.  Some vital signs may be absent or patient reported.   Criselda Peaches, LPN  Cardiac Risk Factors include: advanced age (>72mn, >>74women);hypertension     Objective:    Today's Vitals   12/28/22 1601  Weight: 173 lb (78.5 kg)  Height: '5\' 5"'$  (1.651 m)   Body mass index is 28.79 kg/m.     12/28/2022    4:17 PM 01/26/2022    5:15 PM 01/15/2022    1:51 PM 11/27/2021   11:34 AM 10/17/2021    2:45 PM 12/14/2019    2:00 PM  Advanced Directives  Does Patient Have a Medical Advance Directive? No No No No No No  Would patient like information on creating a medical advance directive? No - Patient declined No - Patient declined   No - Patient declined     Current Medications (verified) Outpatient Encounter Medications as of 12/28/2022  Medication Sig   alendronate (FOSAMAX) 70 MG tablet TAKE 1 TABLET BY MOUTH EVERY WEEK. TAKE WITH A FULL GLASS OF WATER ON AN EMPTY STOMACH.   Ascorbic Acid (VITAMIN C) 1000 MG tablet Take 1,000 mg by mouth  daily.   aspirin EC 81 MG tablet Take 81 mg by mouth daily. Swallow whole.   Cholecalciferol (VITAMIN D3) 125 MCG (5000 UT) CAPS Take 1 capsule by mouth daily.   desonide (DESOWEN) 0.05 % cream APPLY TO FACE TOPICALLY DAILY AS NEEDED (Patient taking differently: Apply 1 application  topically daily as needed (rash on face).)   folic acid (FOLVITE) [id number] tablet Take 400 mcg by mouth daily.   furosemide (LASIX) 20 MG tablet TAKE 1 TABLET BY MOUTH EVERY DAY   gabapentin (NEURONTIN) 300 MG capsule TAKE 1 CAPSULE BY MOUTH TWICE A DAY   losartan (COZAAR) 25 MG tablet TAKE 1 TABLET (25 MG TOTAL) BY MOUTH DAILY.   metoprolol succinate (TOPROL-XL) 25 MG 24 hr tablet Take 1 tablet (25 mg total) by mouth daily.   NUCYNTA ER 200 MG TB12 Take 200 mg by mouth 2 (two) times daily.   omeprazole (PRILOSEC) 20 MG capsule TAKE 1 CAPSULE BY MOUTH EVERY DAY   pyridOXINE (VITAMIN B-6) 100 MG tablet Take 100 mg by mouth daily.   rosuvastatin (CRESTOR) 10 MG tablet TAKE 1 TABLET BY MOUTH EVERY DAY   tapentadol (NUCYNTA) 100 MG 12 hr tablet Take 200 mg by mouth every 12 (twelve) hours.   thiamine (VITAMIN B-1) 100 MG tablet Take 100 mg by mouth daily.   triamcinolone cream (KENALOG) 0.1 % APPLY TO LEGS TOPICALLY TWICE A DAY AS NEEDED (Patient taking differently: Apply 1 application  topically 2 (two) times daily as needed (rash on legs).)   No facility-administered encounter medications on file as of 12/28/2022.    Allergies (verified) Epinephrine   History: Past Medical History:  Diagnosis Date   Anxiety    Depression    GERD (gastroesophageal reflux disease)    Hypertension    Peripheral neuropathy    Past Surgical History:  Procedure Laterality Date   broken ankle     cataract surgery     NOSE SURGERY     Family History  Problem Relation Age of Onset   Breast cancer Mother    Breast cancer Sister 79   Breast cancer Cousin    Social History   Socioeconomic History   Marital status: Married     Spouse name: Not on file   Number of children: 1   Years of education: Not on file   Highest education level: Associate degree: academic program  Occupational History   Occupation: retired  Tobacco Use   Smoking status: Former    Types: Cigarettes    Quit date: 1984    Years since quitting: 40.1   Smokeless tobacco: Never  Vaping Use   Vaping Use: Never used  Substance and Sexual Activity   Alcohol use: Yes    Alcohol/week: 3.0 standard drinks of alcohol    Types: 3 Glasses of wine per week    Comment: Drinks Wine   Drug use: Yes    Types: Other-see comments    Comment: nucytha   Sexual activity: Not on file  Other Topics Concern   Not on file  Social History Narrative   Right handed   One story home   Drinks occasional caffeine   Social Determinants of Health   Financial Resource Strain: Low Risk  (12/28/2022)   Overall Financial Resource Strain (CARDIA)    Difficulty of Paying Living Expenses: Not hard at all  Food Insecurity: No Food Insecurity (12/28/2022)   Hunger Vital Sign    Worried About Running Out of Food in the Last Year: Never true    Ran Out of Food in the Last Year: Never true  Transportation Needs: No Transportation Needs (12/28/2022)   PRAPARE - Hydrologist (Medical): No    Lack of Transportation (Non-Medical): No  Physical Activity: Sufficiently Active (12/28/2022)   Exercise Vital Sign    Days of Exercise per Week: 5 days    Minutes of Exercise per Session: 30 min  Stress: No Stress Concern Present (12/28/2022)   [place]    Feeling of Stress : Not at all  Social Connections: [place] (12/28/2022)   Social Connection and Isolation Panel [NHANES]    Frequency of Communication with Friends and Family: More than three times a week    Frequency of Social Gatherings with Friends and Family: More than three times a week    Attends Religious  Services: More than 4 times per year    Active Member of [employer] or Organizations: Yes    Attends Music therapist: More than 4 times per year    Marital Status: Married    Tobacco Counseling Counseling given: Not Answered   Clinical Intake:  Pre-visit preparation completed: Yes  Pain : No/denies pain     BMI - recorded: 28.79 Nutritional Status: BMI 25 -29 Overweight Nutritional Risks: None Diabetes: No  How often do you need to have someone help you when you read instructions,  pamphlets, or other written materials from your doctor or pharmacy?: 1 - Never  Diabetic?  No  Interpreter Needed?: No  Information entered by :: Rolene Arbour LPN   Activities of Daily Living    12/28/2022    4:13 PM  In your present state of health, do you have any difficulty performing the following activities:  Hearing? 0  Vision? 0  Difficulty concentrating or making decisions? 0  Walking or climbing stairs? 1  Comment Uses a cane , walker and wheelchair  Dressing or bathing? 0  Doing errands, shopping? 1  Comment Husband assist  Preparing Food and eating ? Y  Comment Husband assist  Using the Toilet? N  In the past six months, have you accidently leaked urine? N  Do you have problems with loss of bowel control? N  Managing your Medications? N  Managing your Finances? N  Housekeeping or managing your Housekeeping? N    Patient Care Team: [place], Betty G, MD as PCP - General (Family Medicine) Alda Berthold, DO as Consulting Physician (Neurology)  Indicate any recent Medical Services you may have received from other than [facility] providers in the past year (date may be approximate).     Assessment:   This is a routine wellness examination for Lizett.  Hearing/Vision screen Hearing Screening - Comments:: Denies hearing difficulties   Vision Screening - Comments:: Wears rx glasses - up to date with routine eye exams with  Freedom issues and  exercise activities discussed: Exercise limited by: None identified   Goals Addressed               This Visit's Progress     Stay Healthy (pt-stated)         Depression Screen    08/27/2022    1:54 PM 05/25/2022    2:34 PM 01/23/2022    2:28 PM 11/27/2021   11:36 AM 11/08/2021    2:25 PM 10/09/2021   11:54 AM 11/17/2020    3:05 PM  PHQ 2/9 Scores  PHQ - 2 Score 0 0 0 0 0 2 2  PHQ- 9 Score 0 0 0   7 7    Fall Risk    12/28/2022    4:17 PM 08/27/2022    1:54 PM 01/15/2022    1:51 PM 11/27/2021   11:35 AM 11/08/2021    8:09 PM  Fall Risk   Falls in the past year? 0 0 1 0 1  Number falls in past yr: 0 0 0  1  Injury with Fall? 0 0 0  0  Risk for fall due to : No Fall Risks History of fall(s)  Impaired balance/gait;Impaired mobility;Medication side effect Impaired mobility;Impaired balance/gait;History of fall(s)  Follow up Falls prevention discussed Falls evaluation completed  Falls evaluation completed;Education provided;Falls prevention discussed Education provided    FALL RISK PREVENTION PERTAINING TO THE HOME:  Any stairs in or around the home? No  If so, are there any without handrails? No  Home free of loose throw rugs in walkways, pet beds, electrical cords, etc? Yes  Adequate lighting in your home to reduce risk of falls? Yes   ASSISTIVE DEVICES UTILIZED TO PREVENT FALLS:  Life alert? No  Use of a cane, walker or w/c? Yes  Grab bars in the bathroom? Yes  Shower chair or bench in shower? Yes  Elevated toilet seat or a handicapped toilet? Yes  TIMED UP AND GO:  Was the test performed? No . Audio  Visit   Cognitive Function:        12/28/2022    4:17 PM 11/27/2021   11:41 AM 11/17/2020    3:13 PM  6CIT Screen  What Year? 0 points 0 points 0 points  What month? 0 points 0 points 0 points  What time? 0 points 0 points 0 points  Count back from 20 0 points 0 points 0 points  Months in reverse 0 points 0 points 0 points  Repeat phrase 0 points 2 points 0  points  Total Score 0 points 2 points 0 points    Immunizations Immunization History  Administered Date(s) Administered   Fluad Quad(high Dose 65+) 08/03/2020, 07/17/2022   Influenza, High Dose Seasonal PF 07/07/2019, 08/02/2021   Influenza,inj,Quad PF,6+ Mos 07/27/2018   PFIZER Comirnaty(Gray Top)Covid-19 Tri-Sucrose Vaccine 03/03/2021, 08/13/2022   PFIZER(Purple Top)SARS-COV-2 Vaccination 01/03/2020, 01/27/2020   Pfizer Covid-19 Vaccine Bivalent Booster 48yr & up 08/18/2021   Pneumococcal Polysaccharide-23 12/18/2020   Tdap 12/14/2020   Zoster Recombinat (Shingrix) 12/05/2020, 03/11/2021    TDAP status: Up to date  Flu Vaccine status: Up to date  Pneumococcal vaccine status: Due, Education has been provided regarding the importance of this vaccine. Advised may receive this vaccine at local pharmacy or Health Dept. Aware to provide a copy of the vaccination record if obtained from local pharmacy or Health Dept. Verbalized acceptance and understanding.  Covid-19 vaccine status: Completed vaccines  Qualifies for Shingles Vaccine? Yes   Zostavax completed Yes   Shingrix Completed?: Yes  Screening Tests Health Maintenance  Topic Date Due   COVID-19 Vaccine (6 - 2023-24 season) 01/13/2023 (Originally 10/08/2022)   Pneumonia Vaccine 77 Years old (2 of 2 - PCV) 12/29/2023 (Originally 12/18/2021)   Medicare Annual Wellness (AWV)  12/29/2023   DTaP/Tdap/Td (2 - Td or Tdap) 12/14/2030   INFLUENZA VACCINE  Completed   DEXA SCAN  Completed   Hepatitis C Screening  Completed   Zoster Vaccines- Shingrix  Completed   HPV VACCINES  Aged Out   COLONOSCOPY (Pts 45-456yrInsurance coverage will need to be confirmed)  Discontinued    Health Maintenance  There are no preventive care reminders to display for this patient.   Colorectal cancer screening: No longer required.   Mammogram status: No longer required due to Age.  Bone Density status: Completed 04/21/21. Results reflect: Bone  density results: OSTEOPOROSIS. Repeat every   years.  Lung Cancer Screening: (Low Dose CT Chest recommended if Age 77-80ears, 30 pack-year currently smoking OR have quit w/in 15years.) does not qualify.     Additional Screening:  Hepatitis C Screening: does qualify; Completed 05/25/22  Vision Screening: Recommended annual ophthalmology exams for early detection of glaucoma and other disorders of the eye. Is the patient up to date with their annual eye exam?  Yes  Who is the provider or what is the name of the office in which the patient attends annual eye exams? [place] pt is not established with a provider, would they like to be referred to a provider to establish care? No .   Dental Screening: Recommended annual dental exams for proper oral hygiene  Community Resource Referral / Chronic Care Management:  CRR required this visit?  No   CCM required this visit?  No      Plan:     I have personally reviewed and noted the following in the patient's chart:   Medical and social history Use of alcohol, tobacco or illicit drugs  Current medications  and supplements including opioid prescriptions. Patient is not currently taking opioid prescriptions. Functional ability and status Nutritional status Physical activity Advanced directives List of other physicians Hospitalizations, surgeries, and ER visits in previous 12 months Vitals Screenings to include cognitive, depression, and falls Referrals and appointments  In addition, I have reviewed and discussed with patient certain preventive protocols, quality metrics, and best practice recommendations. A written personalized care plan for preventive services as well as general preventive health recommendations were provided to patient.     Criselda Peaches, LPN   [id number]   Nurse Notes: None

## 2022-12-28 NOTE — Patient Instructions (Addendum)
Rebecca Baird , Thank you for taking time to come for your Medicare Wellness Visit. I appreciate your ongoing commitment to your health goals. Please review the following plan we discussed and let me know if I can assist you in the future.   These are the goals we discussed:  Goals       DIET - INCREASE WATER INTAKE      Exercise 3x per week (30 min per time)      Patient Stated      11/27/2021, maintain medications and figure out what is happening with back      Stay Healthy (pt-stated)        This is a list of the screening recommended for you and due dates:  Health Maintenance  Topic Date Due   COVID-19 Vaccine (6 - 2023-24 season) 01/13/2023*   Pneumonia Vaccine (2 of 2 - PCV) 12/29/2023*   Medicare Annual Wellness Visit  12/29/2023   DTaP/Tdap/Td vaccine (2 - Td or Tdap) 12/14/2030   Flu Shot  Completed   DEXA scan (bone density measurement)  Completed   Hepatitis C Screening: USPSTF Recommendation to screen - Ages 43-79 yo.  Completed   Zoster (Shingles) Vaccine  Completed   HPV Vaccine  Aged Out   Colon Cancer Screening  Discontinued  *Topic was postponed. The date shown is not the original due date.    Advanced directives: Advance directive discussed with you today. Even though you declined this today, please call our office should you change your mind, and we can give you the proper paperwork for you to fill out.   Conditions/risks identified: None  Next appointment: Follow up in one year for your annual wellness visit     Preventive Care 65 Years and Older, Female Preventive care refers to lifestyle choices and visits with your health care provider that can promote health and wellness. What does preventive care include? A yearly physical exam. This is also called an annual well check. Dental exams once or twice a year. Routine eye exams. Ask your health care provider how often you should have your eyes checked. Personal lifestyle choices, including: Daily care of  your teeth and gums. Regular physical activity. Eating a healthy diet. Avoiding tobacco and drug use. Limiting alcohol use. Practicing safe sex. Taking low-dose aspirin every day. Taking vitamin and mineral supplements as recommended by your health care provider. What happens during an annual well check? The services and screenings done by your health care provider during your annual well check will depend on your age, overall health, lifestyle risk factors, and family history of disease. Counseling  Your health care provider may ask you questions about your: Alcohol use. Tobacco use. Drug use. Emotional well-being. Home and relationship well-being. Sexual activity. Eating habits. History of falls. Memory and ability to understand (cognition). Work and work Statistician. Reproductive health. Screening  You may have the following tests or measurements: Height, weight, and BMI. Blood pressure. Lipid and cholesterol levels. These may be checked every 5 years, or more frequently if you are over 31 years old. Skin check. Lung cancer screening. You may have this screening every year starting at age 64 if you have a 30-pack-year history of smoking and currently smoke or have quit within the past 15 years. Fecal occult blood test (FOBT) of the stool. You may have this test every year starting at age 37. Flexible sigmoidoscopy or colonoscopy. You may have a sigmoidoscopy every 5 years or a colonoscopy every 10 years starting  at age 8. Hepatitis C blood test. Hepatitis B blood test. Sexually transmitted disease (STD) testing. Diabetes screening. This is done by checking your blood sugar (glucose) after you have not eaten for a while (fasting). You may have this done every 1-3 years. Bone density scan. This is done to screen for osteoporosis. You may have this done starting at age 83. Mammogram. This may be done every 1-2 years. Talk to your health care provider about how often you should  have regular mammograms. Talk with your health care provider about your test results, treatment options, and if necessary, the need for more tests. Vaccines  Your health care provider may recommend certain vaccines, such as: Influenza vaccine. This is recommended every year. Tetanus, diphtheria, and acellular pertussis (Tdap, Td) vaccine. You may need a Td booster every 10 years. Zoster vaccine. You may need this after age 40. Pneumococcal 13-valent conjugate (PCV13) vaccine. One dose is recommended after age 59. Pneumococcal polysaccharide (PPSV23) vaccine. One dose is recommended after age 19. Talk to your health care provider about which screenings and vaccines you need and how often you need them. This information is not intended to replace advice given to you by your health care provider. Make sure you discuss any questions you have with your health care provider. Document Released: 11/25/2015 Document Revised: 07/18/2016 Document Reviewed: 08/30/2015 Elsevier Interactive Patient Education  2017 [employer] Prevention in the Home Falls can cause injuries. They can happen to people of all ages. There are many things you can do to make your home safe and to help prevent falls. What can I do on the outside of my home? Regularly fix the edges of walkways and driveways and fix any cracks. Remove anything that might make you trip as you walk through a door, such as a raised step or threshold. Trim any bushes or trees on the path to your home. Use bright outdoor lighting. Clear any walking paths of anything that might make someone trip, such as rocks or tools. Regularly check to see if handrails are loose or broken. Make sure that both sides of any steps have handrails. Any raised decks and porches should have guardrails on the edges. Have any leaves, snow, or ice cleared regularly. Use sand or salt on walking paths during winter. Clean up any spills in your garage right away. This  includes oil or grease spills. What can I do in the bathroom? Use night lights. Install grab bars by the toilet and in the tub and shower. Do not use towel bars as grab bars. Use non-skid mats or decals in the tub or shower. If you need to sit down in the shower, use a plastic, non-slip stool. Keep the floor dry. Clean up any water that spills on the floor as soon as it happens. Remove soap buildup in the tub or shower regularly. Attach bath mats securely with double-sided non-slip rug tape. Do not have throw rugs and other things on the floor that can make you trip. What can I do in the bedroom? Use night lights. Make sure that you have a light by your bed that is easy to reach. Do not use any sheets or blankets that are too big for your bed. They should not hang down onto the floor. Have a firm chair that has side arms. You can use this for support while you get dressed. Do not have throw rugs and other things on the floor that can make you trip. What can  I do in the kitchen? Clean up any spills right away. Avoid walking on wet floors. Keep items that you use a lot in easy-to-reach places. If you need to reach something above you, use a strong step stool that has a grab bar. Keep electrical cords out of the way. Do not use floor polish or wax that makes floors slippery. If you must use wax, use non-skid floor wax. Do not have throw rugs and other things on the floor that can make you trip. What can I do with my stairs? Do not leave any items on the stairs. Make sure that there are handrails on both sides of the stairs and use them. Fix handrails that are broken or loose. Make sure that handrails are as long as the stairways. Check any carpeting to make sure that it is firmly attached to the stairs. Fix any carpet that is loose or worn. Avoid having throw rugs at the top or bottom of the stairs. If you do have throw rugs, attach them to the floor with carpet tape. Make sure that you  have a light switch at the top of the stairs and the bottom of the stairs. If you do not have them, ask someone to add them for you. What else can I do to help prevent falls? Wear shoes that: Do not have high heels. Have rubber bottoms. Are comfortable and fit you well. Are closed at the toe. Do not wear sandals. If you use a stepladder: Make sure that it is fully opened. Do not climb a closed stepladder. Make sure that both sides of the stepladder are locked into place. Ask someone to hold it for you, if possible. Clearly mark and make sure that you can see: Any grab bars or handrails. First and last steps. Where the edge of each step is. Use tools that help you move around (mobility aids) if they are needed. These include: Canes. Walkers. Scooters. Crutches. Turn on the lights when you go into a dark area. Replace any light bulbs as soon as they burn out. Set up your furniture so you have a clear path. Avoid moving your furniture around. If any of your floors are uneven, fix them. If there are any pets around you, be aware of where they are. Review your medicines with your doctor. Some medicines can make you feel dizzy. This can increase your chance of falling. Ask your doctor what other things that you can do to help prevent falls. This information is not intended to replace advice given to you by your health care provider. Make sure you discuss any questions you have with your health care provider. Document Released: 08/25/2009 Document Revised: 04/05/2016 Document Reviewed: 12/03/2014 Elsevier Interactive Patient Education  2017 [employer].

## 2023-01-07 NOTE — Addendum Note (Signed)
Addended by: Criselda Peaches on: 01/07/2023 04:15 PM   Modules accepted: Orders

## 2023-01-07 NOTE — Progress Notes (Signed)
Subjective:   Rebecca Baird is a 77 y.o. female who presents for Medicare Annual (Subsequent) preventive examination.  Review of Systems    Virtual Visit via Telephone Note  I connected with  Rebecca Baird on 01/07/23 at  3:30 PM EST by telephone and verified that I am speaking with the correct person using two identifiers.  Location: Patient: Home Provider: Office Persons participating in the virtual visit: patient/Nurse Health Advisor   I discussed the limitations, risks, security and privacy concerns of performing an evaluation and management service by telephone and the availability of in person appointments. The patient expressed understanding and agreed to proceed.  Interactive audio and video telecommunications were attempted between this nurse and patient, however failed, due to patient having technical difficulties OR patient did not have access to video capability.  We continued and completed visit with audio only.  Some vital signs may be absent or patient reported.   Criselda Peaches, LPN  Cardiac Risk Factors include: advanced age (>47mn, >>17women);hypertension     Objective:    Today's Vitals   12/28/22 1601  Weight: 173 lb (78.5 kg)  Height: '5\' 5"'$  (1.651 m)   Body mass index is 28.79 kg/m.     12/28/2022    4:17 PM 01/26/2022    5:15 PM 01/15/2022    1:51 PM 11/27/2021   11:34 AM 10/17/2021    2:45 PM 12/14/2019    2:00 PM  Advanced Directives  Does Patient Have a Medical Advance Directive? No No No No No No  Would patient like information on creating a medical advance directive? No - Patient declined No - Patient declined   No - Patient declined     Current Medications (verified) Outpatient Encounter Medications as of 12/28/2022  Medication Sig   alendronate (FOSAMAX) 70 MG tablet TAKE 1 TABLET BY MOUTH EVERY WEEK. TAKE WITH A FULL GLASS OF WATER ON AN EMPTY STOMACH.   Ascorbic Acid (VITAMIN C) 1000 MG tablet Take 1,000 mg by mouth  daily.   aspirin EC 81 MG tablet Take 81 mg by mouth daily. Swallow whole.   Cholecalciferol (VITAMIN D3) 125 MCG (5000 UT) CAPS Take 1 capsule by mouth daily.   desonide (DESOWEN) 0.05 % cream APPLY TO FACE TOPICALLY DAILY AS NEEDED (Patient taking differently: Apply 1 application  topically daily as needed (rash on face).)   folic acid (FOLVITE) [id number] tablet Take 400 mcg by mouth daily.   furosemide (LASIX) 20 MG tablet TAKE 1 TABLET BY MOUTH EVERY DAY   gabapentin (NEURONTIN) 300 MG capsule TAKE 1 CAPSULE BY MOUTH TWICE A DAY   losartan (COZAAR) 25 MG tablet TAKE 1 TABLET (25 MG TOTAL) BY MOUTH DAILY.   metoprolol succinate (TOPROL-XL) 25 MG 24 hr tablet Take 1 tablet (25 mg total) by mouth daily.   NUCYNTA ER 200 MG TB12 Take 200 mg by mouth 2 (two) times daily.   omeprazole (PRILOSEC) 20 MG capsule TAKE 1 CAPSULE BY MOUTH EVERY DAY   pyridOXINE (VITAMIN B-6) 100 MG tablet Take 100 mg by mouth daily.   rosuvastatin (CRESTOR) 10 MG tablet TAKE 1 TABLET BY MOUTH EVERY DAY   tapentadol (NUCYNTA) 100 MG 12 hr tablet Take 200 mg by mouth every 12 (twelve) hours.   thiamine (VITAMIN B-1) 100 MG tablet Take 100 mg by mouth daily.   triamcinolone cream (KENALOG) 0.1 % APPLY TO LEGS TOPICALLY TWICE A DAY AS NEEDED (Patient taking differently: Apply 1 application  topically  2 (two) times daily as needed (rash on legs).)   No facility-administered encounter medications on file as of 12/28/2022.    Allergies (verified) Epinephrine   History: Past Medical History:  Diagnosis Date   Anxiety    Depression    GERD (gastroesophageal reflux disease)    Hypertension    Peripheral neuropathy    Past Surgical History:  Procedure Laterality Date   broken ankle     cataract surgery     NOSE SURGERY     Family History  Problem Relation Age of Onset   Breast cancer Mother    Breast cancer Sister 31   Breast cancer Cousin    Social History   Socioeconomic History   Marital status: Married     Spouse name: Not on file   Number of children: 1   Years of education: Not on file   Highest education level: Associate degree: academic program  Occupational History   Occupation: retired  Tobacco Use   Smoking status: Former    Types: Cigarettes    Quit date: 1984    Years since quitting: 40.1   Smokeless tobacco: Never  Vaping Use   Vaping Use: Never used  Substance and Sexual Activity   Alcohol use: Yes    Alcohol/week: 3.0 standard drinks of alcohol    Types: 3 Glasses of wine per week    Comment: Drinks Wine   Drug use: Yes    Types: Other-see comments    Comment: nucytha   Sexual activity: Not on file  Other Topics Concern   Not on file  Social History Narrative   Right handed   One story home   Drinks occasional caffeine   Social Determinants of Health   Financial Resource Strain: Low Risk  (12/28/2022)   Overall Financial Resource Strain (CARDIA)    Difficulty of Paying Living Expenses: Not hard at all  Food Insecurity: No Food Insecurity (12/28/2022)   Hunger Vital Sign    Worried About Running Out of Food in the Last Year: Never true    Ran Out of Food in the Last Year: Never true  Transportation Needs: No Transportation Needs (12/28/2022)   PRAPARE - Hydrologist (Medical): No    Lack of Transportation (Non-Medical): No  Physical Activity: Sufficiently Active (12/28/2022)   Exercise Vital Sign    Days of Exercise per Week: 5 days    Minutes of Exercise per Session: 30 min  Stress: No Stress Concern Present (12/28/2022)   [place]    Feeling of Stress : Not at all  Social Connections: [place] (12/28/2022)   Social Connection and Isolation Panel [NHANES]    Frequency of Communication with Friends and Family: More than three times a week    Frequency of Social Gatherings with Friends and Family: More than three times a week    Attends Religious  Services: More than 4 times per year    Active Member of [employer] or Organizations: Yes    Attends Music therapist: More than 4 times per year    Marital Status: Married    Tobacco Counseling Counseling given: Not Answered   Clinical Intake:  Pre-visit preparation completed: Yes  Pain : No/denies pain     BMI - recorded: 28.79 Nutritional Status: BMI 25 -29 Overweight Nutritional Risks: None Diabetes: No  How often do you need to have someone help you when you read instructions, pamphlets,  or other written materials from your doctor or pharmacy?: 1 - Never  Diabetic?  Interpreter Needed?: No  Information entered by :: Rolene Arbour LPN   Activities of Daily Living    12/28/2022    4:13 PM  In your present state of health, do you have any difficulty performing the following activities:  Hearing? 0  Vision? 0  Difficulty concentrating or making decisions? 0  Walking or climbing stairs? 1  Comment Uses a cane , walker and wheelchair  Dressing or bathing? 0  Doing errands, shopping? 1  Comment Husband assist  Preparing Food and eating ? Y  Comment Husband assist  Using the Toilet? N  In the past six months, have you accidently leaked urine? N  Do you have problems with loss of bowel control? N  Managing your Medications? N  Managing your Finances? N  Housekeeping or managing your Housekeeping? N    Patient Care Team: [place], Betty G, MD as PCP - General (Family Medicine) Alda Berthold, DO as Consulting Physician (Neurology)  Indicate any recent Medical Services you may have received from other than [facility] providers in the past year (date may be approximate).     Assessment:   This is a routine wellness examination for [place].  Hearing/Vision screen Hearing Screening - Comments:: Denies hearing difficulties   Vision Screening - Comments:: Wears rx glasses - up to date with routine eye exams with  [place] issues and  exercise activities discussed: Exercise limited by: None identified   Goals Addressed               This Visit's Progress     Stay Healthy (pt-stated)         Depression Screen    01/07/2023    4:07 PM 08/27/2022    1:54 PM 05/25/2022    2:34 PM 01/23/2022    2:28 PM 11/27/2021   11:36 AM 11/08/2021    2:25 PM 10/09/2021   11:54 AM  PHQ 2/9 Scores  PHQ - 2 Score 0 0 0 0 0 0 2  PHQ- 9 Score  0 0 0   7    Fall Risk    12/28/2022    4:17 PM 08/27/2022    1:54 PM 01/15/2022    1:51 PM 11/27/2021   11:35 AM 11/08/2021    8:09 PM  Fall Risk   Falls in the past year? 0 0 1 0 1  Number falls in past yr: 0 0 0  1  Injury with Fall? 0 0 0  0  Risk for fall due to : No Fall Risks History of fall(s)  Impaired balance/gait;Impaired mobility;Medication side effect Impaired mobility;Impaired balance/gait;History of fall(s)  Follow up Falls prevention discussed Falls evaluation completed  Falls evaluation completed;Education provided;Falls prevention discussed Education provided    FALL RISK PREVENTION PERTAINING TO THE HOME:  Any stairs in or around the home?  If so, are there any without handrails?  Home free of loose throw rugs in walkways, pet beds, electrical cords, etc?  Adequate lighting in your home to reduce risk of falls?   ASSISTIVE DEVICES UTILIZED TO PREVENT FALLS:  Life alert?  Use of a cane, walker or w/c?  Grab bars in the bathroom?  Shower chair or bench in shower?  Elevated toilet seat or a handicapped toilet?   TIMED UP AND GO:  Was the test performed? .  Length of time to ambulate 10 feet:  sec.  Cognitive Function:        12/28/2022    4:17 PM 11/27/2021   11:41 AM 11/17/2020    3:13 PM  6CIT Screen  What Year? 0 points 0 points 0 points  What month? 0 points 0 points 0 points  What time? 0 points 0 points 0 points  Count back from 20 0 points 0 points 0 points  Months in reverse 0 points 0 points 0 points  Repeat phrase 0 points 2 points 0  points  Total Score 0 points 2 points 0 points    Immunizations Immunization History  Administered Date(s) Administered   Fluad Quad(high Dose 65+) 08/03/2020, 07/17/2022   Influenza, High Dose Seasonal PF 07/07/2019, 08/02/2021   Influenza,inj,Quad PF,6+ Mos 07/27/2018   PFIZER Comirnaty(Gray Top)Covid-19 Tri-Sucrose Vaccine 03/03/2021, 08/13/2022   PFIZER(Purple Top)SARS-COV-2 Vaccination 01/03/2020, 01/27/2020   Pfizer Covid-19 Vaccine Bivalent Booster 4yr & up 08/18/2021   Pneumococcal Polysaccharide-23 12/18/2020   Tdap 12/14/2020   Zoster Recombinat (Shingrix) 12/05/2020, 03/11/2021            Qualifies for Shingles Vaccine?   Zostavax completed     Screening Tests Health Maintenance  Topic Date Due   COVID-19 Vaccine (6 - 2023-24 season) 01/13/2023 (Originally 10/08/2022)   Pneumonia Vaccine 77 Years old (2 of 2 - PCV) 12/29/2023 (Originally 12/18/2021)   Medicare Annual Wellness (AWV)  12/29/2023   DTaP/Tdap/Td (2 - Td or Tdap) 12/14/2030   INFLUENZA VACCINE  Completed   DEXA SCAN  Completed   Hepatitis C Screening  Completed   Zoster Vaccines- Shingrix  Completed   HPV VACCINES  Aged Out   COLONOSCOPY (Pts 45-467yrInsurance coverage will need to be confirmed)  Discontinued    Health Maintenance  There are no preventive care reminders to display for this patient.        Lung Cancer Screening: (Low Dose CT Chest recommended if Age 77-80ears, 30 pack-year currently smoking OR have quit w/in 15years.)  qualify.   Lung Cancer Screening Referral:   Additional Screening:  Hepatitis C Screening:  qualify; Completed   Vision Screening: Recommended annual ophthalmology exams for early detection of glaucoma and other disorders of the eye. Is the patient up to date with their annual eye exam?   Who is the provider or what is the name of the office in which the patient attends annual eye exams?  If pt is not established with a provider, would they  like to be referred to a provider to establish care? .   Dental Screening: Recommended annual dental exams for proper oral hygiene  Community Resource Referral / Chronic Care Management: CRR required this visit?    CCM required this visit?       Plan:     I have personally reviewed and noted the following in the patient's chart:   Medical and social history Use of alcohol, tobacco or illicit drugs  Current medications and supplements including opioid prescriptions.  Functional ability and status Nutritional status Physical activity Advanced directives List of other physicians Hospitalizations, surgeries, and ER visits in previous 12 months Vitals Screenings to include cognitive, depression, and falls Referrals and appointments  In addition, I have reviewed and discussed with patient certain preventive protocols, quality metrics, and best practice recommendations. A written personalized care plan for preventive services as well as general preventive health recommendations were provided to patient.     BeCriselda PeachesLPN   [id number] Nurse Notes: This note is an addendum  to AWV visit completed on 12/28/22. Depression Screening was completed on 12/28/22 with score of 0.also 6CIT Screening was completed 12/28/22 with score 0.

## 2023-01-10 DIAGNOSIS — M5416 Radiculopathy, lumbar region: Secondary | ICD-10-CM | POA: Diagnosis not present

## 2023-01-10 DIAGNOSIS — G8929 Other chronic pain: Secondary | ICD-10-CM | POA: Diagnosis not present

## 2023-03-07 DIAGNOSIS — G8929 Other chronic pain: Secondary | ICD-10-CM | POA: Diagnosis not present

## 2023-03-07 DIAGNOSIS — M5416 Radiculopathy, lumbar region: Secondary | ICD-10-CM | POA: Diagnosis not present

## 2023-03-28 ENCOUNTER — Other Ambulatory Visit: Payer: Self-pay | Admitting: Family Medicine

## 2023-03-28 DIAGNOSIS — Z1231 Encounter for screening mammogram for malignant neoplasm of breast: Secondary | ICD-10-CM

## 2023-04-17 ENCOUNTER — Ambulatory Visit
Admission: RE | Admit: 2023-04-17 | Discharge: 2023-04-17 | Disposition: A | Discharge status: Home / Self Care | Payer: Medicare PPO | Source: Ambulatory Visit | Attending: Family Medicine | Admitting: Family Medicine

## 2023-04-17 DIAGNOSIS — Z1231 Encounter for screening mammogram for malignant neoplasm of breast: Secondary | ICD-10-CM | POA: Diagnosis not present

## 2023-04-19 ENCOUNTER — Other Ambulatory Visit: Payer: Self-pay | Admitting: Family Medicine

## 2023-05-09 DIAGNOSIS — M5416 Radiculopathy, lumbar region: Secondary | ICD-10-CM | POA: Diagnosis not present

## 2023-05-09 DIAGNOSIS — G8929 Other chronic pain: Secondary | ICD-10-CM | POA: Diagnosis not present

## 2023-05-10 ENCOUNTER — Other Ambulatory Visit: Payer: Self-pay | Admitting: Family Medicine

## 2023-05-12 IMAGING — MR MR LUMBAR SPINE W/O CM
4 of 5 series · 27 of 48 positions shown · non-contrast
Comparison: None.

CLINICAL DATA: Low back pain

EXAM:
MRI LUMBAR SPINE WITHOUT CONTRAST
TECHNIQUE: Multiplanar, multisequence MR imaging of the lumbar spine was
performed. No intravenous contrast was administered.

[Series 3: T2 · sagittal · 4.0mm · 1.09mm/px · 6 of 17 slices shown (1 of 2)]
[im 1/17]
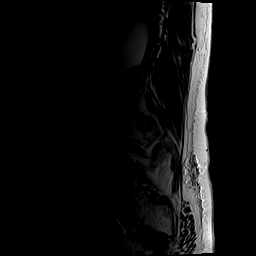
[im 4/17]
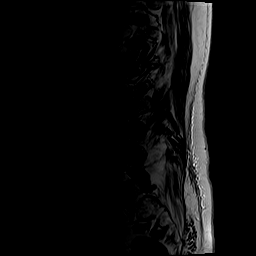
[im 7/17]
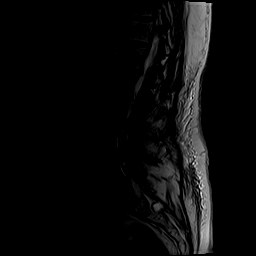
[im 10/17]
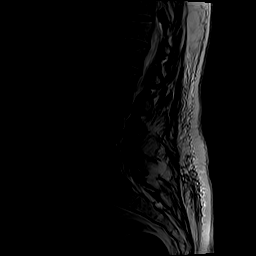
[im 13/17]
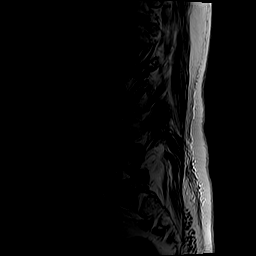
[im 17/17]
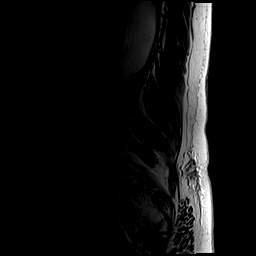

[Series 5: T1 · sagittal · 4.0mm · 1.09mm/px · 6 of 17 slices shown (1 of 2)]
[im 1/17]
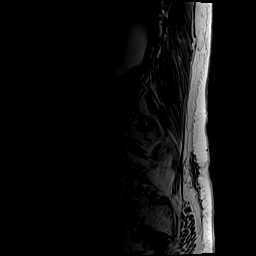
[im 4/17]
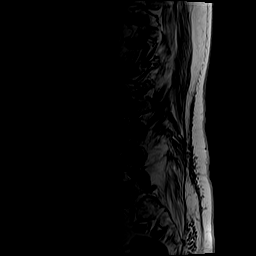
[im 7/17]
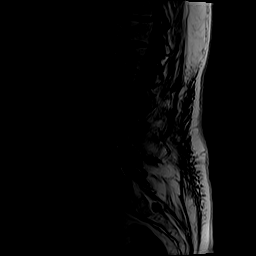
[im 10/17]
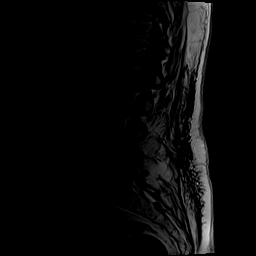
[im 13/17]
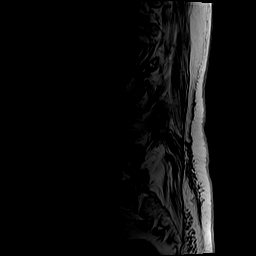
[im 17/17]
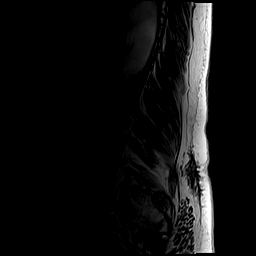

[Series 6: T2 · axial · 4.0mm · 0.39mm/px · z∈[-88,+136]mm · 9 of 42 slices shown (2 of 2)]
[im 1/42]
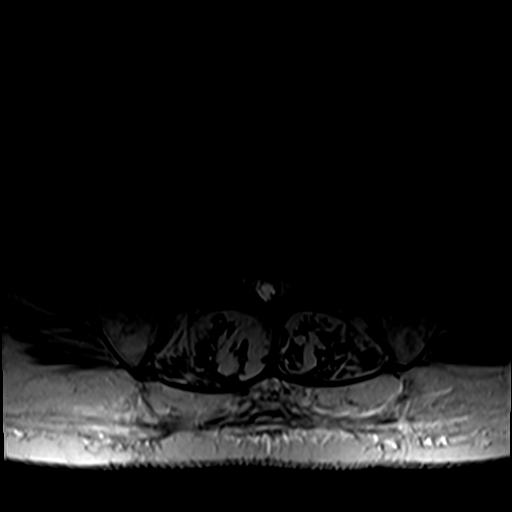
[im 6/42]
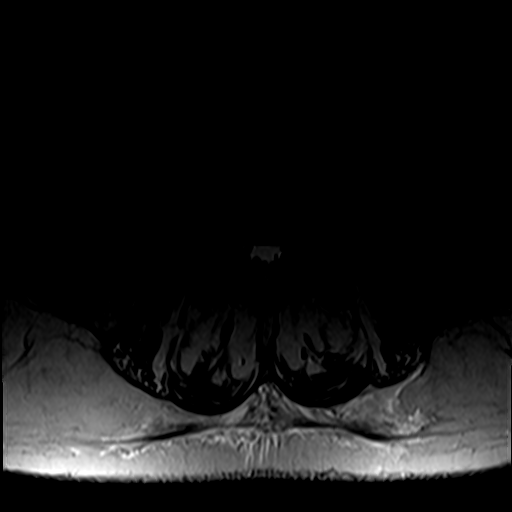
[im 12/42]
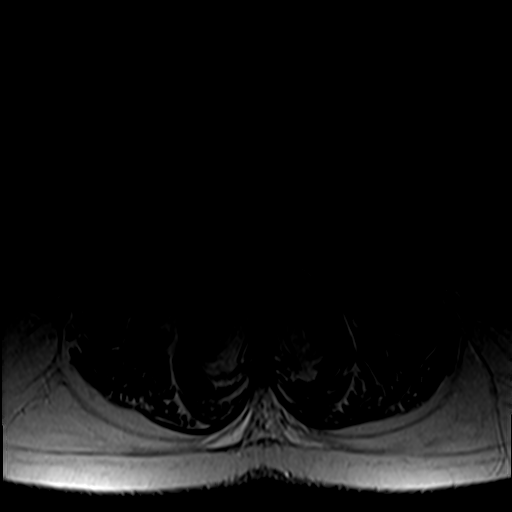
[im 18/42]
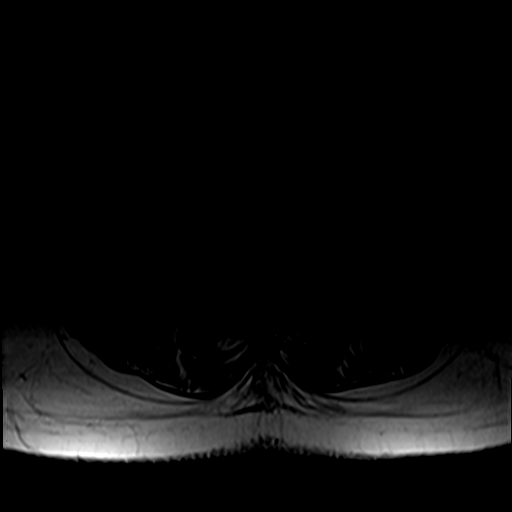
[im 21/42]
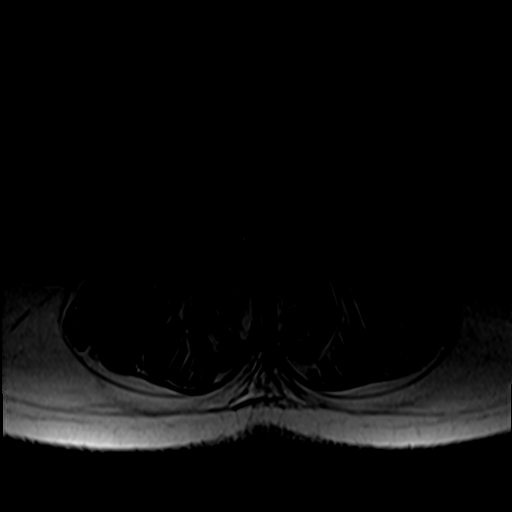
[im 24/42]
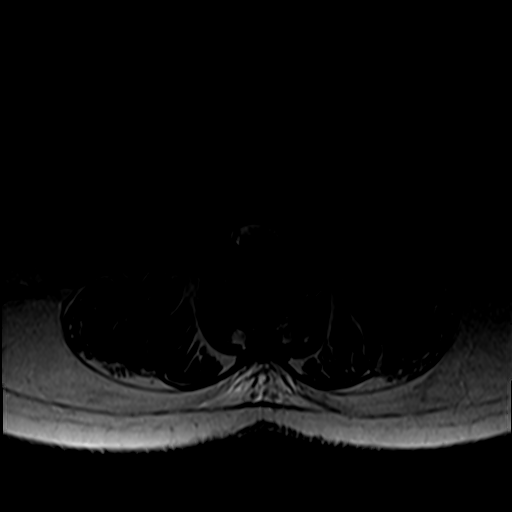
[im 30/42]
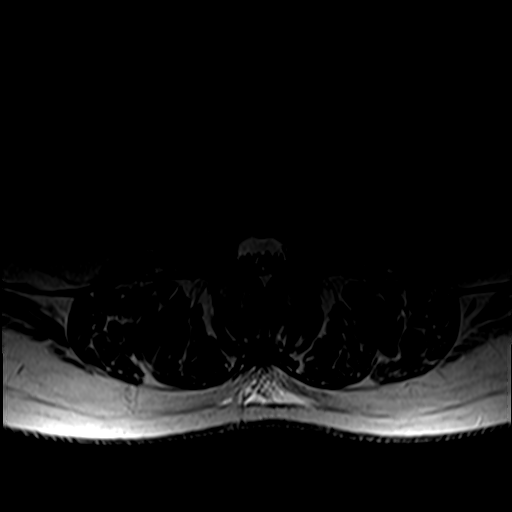
[im 36/42]
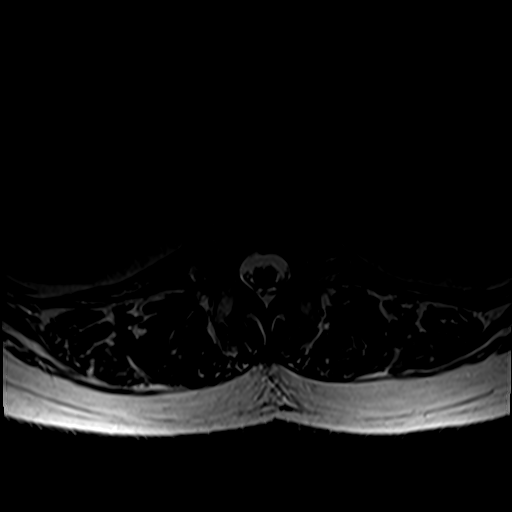
[im 42/42]
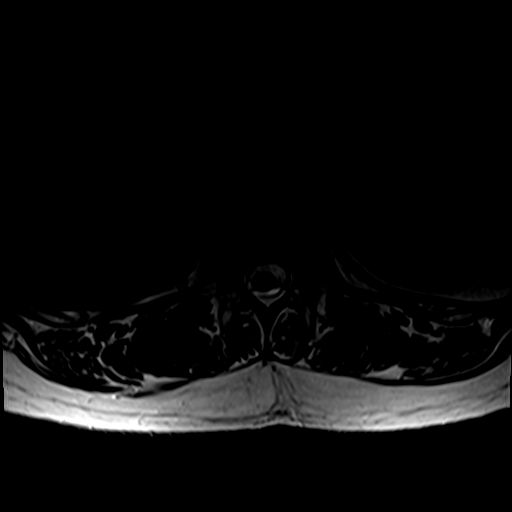

[Series 8: T1 · axial · 4.0mm · 0.39mm/px · z∈[-88,+107]mm · 6 of 42 slices shown (2 of 2)]
[im 1/42]
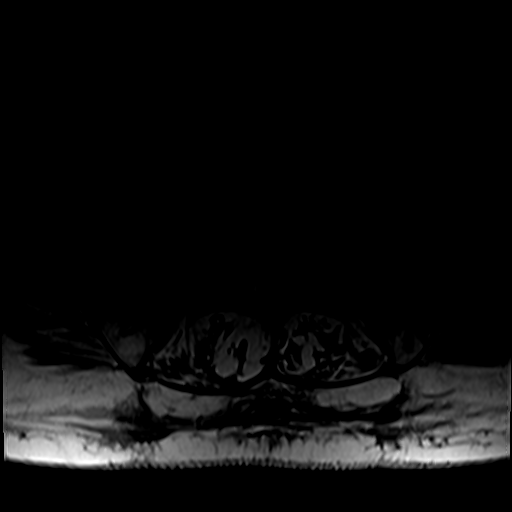
[im 6/42]
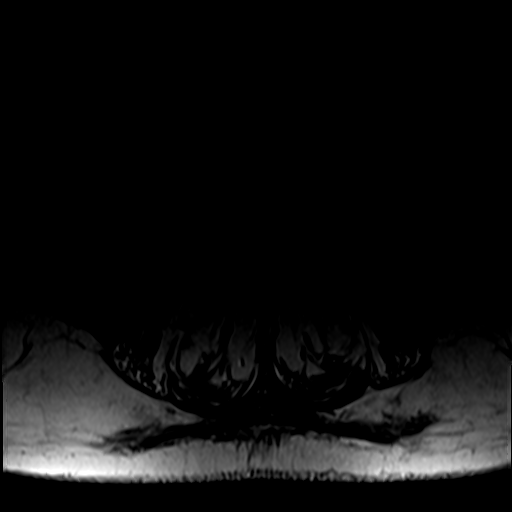
[im 12/42]
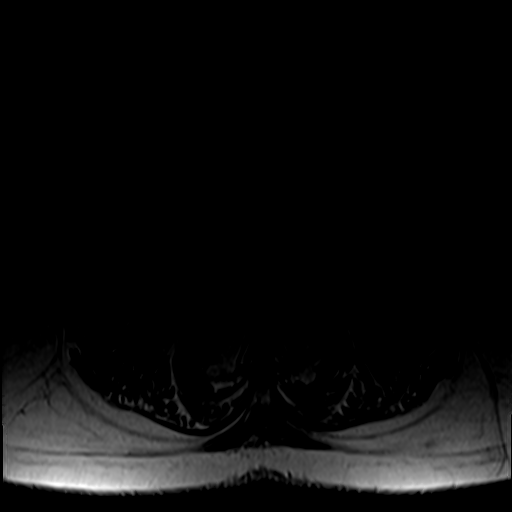
[im 18/42]
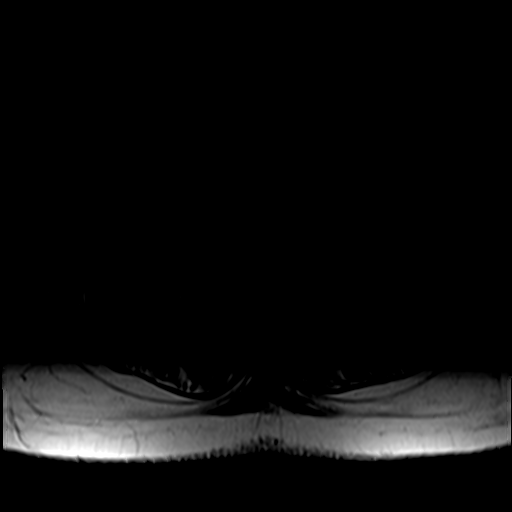
[im 21/42]
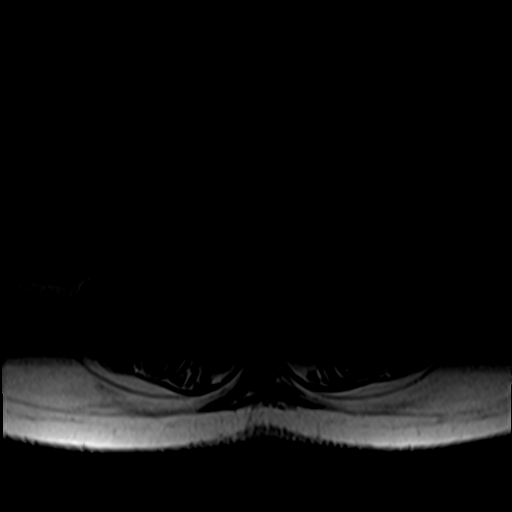
[im 36/42]
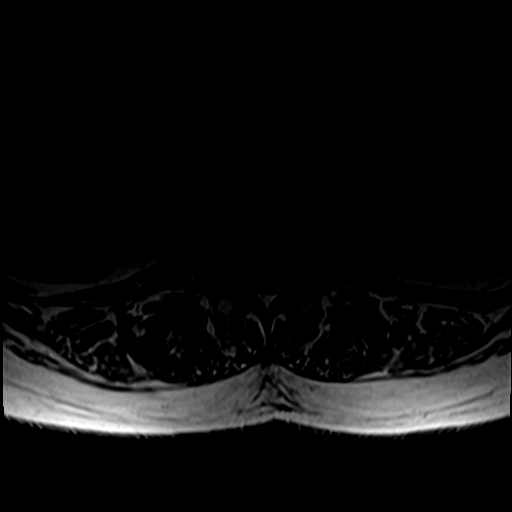

[27 of 48 positions shown; findings below may reference images not displayed]

FINDINGS: Segmentation:  Standard.

Alignment:  Grade 1 retrolisthesis at L2-3 and L3-4.

Vertebrae:  Mild anterior height loss at L1.  No acute abnormality.

Conus medullaris and cauda equina: Conus extends to the L2 level.
Conus and cauda equina appear normal.

Paraspinal and other soft tissues: Fatty atrophy of the paraspinous
muscles.

Disc levels:

T12-L1: Unremarkable.

L1-L2: Minimal disc bulge. No spinal canal stenosis. No neural
foraminal stenosis.

L2-L3: Small left subarticular disc protrusion. Left lateral recess
narrowing without central spinal canal stenosis. No neural foraminal
stenosis.

L3-L4: Small disc bulge. No spinal canal stenosis. No neural
foraminal stenosis.

L4-L5: Moderate facet hypertrophy with small disc bulge. Mild spinal
canal stenosis. No neural foraminal stenosis.

L5-S1: Moderate facet hypertrophy. No spinal canal stenosis. No
neural foraminal stenosis.

Visualized sacrum: Normal.
IMPRESSION: 1. Mild spinal canal stenosis at L4-L5 due to combination of disc
bulge and facet arthrosis.
2. Small left subarticular disc protrusion at L2-L3 narrowing the
left lateral recess. Correlate for left L3 radiculopathy.
3. Moderate facet arthrosis at L4-5 and L5-S[DATE] be a source of
local low back pain.

## 2023-06-04 DIAGNOSIS — Z0000 Encounter for general adult medical examination without abnormal findings: Secondary | ICD-10-CM | POA: Insufficient documentation

## 2023-06-04 NOTE — Patient Instructions (Incomplete)
A few things to remember from today's visit:  Routine general medical examination at a health care facility  Hyperlipemia, mixed  Benign essential hypertension  If you need refills for medications you take chronically, please call your pharmacy. Do not use My Chart to request refills or for acute issues that need immediate attention. If you send a my chart message, it may take a few days to be addressed, specially if I am not in the office.  Please be sure medication list is accurate. If a new problem present, please set up appointment sooner than planned today.

## 2023-06-04 NOTE — Progress Notes (Signed)
HPI: Rebecca Baird is a 77 y.o. female with PMHx significant for chronic pain, peripheral neuropathy, PAD, diastolic dysfunction,alcohol dependency, anxiety,CKD III, and HTN here today for her routine physical.  Last CPE: AWV 12/28/22  She reports no regular physical activity and an inconsistent diet, she does not eat vegetable daily, during summer she is eating tomatoes daily. She is not a great meat eater but enjoys fish and occasionally eats chicken. She has a history of smoking but quit in 1984. She consumes at least two glasses of white wine daily, with each glass containing approximately four to five ounces. She reports getting a good eight hours of sleep per night.  Immunization History  Administered Date(s) Administered   Covid-19, Mrna,Vaccine(Spikevax)66yrs and older 04/10/2023   Fluad Quad(high Dose 65+) 08/03/2020, 07/17/2022   Influenza, High Dose Seasonal PF 07/07/2019, 08/02/2021   Influenza,inj,Quad PF,6+ Mos 07/27/2018   PFIZER Comirnaty(Gray Top)Covid-19 Tri-Sucrose Vaccine 03/03/2021, 08/13/2022   PFIZER(Purple Top)SARS-COV-2 Vaccination 01/03/2020, 01/27/2020   Pfizer Covid-19 Vaccine Bivalent Booster 46yrs & up 08/18/2021   Pneumococcal Polysaccharide-23 12/18/2020   Tdap 12/14/2020   Zoster Recombinant(Shingrix) 12/05/2020, 03/11/2021   Health Maintenance  Topic Date Due   COVID-19 Vaccine (7 - 2023-24 season) 06/21/2023 (Originally 06/05/2023)   Pneumonia Vaccine 57+ Years old (2 of 2 - PCV) 12/29/2023 (Originally 12/18/2021)   INFLUENZA VACCINE  06/13/2023   Medicare Annual Wellness (AWV)  12/29/2023   DTaP/Tdap/Td (2 - Td or Tdap) 12/14/2030   DEXA SCAN  Completed   Hepatitis C Screening  Completed   Zoster Vaccines- Shingrix  Completed   HPV VACCINES  Aged Out   Colonoscopy  Discontinued   She had a fall about a month ago in [place], no serious injury. Osteoporosis: She takes Fosamax weekly.  Hypertension:  Medications: Metoprolol  Succinate 25 mg daily and Losartan 25 mg daily. Takes Furosemide 20 mg daily for LE edema. Lab Results  Component Value Date   CREATININE 1.22 (H) 08/27/2022   BUN 16 08/27/2022   NA 135 08/27/2022   K 4.0 08/27/2022   CL 98 08/27/2022   CO2 27 08/27/2022   Hyperlipidemia: Currently on Crestor 10 mg daily.  Lab Results  Component Value Date   CHOL 213 (H) 06/27/2021   HDL 79.40 06/27/2021   LDLCALC 97 06/27/2021   TRIG 184.0 (H) 06/27/2021   CHOLHDL 3 06/27/2021   Chronic pain: She is currently seeing a pain management provider every 2 months.  Peripheral neuropathy, alcohol related. She would like to see another neurologist in [place] for her neuropathy. She has had a negative experience with a previous neurologist. She is on Gabapentin 300 mg bid. She also takes Thiamine 100 mg daily.  Takes Miralax daily for constipation, which has helped.  Review of Systems  Constitutional:  Negative for activity change, appetite change and fever.  HENT:  Negative for hearing loss, mouth sores, sore throat and trouble swallowing.   Eyes:  Negative for redness and visual disturbance.  Respiratory:  Negative for cough, shortness of breath and wheezing.   Cardiovascular:  Negative for chest pain and leg swelling.  Gastrointestinal:  Negative for abdominal pain, nausea and vomiting.  Endocrine: Negative for cold intolerance, heat intolerance, polydipsia, polyphagia and polyuria.  Genitourinary:  Negative for decreased urine volume, dysuria and hematuria.  Musculoskeletal:  Positive for arthralgias, gait problem and myalgias.  Skin:  Negative for color change and rash.  Allergic/Immunologic: Negative for environmental allergies.  Neurological:  Negative for syncope and headaches.  Psychiatric/Behavioral:  Negative for confusion and hallucinations. The patient is nervous/anxious.   All other systems reviewed and are negative.  Current Outpatient Medications on File Prior to Visit   Medication Sig Dispense Refill   alendronate (FOSAMAX) 70 MG tablet TAKE 1 TABLET BY MOUTH EVERY WEEK. TAKE WITH A FULL GLASS OF WATER ON AN EMPTY STOMACH. 12 tablet 2   Ascorbic Acid (VITAMIN C) 1000 MG tablet Take 1,000 mg by mouth daily.     aspirin EC 81 MG tablet Take 81 mg by mouth daily. Swallow whole.     Cholecalciferol (VITAMIN D3) 125 MCG (5000 UT) CAPS Take 1 capsule by mouth daily.     desonide (DESOWEN) 0.05 % cream APPLY TO FACE TOPICALLY DAILY AS NEEDED (Patient taking differently: Apply 1 application  topically daily as needed (rash on face).) 30 g 1   folic acid (FOLVITE) 800 MCG tablet Take 400 mcg by mouth daily.     furosemide (LASIX) 20 MG tablet TAKE 1 TABLET BY MOUTH EVERY DAY 90 tablet 1   gabapentin (NEURONTIN) 300 MG capsule TAKE 1 CAPSULE BY MOUTH TWICE A DAY 60 capsule 5   losartan (COZAAR) 25 MG tablet TAKE 1 TABLET (25 MG TOTAL) BY MOUTH DAILY. 90 tablet 2   metoprolol succinate (TOPROL-XL) 25 MG 24 hr tablet Take 1 tablet (25 mg total) by mouth daily. 90 tablet 2   NUCYNTA ER 200 MG TB12 Take 200 mg by mouth 2 (two) times daily.     omeprazole (PRILOSEC) 20 MG capsule TAKE 1 CAPSULE BY MOUTH EVERY DAY 90 capsule 1   pyridOXINE (VITAMIN B-6) 100 MG tablet Take 100 mg by mouth daily.     rosuvastatin (CRESTOR) 10 MG tablet TAKE 1 TABLET BY MOUTH EVERY DAY 90 tablet 2   tapentadol (NUCYNTA) 100 MG 12 hr tablet Take 200 mg by mouth every 12 (twelve) hours.     thiamine (VITAMIN B-1) 100 MG tablet Take 100 mg by mouth daily.     triamcinolone cream (KENALOG) 0.1 % APPLY TO LEGS TOPICALLY TWICE A DAY AS NEEDED (Patient taking differently: Apply 1 application  topically 2 (two) times daily as needed (rash on legs).) 30 g 2   No current facility-administered medications on file prior to visit.    Past Medical History:  Diagnosis Date   Anxiety    Depression    GERD (gastroesophageal reflux disease)    Hypertension    Peripheral neuropathy     Past Surgical  History:  Procedure Laterality Date   broken ankle     cataract surgery     NOSE SURGERY      Allergies  Allergen Reactions   Epinephrine Other (See Comments) and Palpitations    "Heart Races Uncomfortably"     Family History  Problem Relation Age of Onset   Breast cancer Mother    Breast cancer Sister 47   Breast cancer Cousin    Social History   Socioeconomic History   Marital status: Married    Spouse name: Not on file   Number of children: 1   Years of education: Not on file   Highest education level: Associate degree: academic program  Occupational History   Occupation: retired  Tobacco Use   Smoking status: Former    Current packs/day: 0.00    Types: Cigarettes    Quit date: 1984    Years since quitting: 40.5   Smokeless tobacco: Never  Vaping Use   Vaping status: Never Used  Substance and  Sexual Activity   Alcohol use: Yes    Alcohol/week: 3.0 standard drinks of alcohol    Types: 3 Glasses of wine per week    Comment: Drinks Wine   Drug use: Yes    Types: Other-see comments    Comment: nucytha   Sexual activity: Not on file  Other Topics Concern   Not on file  Social History Narrative   Right handed   One story home   Drinks occasional caffeine   Social Determinants of Health   Financial Resource Strain: Low Risk  (12/28/2022)   Overall Financial Resource Strain (CARDIA)    Difficulty of Paying Living Expenses: Not hard at all  Food Insecurity: No Food Insecurity (12/28/2022)   Hunger Vital Sign    Worried About Running Out of Food in the Last Year: Never true    Ran Out of Food in the Last Year: Never true  Transportation Needs: No Transportation Needs (12/28/2022)   PRAPARE - Administrator, Civil Service (Medical): No    Lack of Transportation (Non-Medical): No  Physical Activity: Sufficiently Active (12/28/2022)   Exercise Vital Sign    Days of Exercise per Week: 5 days    Minutes of Exercise per Session: 30 min  Stress: No  Stress Concern Present (12/28/2022)   [employer] of Occupational Health - Occupational Stress Questionnaire    Feeling of Stress : Not at all  Social Connections: Socially Integrated (12/28/2022)   Social Connection and Isolation Panel [NHANES]    Frequency of Communication with Friends and Family: More than three times a week    Frequency of Social Gatherings with Friends and Family: More than three times a week    Attends Religious Services: More than 4 times per year    Active Member of Clubs or Organizations: Yes    Attends Banker Meetings: More than 4 times per year    Marital Status: Married   Vitals:   06/05/23 0947  BP: 128/80  Pulse: 95  Resp: 16  Temp: 98.8 F (37.1 C)  SpO2: 95%   Body mass index is 28.64 kg/m.  Wt Readings from Last 3 Encounters:  06/05/23 172 lb 2 oz (78.1 kg)  12/28/22 173 lb (78.5 kg)  10/30/22 173 lb (78.5 kg)   Physical Exam Vitals and nursing note reviewed.  Constitutional:      General: She is not in acute distress.    Appearance: She is well-developed.  HENT:     Head: Normocephalic and atraumatic.     Right Ear: Tympanic membrane, ear canal and external ear normal.     Left Ear: Tympanic membrane, ear canal and external ear normal.     Mouth/Throat:     Mouth: Mucous membranes are moist.     Pharynx: Oropharynx is clear. Uvula midline.  Eyes:     Conjunctiva/sclera: Conjunctivae normal.     Pupils: Pupils are equal, round, and reactive to light.  Neck:     Thyroid: No thyroid mass.  Cardiovascular:     Rate and Rhythm: Normal rate and regular rhythm.     Heart sounds: Murmur (SEM II/VI RUSB and LUSB) heard.     Comments: PT pulses palpable. Pulmonary:     Effort: Pulmonary effort is normal. No respiratory distress.     Breath sounds: Normal breath sounds.  Abdominal:     Palpations: Abdomen is soft. There is no mass.     Tenderness: There is no abdominal tenderness.  Musculoskeletal:  Right lower leg:  No edema.     Left lower leg: No edema.     Comments: No major deformity or signs of synovitis appreciated.  Lymphadenopathy:     Cervical: No cervical adenopathy.  Skin:    General: Skin is warm.     Findings: No erythema or rash.     Comments: A few scattered SK. No suspicious lesions.  Neurological:     General: No focal deficit present.     Mental Status: She is alert. Mental status is at baseline.     Cranial Nerves: No cranial nerve deficit.     Deep Tendon Reflexes:     Reflex Scores:      Bicep reflexes are 2+ on the right side and 2+ on the left side.      Patellar reflexes are 2+ on the right side and 2+ on the left side.    Comments: Unstable gait, assisted with a cane. Decrease feet sensation, bilateral. Normal sensation upper extremities.  Psychiatric:        Mood and Affect: Mood and affect normal.   ASSESSMENT AND PLAN: Ms. Jannet Calip was here today annual physical examination.  Orders Placed This Encounter  Procedures   Microalbumin / creatinine urine ratio   Basic metabolic panel   Lipid panel   Lab Results  Component Value Date   CHOL 189 06/05/2023   HDL 56.60 06/05/2023   LDLCALC 97 06/27/2021   LDLDIRECT 113.0 06/05/2023   TRIG 213.0 (H) 06/05/2023   CHOLHDL 3 06/05/2023   Lab Results  Component Value Date   NA 135 06/05/2023   CL 99 06/05/2023   K 4.0 06/05/2023   CO2 28 06/05/2023   BUN 13 06/05/2023   CREATININE 1.01 06/05/2023   GFR 53.87 (L) 06/05/2023   CALCIUM 9.3 06/05/2023   ALBUMIN 4.4 08/27/2022   GLUCOSE 92 06/05/2023   Lab Results  Component Value Date   MICROALBUR <0.7 06/05/2023   MICROALBUR <0.2 05/25/2022   Routine general medical examination at a health care facility Assessment & Plan: We discussed the importance of regular physical activity as tolerated and healthy diet for prevention of chronic illness and/or complications. Fall precautions. Preventive guidelines reviewed. Vaccination up to  date. Ca++ and vit D supplementation to continue. Next CPE in a year.   Hyperlipemia, mixed Assessment & Plan: Last LDL 97 in 06/2021. Continue Rosuvastatin 10 mg daily and low fat diet.  Orders: -     Lipid panel; Future  Benign essential hypertension Assessment & Plan: BP adequately controlled. Continue Metoprolol succinate 25 mg daily and Losartan 25 mg daily as well as low salt diet.   Other polyneuropathy Assessment & Plan: We discussed D and prognosis. Continue Gabapentin 600 mg bid. Stressed the importance of adequate skin care and fall prevention.   Stage 3b chronic kidney disease (HCC) Assessment & Plan: Cr 1.1-1.4 and e GFR mid 30's to 40's. Continue adequate hydration,low salt diet and avoidance of NSAID's. Continue Losartan 25 mg daily.  Orders: -     Microalbumin / creatinine urine ratio; Future -     Basic metabolic panel; Future  Uncomplicated alcohol dependence (HCC) Assessment & Plan: She continues drinking alcohol. We discussed recommendations in regard to alcohol intake, <4 oz.   Other orders -     LDL cholesterol, direct  Return in 6 months (on 12/06/2023) for chronic problems.  Savahna Casados G. [place], MD  [facility]. [place] office.

## 2023-06-05 ENCOUNTER — Ambulatory Visit (INDEPENDENT_AMBULATORY_CARE_PROVIDER_SITE_OTHER): Payer: Medicare PPO | Admitting: Family Medicine

## 2023-06-05 ENCOUNTER — Encounter: Payer: Self-pay | Admitting: Family Medicine

## 2023-06-05 VITALS — BP 128/80 | HR 95 | Temp 98.8°F | Resp 16 | Ht 65.0 in | Wt 172.1 lb

## 2023-06-05 DIAGNOSIS — E782 Mixed hyperlipidemia: Secondary | ICD-10-CM | POA: Diagnosis not present

## 2023-06-05 DIAGNOSIS — F1020 Alcohol dependence, uncomplicated: Secondary | ICD-10-CM | POA: Diagnosis not present

## 2023-06-05 DIAGNOSIS — Z0000 Encounter for general adult medical examination without abnormal findings: Secondary | ICD-10-CM

## 2023-06-05 DIAGNOSIS — N1832 Chronic kidney disease, stage 3b: Secondary | ICD-10-CM

## 2023-06-05 DIAGNOSIS — I10 Essential (primary) hypertension: Secondary | ICD-10-CM | POA: Diagnosis not present

## 2023-06-05 DIAGNOSIS — G6289 Other specified polyneuropathies: Secondary | ICD-10-CM | POA: Diagnosis not present

## 2023-06-05 LAB — LIPID PANEL
Cholesterol: 189 mg/dL (ref 0–200)
HDL: 56.6 mg/dL (ref >39.00)
NonHDL: 132.44
Total CHOL/HDL Ratio: 3
Triglycerides: 213 mg/dL — ABNORMAL HIGH (ref 0.0–149.0)
VLDL: 42.6 mg/dL — ABNORMAL HIGH (ref 0.0–40.0)

## 2023-06-05 LAB — BASIC METABOLIC PANEL
BUN: 13 mg/dL (ref 6–23)
CO2: 28 mEq/L (ref 19–32)
Calcium: 9.3 mg/dL (ref 8.4–10.5)
Chloride: 99 mEq/L (ref 96–112)
Creatinine, Ser: 1.01 mg/dL (ref 0.40–1.20)
GFR: 53.87 mL/min — ABNORMAL LOW (ref >60.00)
Glucose, Bld: 92 mg/dL (ref 70–99)
Potassium: 4 mEq/L (ref 3.5–5.1)
Sodium: 135 mEq/L (ref 135–145)

## 2023-06-05 LAB — MICROALBUMIN / CREATININE URINE RATIO
Creatinine,U: 50.6 mg/dL
Microalb Creat Ratio: 1.4 mg/g (ref 0.0–30.0)
Microalb, Ur: <0.7 mg/dL (ref 0.0–1.9)

## 2023-06-05 LAB — LDL CHOLESTEROL, DIRECT: Direct LDL: 113 mg/dL

## 2023-06-08 NOTE — Assessment & Plan Note (Signed)
We discussed the importance of regular physical activity as tolerated and healthy diet for prevention of chronic illness and/or complications. Fall precautions. Preventive guidelines reviewed. Vaccination up to date. Ca++ and vit D supplementation to continue. Next CPE in a year.

## 2023-06-08 NOTE — Assessment & Plan Note (Addendum)
Cr 1.1-1.4 and e GFR mid 30's to 40's. Continue adequate hydration,low salt diet and avoidance of NSAID's. Continue Losartan 25 mg daily.

## 2023-06-08 NOTE — Assessment & Plan Note (Addendum)
She continues drinking alcohol. We discussed recommendations in regard to alcohol intake, <4 oz.

## 2023-06-08 NOTE — Assessment & Plan Note (Signed)
We discussed D and prognosis. Continue Gabapentin 600 mg bid. Stressed the importance of adequate skin care and fall prevention.

## 2023-06-08 NOTE — Assessment & Plan Note (Signed)
Last LDL 97 in 06/2021. Continue Rosuvastatin 10 mg daily and low fat diet.

## 2023-06-08 NOTE — Assessment & Plan Note (Signed)
BP adequately controlled. Continue Metoprolol succinate 25 mg daily and Losartan 25 mg daily as well as low salt diet.

## 2023-06-16 ENCOUNTER — Other Ambulatory Visit: Payer: Self-pay | Admitting: Family Medicine

## 2023-06-16 DIAGNOSIS — G6289 Other specified polyneuropathies: Secondary | ICD-10-CM

## 2023-07-04 DIAGNOSIS — G8929 Other chronic pain: Secondary | ICD-10-CM | POA: Diagnosis not present

## 2023-07-04 DIAGNOSIS — Z79891 Long term (current) use of opiate analgesic: Secondary | ICD-10-CM | POA: Diagnosis not present

## 2023-07-04 DIAGNOSIS — M5416 Radiculopathy, lumbar region: Secondary | ICD-10-CM | POA: Diagnosis not present

## 2023-07-19 ENCOUNTER — Encounter: Payer: Self-pay | Admitting: Family Medicine

## 2023-07-19 ENCOUNTER — Telehealth: Payer: Self-pay | Admitting: Family Medicine

## 2023-07-19 MED ORDER — ROSUVASTATIN CALCIUM 20 MG PO TABS: 20.0000 mg | ORAL_TABLET | Freq: Every day | ORAL | 3 refills | Status: DC

## 2023-07-19 NOTE — Telephone Encounter (Signed)
Prescription Request  07/19/2023  LOV: 06/05/2023  What is the name of the medication or equipment? rosuvastatin (CRESTOR) Pt said md change her to 20 mg needs new rx Have you contacted your pharmacy to request a refill? No   Which pharmacy would you like this sent to?  [id number] [place], [place] - [address] [place] [address] Phone: [phone] Fax: [phone]    Patient notified that their request is being sent to the clinical staff for review and that they should receive a response within 2 business days.   Please advise at Mobile [phone] (mobile)

## 2023-07-19 NOTE — Telephone Encounter (Signed)
Rx sent 

## 2023-07-28 ENCOUNTER — Other Ambulatory Visit: Payer: Self-pay | Admitting: Family Medicine

## 2023-07-28 DIAGNOSIS — I10 Essential (primary) hypertension: Secondary | ICD-10-CM

## 2023-07-29 ENCOUNTER — Emergency Department (HOSPITAL_COMMUNITY): Payer: Medicare PPO

## 2023-07-29 ENCOUNTER — Other Ambulatory Visit: Payer: Self-pay

## 2023-07-29 ENCOUNTER — Emergency Department (HOSPITAL_COMMUNITY)
Admission: EM | Admit: 2023-07-29 | Discharge: 2023-07-29 | Disposition: A | Discharge status: Home / Self Care | Payer: Medicare PPO | Attending: Emergency Medicine | Admitting: Emergency Medicine

## 2023-07-29 DIAGNOSIS — Z1152 Encounter for screening for COVID-19: Secondary | ICD-10-CM | POA: Insufficient documentation

## 2023-07-29 DIAGNOSIS — Z7982 Long term (current) use of aspirin: Secondary | ICD-10-CM | POA: Insufficient documentation

## 2023-07-29 DIAGNOSIS — R4182 Altered mental status, unspecified: Secondary | ICD-10-CM | POA: Diagnosis not present

## 2023-07-29 DIAGNOSIS — I10 Essential (primary) hypertension: Secondary | ICD-10-CM | POA: Diagnosis not present

## 2023-07-29 DIAGNOSIS — J9811 Atelectasis: Secondary | ICD-10-CM | POA: Diagnosis not present

## 2023-07-29 DIAGNOSIS — Z5329 Procedure and treatment not carried out because of patient's decision for other reasons: Secondary | ICD-10-CM | POA: Diagnosis not present

## 2023-07-29 DIAGNOSIS — E876 Hypokalemia: Secondary | ICD-10-CM | POA: Diagnosis not present

## 2023-07-29 DIAGNOSIS — R531 Weakness: Secondary | ICD-10-CM | POA: Diagnosis not present

## 2023-07-29 LAB — URINALYSIS, W/ REFLEX TO CULTURE (INFECTION SUSPECTED)
Bilirubin Urine: NEGATIVE
Glucose, UA: NEGATIVE mg/dL
Hgb urine dipstick: NEGATIVE
Ketones, ur: NEGATIVE mg/dL
Leukocytes,Ua: NEGATIVE
Nitrite: NEGATIVE
Protein, ur: NEGATIVE mg/dL
Specific Gravity, Urine: 1.004 — ABNORMAL LOW (ref 1.005–1.030)
pH: 6 (ref 5.0–8.0)

## 2023-07-29 LAB — RAPID URINE DRUG SCREEN, HOSP PERFORMED
Amphetamines: NOT DETECTED
Barbiturates: NOT DETECTED
Benzodiazepines: NOT DETECTED
Cocaine: NOT DETECTED
Opiates: NOT DETECTED
Tetrahydrocannabinol: NOT DETECTED

## 2023-07-29 LAB — COMPREHENSIVE METABOLIC PANEL
ALT: 20 U/L (ref 0–44)
AST: 29 U/L (ref 15–41)
Albumin: 3.5 g/dL (ref 3.5–5.0)
Alkaline Phosphatase: 56 U/L (ref 38–126)
Anion gap: 13 (ref 5–15)
BUN: 12 mg/dL (ref 8–23)
CO2: 21 mmol/L — ABNORMAL LOW (ref 22–32)
Calcium: 9.3 mg/dL (ref 8.9–10.3)
Chloride: 102 mmol/L (ref 98–111)
Creatinine, Ser: 1.2 mg/dL — ABNORMAL HIGH (ref 0.44–1.00)
GFR, Estimated: 47 mL/min — ABNORMAL LOW (ref >60)
Glucose, Bld: 83 mg/dL (ref 70–99)
Potassium: 3.1 mmol/L — ABNORMAL LOW (ref 3.5–5.1)
Sodium: 136 mmol/L (ref 135–145)
Total Bilirubin: 0.7 mg/dL (ref 0.3–1.2)
Total Protein: 6.6 g/dL (ref 6.5–8.1)

## 2023-07-29 LAB — CBC
HCT: 37.5 % (ref 36.0–46.0)
Hemoglobin: 12.8 g/dL (ref 12.0–15.0)
MCH: 33.1 pg (ref 26.0–34.0)
MCHC: 34.1 g/dL (ref 30.0–36.0)
MCV: 96.9 fL (ref 80.0–100.0)
Platelets: 214 10*3/uL (ref 150–400)
RBC: 3.87 MIL/uL (ref 3.87–5.11)
RDW: 13 % (ref 11.5–15.5)
WBC: 7.9 10*3/uL (ref 4.0–10.5)
nRBC: 0 % (ref 0.0–0.2)

## 2023-07-29 LAB — RESP PANEL BY RT-PCR (RSV, FLU A&B, COVID)  RVPGX2
Influenza A by PCR: NEGATIVE
Influenza B by PCR: NEGATIVE
Resp Syncytial Virus by PCR: NEGATIVE
SARS Coronavirus 2 by RT PCR: NEGATIVE

## 2023-07-29 LAB — MAGNESIUM: Magnesium: 1.7 mg/dL (ref 1.7–2.4)

## 2023-07-29 LAB — ETHANOL: Alcohol, Ethyl (B): <10 mg/dL (ref <10)

## 2023-07-29 LAB — AMMONIA: Ammonia: 23 umol/L (ref 9–35)

## 2023-07-29 LAB — CBG MONITORING, ED: Glucose-Capillary: 102 mg/dL — ABNORMAL HIGH (ref 70–99)

## 2023-07-29 LAB — TSH: TSH: 1.141 u[IU]/mL (ref 0.350–4.500)

## 2023-07-29 MED ORDER — SODIUM CHLORIDE 0.9 % IV BOLUS
1000.0000 mL | Freq: Once | INTRAVENOUS | Status: AC
  Administered 2023-07-29: 1000 mL via INTRAVENOUS

## 2023-07-29 MED ORDER — POTASSIUM CHLORIDE CRYS ER 20 MEQ PO TBCR
40.0000 meq | EXTENDED_RELEASE_TABLET | Freq: Once | ORAL | Status: AC
  Administered 2023-07-29: 40 meq via ORAL
  Filled 2023-07-29: qty 2

## 2023-07-29 NOTE — Discharge Instructions (Signed)
Return for any problem.  Please follow-up closely with your regular care provider.  Drink alcohol in moderation.

## 2023-07-29 NOTE — ED Triage Notes (Signed)
Pt to ED POV from home. Pt's husband reports pt having some confusion / episodes of AMS x2 days. Pts husband reports episodes of pt "looking around confused" over the last 2 days. Pt states this happened x1 year ago, but cannot remember what she was dx with or what she was admitted for. Pt denies hx of seizures. Pt denies any recent falls. Pt denies any pain. Pt does report poor PO intake over the last few days. Pt denies fevers at home. Pt AAOx3 (disoriented to year) in triage. Pt denies hx of dementia. Pt denies any s/s of urinary issues.

## 2023-07-29 NOTE — ED Provider Notes (Signed)
[place] EMERGENCY DEPARTMENT AT [facility] Provider Note   CSN: [id number] Arrival date & time: 07/29/23  1045     History  Chief Complaint  Patient presents with   Altered Mental Status    Rebecca Baird is a 77 y.o. female.  77 year old female with prior medical history as detailed below presents for evaluation.  Patient with increasing confusion x 2 to 3 days.  Husband reports that the patient has had several days of intermittent confusion.  Patient would reportedly "space out".  Patient intermittently confused as to the time of day, date of the month, where she currently is.  This is not her normal behavior. Symptoms much improved today.  Patient admits to this confusion.  She currently is oriented to person, time, date, location, and situation.  Patient admits that she has not been drinking or eating well for the last several days.  She denies any fever.  She denies any pain.  She denies bowel movement change.  She denies urinary symptoms.  Husband provides additional information - patient drinks ETOH everday - until two days ago. She apparently was consuming up to a liter of liquor daily. It is unclear why she quit drinking   The history is provided by the patient and medical records.       Home Medications Prior to Admission medications   Medication Sig Start Date End Date Taking? Authorizing Provider  alendronate (FOSAMAX) 70 MG tablet TAKE 1 TABLET BY MOUTH EVERY WEEK. TAKE WITH A FULL GLASS OF WATER ON AN EMPTY STOMACH. 09/03/22   [place], Betty G, MD  Ascorbic Acid (VITAMIN C) 1000 MG tablet Take 1,000 mg by mouth daily.    [provider]  aspirin EC 81 MG tablet Take 81 mg by mouth daily. Swallow whole.    [provider]  Cholecalciferol (VITAMIN D3) 125 MCG (5000 UT) CAPS Take 1 capsule by mouth daily.    [provider]  desonide (DESOWEN) 0.05 % cream APPLY TO FACE TOPICALLY DAILY AS NEEDED Patient taking  differently: Apply 1 application  topically daily as needed (rash on face). 10/13/21   [place], Betty G, MD  folic acid (FOLVITE) 800 MCG tablet Take 400 mcg by mouth daily.    [provider]  furosemide (LASIX) 20 MG tablet TAKE 1 TABLET BY MOUTH EVERY DAY 05/10/23   [place], Betty G, MD  gabapentin (NEURONTIN) 300 MG capsule TAKE 1 CAPSULE BY MOUTH TWICE A DAY 06/17/23   [place], Betty G, MD  losartan (COZAAR) 25 MG tablet TAKE 1 TABLET (25 MG TOTAL) BY MOUTH DAILY. 11/16/22   [place], Betty G, MD  metoprolol succinate (TOPROL-XL) 25 MG 24 hr tablet TAKE 1 TABLET (25 MG TOTAL) BY MOUTH DAILY. 07/29/23   [place], Betty G, MD  NUCYNTA ER 200 MG TB12 Take 200 mg by mouth 2 (two) times daily. 10/18/22   [provider]  omeprazole (PRILOSEC) 20 MG capsule TAKE 1 CAPSULE BY MOUTH EVERY DAY 04/22/23   [place], Betty G, MD  pyridOXINE (VITAMIN B-6) 100 MG tablet Take 100 mg by mouth daily.    [provider]  rosuvastatin (CRESTOR) 20 MG tablet Take 1 tablet (20 mg total) by mouth daily. 07/19/23   [place], Betty G, MD  tapentadol (NUCYNTA) 100 MG 12 hr tablet Take 200 mg by mouth every 12 (twelve) hours.    [provider]  thiamine (VITAMIN B-1) 100 MG tablet Take 100 mg by mouth daily.    [provider]  triamcinolone cream (KENALOG) 0.1 % APPLY TO LEGS TOPICALLY TWICE A DAY AS NEEDED Patient taking differently: Apply 1 application  topically 2 (two) times daily as needed (rash on legs). 01/05/22   [place], Betty G, MD      Allergies    Epinephrine    Review of Systems   Review of Systems  All other systems reviewed and are negative.   Physical Exam Updated Vital Signs BP (!) 134/108   Pulse 73   Temp 98 F (36.7 C)   Resp 12   SpO2 97%  Physical Exam Vitals and nursing note reviewed.  Constitutional:      General: She is not in acute distress.    Appearance: Normal appearance. She is well-developed.  HENT:     Head: Normocephalic and atraumatic.  Eyes:      Conjunctiva/sclera: Conjunctivae normal.     Pupils: Pupils are equal, round, and reactive to light.  Cardiovascular:     Rate and Rhythm: Normal rate and regular rhythm.     Heart sounds: Normal heart sounds.  Pulmonary:     Effort: Pulmonary effort is normal. No respiratory distress.     Breath sounds: Normal breath sounds.  Abdominal:     General: There is no distension.     Palpations: Abdomen is soft.     Tenderness: There is no abdominal tenderness.  Musculoskeletal:        General: No deformity. Normal range of motion.     Cervical back: Normal range of motion and neck supple.  Skin:    General: Skin is warm and dry.  Neurological:     General: No focal deficit present.     Mental Status: She is alert and oriented to person, place, and time.     ED Results / Procedures / Treatments   Labs (all labs ordered are listed, but only abnormal results are displayed) Labs Reviewed  COMPREHENSIVE METABOLIC PANEL - Abnormal; Notable for the following components:      Result Value   Potassium 3.1 (*)    CO2 21 (*)    Creatinine, Ser 1.20 (*)    GFR, Estimated 47 (*)    All other components within normal limits  URINALYSIS, W/ REFLEX TO CULTURE (INFECTION SUSPECTED) - Abnormal; Notable for the following components:   Color, Urine STRAW (*)    Specific Gravity, Urine 1.004 (*)    Bacteria, UA RARE (*)    All other components within normal limits  CBG MONITORING, ED - Abnormal; Notable for the following components:   Glucose-Capillary 102 (*)    All other components within normal limits  RESP PANEL BY RT-PCR (RSV, FLU A&B, COVID)  RVPGX2  CBC  MAGNESIUM  TSH  ETHANOL  AMMONIA  RAPID URINE DRUG SCREEN, HOSP PERFORMED    EKG None  Radiology DG Chest Port 1 View  Result Date: 07/29/2023 CLINICAL DATA:  Altered mental status, weakness. EXAM: PORTABLE CHEST 1 VIEW COMPARISON:  January 26, 2022. FINDINGS: The heart size and mediastinal contours are within normal limits.  Hypoinflation of the lungs with minimal bibasilar subsegmental atelectasis. The visualized skeletal structures are unremarkable. IMPRESSION: Hypoinflation of the lungs with minimal bibasilar subsegmental atelectasis. Electronically Signed   By: Lupita Raider M.D.   On: 07/29/2023 13:55    Procedures Procedures    Medications Ordered in ED Medications  sodium chloride 0.9 % bolus 1,000 mL (1,000 mLs Intravenous New Bag/Given 07/29/23 1253)    ED Course/ Medical  Decision Making/ A&P                                 Medical Decision Making Amount and/or Complexity of Data Reviewed Labs: ordered. Radiology: ordered.  Risk Prescription drug management.    Medical Screen Complete  This patient presented to the ED with complaint of transient AMS.  This complaint involves an extensive number of treatment options. The initial differential diagnosis includes, but is not limited to, ETOH withdrawal, CVA, metabolic abnormality, etc  This presentation is: Acute, Chronic, Self-Limited, Previously Undiagnosed, Uncertain Prognosis, Complicated, Systemic Symptoms, and Threat to Life/Bodily Function  Patient with apparent transient intermittent confusion for the last two days. Symptoms seem to have improved significantly today.  Patient without acute findings on neurologic exam.  Initial labs and imaging are without significant abnormality other than mild hypokalemia.  Patient advised that complete work up includes MRI brain. Patient declined to wait for same. She and her husband have capacity to refuse care.  Patient is leaving against medical advice.  I suspect the majority of patient symptoms likely related to abrupt cessation of alcohol intake 3 days ago. Patient without evidence of acute alcohol withdrawal at time of evaluation.  Additional history obtained:  Additional history obtained from [facility] External records from outside sources obtained and reviewed including prior ED  visits and prior Inpatient records.    Lab Tests:  I ordered and personally interpreted labs.  The pertinent results include:  CBC CMP UA Covid Flu ETOH Ammonia   Imaging Studies ordered:  I ordered imaging studies including CT head  I independently visualized and interpreted obtained imaging which showed NAD I agree with the radiologist interpretation.   Cardiac Monitoring:  The patient was maintained on a cardiac monitor.  I personally viewed and interpreted the cardiac monitor which showed an underlying rhythm of: NSR   Test Considered:  MRI brain   Problem List / ED Course:  Transient AMS   Reevaluation:  After the interventions noted above, I reevaluated the patient and found that they have: resolved   Disposition:  After consideration of the diagnostic results and the patient's response to treatment, I feel that the patent would benefit from close outpatient followup - patient left AMA with her husband.   CRITICAL CARE Performed by: Wynetta Fines   Total critical care time: 30 minutes  Critical care time was exclusive of separately billable procedures and treating other patients.  Critical care was necessary to treat or prevent imminent or life-threatening deterioration.  Critical care was time spent personally by me on the following activities: development of treatment plan with patient and/or surrogate as well as nursing, discussions with consultants, evaluation of patient's response to treatment, examination of patient, obtaining history from patient or surrogate, ordering and performing treatments and interventions, ordering and review of laboratory studies, ordering and review of radiographic studies, pulse oximetry and re-evaluation of patient's condition.         Final Clinical Impression(s) / ED Diagnoses Final diagnoses:  Altered mental status, unspecified altered mental status type    Rx / DC Orders ED Discharge Orders     None          Wynetta Fines, MD 07/29/23 [phone]

## 2023-07-30 ENCOUNTER — Telehealth: Payer: Self-pay | Admitting: Family Medicine

## 2023-07-30 DIAGNOSIS — R410 Disorientation, unspecified: Secondary | ICD-10-CM

## 2023-07-30 NOTE — Telephone Encounter (Signed)
It is okay to place referral to neurologist as requested. She has seen Dr. Allena Katz in 06/2022. Thanks, BJ

## 2023-07-30 NOTE — Telephone Encounter (Signed)
Pt recently went to the ED.  Pt was advised to see PCP to discuss Neurology Referral. Pt would like to know if MD could please review ED 'After-summary notes' and provide referral? Please advise.

## 2023-07-30 NOTE — Telephone Encounter (Signed)
Referral placed.

## 2023-08-03 ENCOUNTER — Other Ambulatory Visit: Payer: Self-pay | Admitting: Family Medicine

## 2023-08-03 DIAGNOSIS — I10 Essential (primary) hypertension: Secondary | ICD-10-CM

## 2023-08-03 DIAGNOSIS — N1832 Chronic kidney disease, stage 3b: Secondary | ICD-10-CM

## 2023-08-05 ENCOUNTER — Telehealth: Payer: Self-pay | Admitting: Family Medicine

## 2023-08-05 NOTE — Telephone Encounter (Signed)
Pt call and stated she need dr.Jordan to give her a call back about the MRI also stated they did 20 test on her at the hospital and want to know why do she have to see dr.Patel .

## 2023-08-05 NOTE — Telephone Encounter (Signed)
I called and spoke with patient. Patient is wanting the MRI ordered that the hospital recommended. Advised pt we would need to see her in order to have notes to send for approval with the MRI. Patient in agreement and appointment made for 9/25 at 12:30pm.

## 2023-08-05 NOTE — Telephone Encounter (Signed)
Please set up ED follow up.

## 2023-08-07 ENCOUNTER — Encounter: Payer: Self-pay | Admitting: Family Medicine

## 2023-08-07 ENCOUNTER — Ambulatory Visit: Payer: Medicare PPO | Admitting: Family Medicine

## 2023-08-07 VITALS — BP 122/80 | HR 100 | Resp 16 | Ht 65.0 in | Wt 165.1 lb

## 2023-08-07 DIAGNOSIS — G621 Alcoholic polyneuropathy: Secondary | ICD-10-CM | POA: Diagnosis not present

## 2023-08-07 DIAGNOSIS — I10 Essential (primary) hypertension: Secondary | ICD-10-CM

## 2023-08-07 DIAGNOSIS — R6889 Other general symptoms and signs: Secondary | ICD-10-CM | POA: Diagnosis not present

## 2023-08-07 DIAGNOSIS — I5032 Chronic diastolic (congestive) heart failure: Secondary | ICD-10-CM | POA: Diagnosis not present

## 2023-08-07 DIAGNOSIS — N1832 Chronic kidney disease, stage 3b: Secondary | ICD-10-CM

## 2023-08-07 DIAGNOSIS — E876 Hypokalemia: Secondary | ICD-10-CM

## 2023-08-07 NOTE — Patient Instructions (Addendum)
A few things to remember from today's visit:  Hypokalemia  Stage 3b chronic kidney disease (HCC)  Forgetfulness  No changes today. It is very important to drink enough water and do not forget to take your meds.  If you need refills for medications you take chronically, please call your pharmacy. Do not use My Chart to request refills or for acute issues that need immediate attention. If you send a my chart message, it may take a few days to be addressed, specially if I am not in the office.  Please be sure medication list is accurate. If a new problem present, please set up appointment sooner than planned today.

## 2023-08-07 NOTE — Progress Notes (Signed)
Chief Complaint  Patient presents with   Follow-up   HPI: [name] is a 77 y.o. female with a PMHx significant for chronic pain, peripheral neuropathy, PAD, diastolic dysfunction, alcohol dependency, anxiety, CKD III, and HTN, who is here today for ED follow up for altered mental status.   She was evaluated in the ED on 9/16 for confusion.  She says she could not remember dates or times, and couldn't walk to the bathroom without assistance, balance felt worse.  She adds that she had forgotten to take several medications 2-3 days before ED evaluation, including her pain medication (Nucynta).  She states this only happened once, and she is now back at baseline, but mentions that she does have less severe confusion episodes when she gets upset. In the past her husband has reported episodes of forgetfulness over the past few years.  She notes that she continues to have decreased appetite.   She denies fever, headache, chest pain, SOB, abdominal pain, vomiting, or changes to her bowel or urinary habits.  She mentions that she hasn't driven in years. She uses a cane for assistance.  Although she had stopped drinking alcohol for a period prior to her ER visit,2-3 days,  she has resumed alcohol intake and is currently drinking 1-1.5 glasses of wine with dinner.  She had an appointment with neurology but cancelled because she didn't like her past experience with the neurologist and mentions that she does not want a brain MRI, which according to pt, was mentioned during ED visit. She recalls having a brain MRI years ago, "mini strokes" were reported.  Lab Results  Component Value Date   WBC 7.9 07/29/2023   HGB 12.8 07/29/2023   HCT 37.5 07/29/2023   MCV 96.9 07/29/2023   PLT 214 07/29/2023   Head CT done on 07/29/23 no acute intracranial abnormality.  Peripheral neuropathy on Gabapentin 300 mg bid.. LE numbness stable.  HTN: She is on Losartan 25 mg daily and  Metoprolol succinate 25 mg daily. Not checking BP at home. Denies visual changes, chest pain, dyspnea, palpitation, or  worsening edema. Echo 07/2021 LVEF 60-65% and grade I diastolic dysfunction. Acute exacerbation in 01/2022.  Negative for orthopnea or PND. On Furosemide 20 mg daily.  Lab Results  Component Value Date   NA 136 07/29/2023   CL 102 07/29/2023   K 3.1 (L) 07/29/2023   CO2 21 (L) 07/29/2023   BUN 12 07/29/2023   CREATININE 1.20 (H) 07/29/2023   GFRNONAA 47 (L) 07/29/2023   CALCIUM 9.3 07/29/2023   ALBUMIN 3.5 07/29/2023   GLUCOSE 83 07/29/2023   Review of Systems  Constitutional:  Positive for fatigue. Negative for activity change, appetite change and fever.  HENT:  Negative for mouth sores and nosebleeds.   Eyes:  Negative for redness and visual disturbance.  Respiratory:  Negative for cough and wheezing.   Gastrointestinal:  Negative for abdominal pain, nausea and vomiting.       Negative for changes in bowel habits.  Genitourinary:  Negative for decreased urine volume, dysuria and hematuria.  Musculoskeletal:  Positive for arthralgias and gait problem.  Neurological:  Negative for syncope and facial asymmetry.  Psychiatric/Behavioral:  Negative for hallucinations.   See other pertinent positives and negatives in HPI.  Current Outpatient Medications on File Prior to Visit  Medication Sig Dispense Refill   alendronate (FOSAMAX) 70 MG tablet TAKE 1 TABLET BY MOUTH EVERY WEEK. TAKE WITH A FULL GLASS OF WATER ON AN EMPTY STOMACH.  12 tablet 2   Ascorbic Acid (VITAMIN C) 1000 MG tablet Take 1,000 mg by mouth daily.     aspirin EC 81 MG tablet Take 81 mg by mouth daily. Swallow whole.     Cholecalciferol (VITAMIN D3) 125 MCG (5000 UT) CAPS Take 1 capsule by mouth daily.     desonide (DESOWEN) 0.05 % cream APPLY TO FACE TOPICALLY DAILY AS NEEDED (Patient taking differently: Apply 1 application  topically daily as needed (rash on face).) 30 g 1   folic acid (FOLVITE)  800 MCG tablet Take 400 mcg by mouth daily.     furosemide (LASIX) 20 MG tablet TAKE 1 TABLET BY MOUTH EVERY DAY 90 tablet 1   gabapentin (NEURONTIN) 300 MG capsule TAKE 1 CAPSULE BY MOUTH TWICE A DAY 60 capsule 5   losartan (COZAAR) 25 MG tablet TAKE 1 TABLET (25 MG TOTAL) BY MOUTH DAILY. 90 tablet 2   metoprolol succinate (TOPROL-XL) 25 MG 24 hr tablet TAKE 1 TABLET (25 MG TOTAL) BY MOUTH DAILY. 90 tablet 2   NUCYNTA ER 200 MG TB12 Take 200 mg by mouth 2 (two) times daily.     omeprazole (PRILOSEC) 20 MG capsule TAKE 1 CAPSULE BY MOUTH EVERY DAY 90 capsule 1   pyridOXINE (VITAMIN B-6) 100 MG tablet Take 100 mg by mouth daily.     rosuvastatin (CRESTOR) 20 MG tablet Take 1 tablet (20 mg total) by mouth daily. 90 tablet 3   tapentadol (NUCYNTA) 100 MG 12 hr tablet Take 200 mg by mouth every 12 (twelve) hours.     thiamine (VITAMIN B-1) 100 MG tablet Take 100 mg by mouth daily.     triamcinolone cream (KENALOG) 0.1 % APPLY TO LEGS TOPICALLY TWICE A DAY AS NEEDED (Patient taking differently: Apply 1 application  topically 2 (two) times daily as needed (rash on legs).) 30 g 2   No current facility-administered medications on file prior to visit.    Past Medical History:  Diagnosis Date   Anxiety    Depression    GERD (gastroesophageal reflux disease)    Hypertension    Peripheral neuropathy    Allergies  Allergen Reactions   Epinephrine Other (See Comments) and Palpitations    "Heart Races Uncomfortably"     Social History   Socioeconomic History   Marital status: Married    Spouse name: Not on file   Number of children: 1   Years of education: Not on file   Highest education level: Associate degree: academic program  Occupational History   Occupation: retired  Tobacco Use   Smoking status: Former    Current packs/day: 0.00    Types: Cigarettes    Quit date: 1984    Years since quitting: 40.7   Smokeless tobacco: Never  Vaping Use   Vaping status: Never Used  Substance  and Sexual Activity   Alcohol use: Yes    Alcohol/week: 3.0 standard drinks of alcohol    Types: 3 Glasses of wine per week    Comment: Drinks Wine   Drug use: Yes    Types: Other-see comments    Comment: nucytha   Sexual activity: Not on file  Other Topics Concern   Not on file  Social History Narrative   Right handed   One story home   Drinks occasional caffeine   Social Determinants of Health   Financial Resource Strain: Low Risk  (12/28/2022)   Overall Financial Resource Strain (CARDIA)    Difficulty of Paying Living Expenses:  Not hard at all  Food Insecurity: No Food Insecurity (12/28/2022)   Hunger Vital Sign    Worried About Running Out of Food in the Last Year: Never true    Ran Out of Food in the Last Year: Never true  Transportation Needs: No Transportation Needs (12/28/2022)   PRAPARE - Administrator, Civil Service (Medical): No    Lack of Transportation (Non-Medical): No  Physical Activity: Sufficiently Active (12/28/2022)   Exercise Vital Sign    Days of Exercise per Week: 5 days    Minutes of Exercise per Session: 30 min  Stress: No Stress Concern Present (12/28/2022)   [employer] of Occupational Health - Occupational Stress Questionnaire    Feeling of Stress : Not at all  Social Connections: Socially Integrated (12/28/2022)   Social Connection and Isolation Panel [NHANES]    Frequency of Communication with Friends and Family: More than three times a week    Frequency of Social Gatherings with Friends and Family: More than three times a week    Attends Religious Services: More than 4 times per year    Active Member of [employer] or Organizations: Yes    Attends Banker Meetings: More than 4 times per year    Marital Status: Married   Vitals:   08/07/23 1221  BP: 122/80  Pulse: 100  Resp: 16  SpO2: 95%   Body mass index is 27.48 kg/m.  Physical Exam Vitals and nursing note reviewed.  Constitutional:      General: She is not  in acute distress.    Appearance: She is well-developed.  HENT:     Head: Normocephalic and atraumatic.     Mouth/Throat:     Mouth: Mucous membranes are moist.     Pharynx: Uvula midline.  Eyes:     Conjunctiva/sclera: Conjunctivae normal.  Cardiovascular:     Rate and Rhythm: Normal rate and regular rhythm.     Heart sounds: Murmur (SEM II/VI RUSB and LUSB) heard.     Comments: DP pulses palpable. Pulmonary:     Effort: Pulmonary effort is normal. No respiratory distress.     Breath sounds: Normal breath sounds.  Abdominal:     Palpations: Abdomen is soft. There is no mass.     Tenderness: There is no abdominal tenderness.  Musculoskeletal:     Right lower leg: No edema.     Left lower leg: No edema.  Skin:    General: Skin is warm.     Findings: No erythema or rash.  Neurological:     Mental Status: She is alert. Mental status is at baseline.     Cranial Nerves: No cranial nerve deficit.     Comments: Unstable gait, assisted with a cane.  Psychiatric:        Mood and Affect: Mood and affect normal.   ASSESSMENT AND PLAN:  Ms. Flammang was seen today for ED follow up for altered mental status.   Forgetfulness Problem has been going on for some time with episodes of exacerbation. Last episode of confusion could have been caused by withdrawal from opioids and alcohol. She is not interested in consultation with neurologist nor neuropsychiatric evaluation. Monitor for new symptoms.  Hypokalemia Assessment & Plan: On 07/29/23 K+ 3.1. We do not have lab service today, she does not want to come back another day or go to another facility, so will re-check next visit. K+ rich diet recommended, information on AVS.   Stage 3b chronic kidney  disease [facility]) Assessment & Plan: Cr 1.1-1.4 and e GFR mid 30's to 40's, it was 47 on 07/29/23. Stressed the importance adequate hydration,low salt diet and avoidance of NSAID's. Continue Losartan 25 mg daily.  Benign essential  hypertension Assessment & Plan: BP adequately controlled. Continue Metoprolol succinate 25 mg daily and Losartan 25 mg daily as well as low salt diet. Monitor BP at home.  Alcoholic peripheral neuropathy [facility]) Assessment & Plan: She has seen neurologist in the past. Evaluated by neurologist, last seen 12/14/19. She underwent NCS/EMG of LE's at  [facility] because of numbness, tingling, and burning sensation on lower extremities and hands.  She is not interested in seeing neurologist at this time. Fall precautions discussed. Continue Gabapentin 300 mg bid. Encouraged decreasing alcohol intake.  Chronic heart failure with preserved ejection fraction (HCC) Euvolemic today. Continue Furosemide, Losartan,and Metoprolol succinate same dose.  I spent a total of 41 minutes in both face to face and non face to face activities for this visit on the date of this encounter. During this time history was obtained and documented, examination was performed, prior labs/imaging reviewed, and assessment/plan discussed.  Return in about 3 months (around 11/06/2023).  I, Rolla Etienne Wierda, acting as a scribe for Meyli Boice [place], MD., have documented all relevant documentation on the behalf of Ninah Moccio [place], MD, as directed by  Karelly Dewalt [place], MD while in the presence of Asir Bingley [place], MD.   I, Tishanna Dunford [place], MD, have reviewed all documentation for this visit. The documentation on 08/08/23 for the exam, diagnosis, procedures, and orders are all accurate and complete.  Tarita Deshmukh G. [place], MD  [facility]. [place] office.

## 2023-08-08 NOTE — Assessment & Plan Note (Addendum)
She has seen neurologist in the past. Evaluated by neurologist, last seen 12/14/19. She underwent NCS/EMG of LE's at [facility] because of numbness, tingling, and burning sensation on lower extremities and hands.  She is not interested in seeing neurologist at this time. Fall precautions discussed. Continue Gabapentin 300 mg bid. Encouraged decreasing alcohol intake.

## 2023-08-08 NOTE — Assessment & Plan Note (Signed)
BP adequately controlled. Continue Metoprolol succinate 25 mg daily and Losartan 25 mg daily as well as low salt diet. Monitor BP at home.

## 2023-08-08 NOTE — Assessment & Plan Note (Signed)
Cr 1.1-1.4 and e GFR mid 30's to 40's, it was 47 on 07/29/23. Stressed the importance adequate hydration,low salt diet and avoidance of NSAID's. Continue Losartan 25 mg daily.

## 2023-08-08 NOTE — Assessment & Plan Note (Signed)
On 07/29/23 K+ 3.1. We do not have lab service today, she does not want to come back another day or go to another facility, so will re-check next visit. K+ rich diet recommended, information on AVS.

## 2023-08-13 ENCOUNTER — Ambulatory Visit: Payer: Medicare PPO | Admitting: Neurology

## 2023-08-24 ENCOUNTER — Other Ambulatory Visit: Payer: Self-pay | Admitting: Family Medicine

## 2023-08-24 DIAGNOSIS — M816 Localized osteoporosis [Lequesne]: Secondary | ICD-10-CM

## 2023-08-29 DIAGNOSIS — G8929 Other chronic pain: Secondary | ICD-10-CM | POA: Diagnosis not present

## 2023-08-29 DIAGNOSIS — M5416 Radiculopathy, lumbar region: Secondary | ICD-10-CM | POA: Diagnosis not present

## 2023-09-02 ENCOUNTER — Telehealth: Payer: Self-pay | Admitting: *Deleted

## 2023-09-02 NOTE — Telephone Encounter (Signed)
Transition Care Management Unsuccessful Follow-up Telephone Call  Date of discharge and from where:  [facility]. [facility]  07/29/2023  Attempts:  2nd Attempt  Reason for unsuccessful TCM follow-up call:  Left voice message

## 2023-09-02 NOTE — Telephone Encounter (Signed)
Transition Care Management Unsuccessful Follow-up Telephone Call  Date of discharge and from where:  [facility]. [facility]  07/29/2023  Attempts:  1st Attempt  Reason for unsuccessful TCM follow-up call:  Left voice message

## 2023-10-17 DIAGNOSIS — K219 Gastro-esophageal reflux disease without esophagitis: Secondary | ICD-10-CM | POA: Diagnosis not present

## 2023-10-17 DIAGNOSIS — I129 Hypertensive chronic kidney disease with stage 1 through stage 4 chronic kidney disease, or unspecified chronic kidney disease: Secondary | ICD-10-CM | POA: Diagnosis not present

## 2023-10-17 DIAGNOSIS — G629 Polyneuropathy, unspecified: Secondary | ICD-10-CM | POA: Diagnosis not present

## 2023-10-17 DIAGNOSIS — N1831 Chronic kidney disease, stage 3a: Secondary | ICD-10-CM | POA: Diagnosis not present

## 2023-10-17 DIAGNOSIS — M1990 Unspecified osteoarthritis, unspecified site: Secondary | ICD-10-CM | POA: Diagnosis not present

## 2023-10-17 DIAGNOSIS — M810 Age-related osteoporosis without current pathological fracture: Secondary | ICD-10-CM | POA: Diagnosis not present

## 2023-10-17 DIAGNOSIS — K5900 Constipation, unspecified: Secondary | ICD-10-CM | POA: Diagnosis not present

## 2023-10-17 DIAGNOSIS — E785 Hyperlipidemia, unspecified: Secondary | ICD-10-CM | POA: Diagnosis not present

## 2023-10-17 DIAGNOSIS — R2681 Unsteadiness on feet: Secondary | ICD-10-CM | POA: Diagnosis not present

## 2023-10-24 DIAGNOSIS — M5416 Radiculopathy, lumbar region: Secondary | ICD-10-CM | POA: Diagnosis not present

## 2023-10-24 DIAGNOSIS — Z79891 Long term (current) use of opiate analgesic: Secondary | ICD-10-CM | POA: Diagnosis not present

## 2023-10-24 DIAGNOSIS — G8929 Other chronic pain: Secondary | ICD-10-CM | POA: Diagnosis not present

## 2023-10-26 ENCOUNTER — Other Ambulatory Visit: Payer: Self-pay | Admitting: Family Medicine

## 2023-11-04 ENCOUNTER — Ambulatory Visit: Payer: Medicare PPO | Admitting: Family Medicine

## 2023-11-08 ENCOUNTER — Other Ambulatory Visit: Payer: Self-pay | Admitting: Family Medicine

## 2023-12-06 ENCOUNTER — Ambulatory Visit: Payer: Medicare PPO | Admitting: Family Medicine

## 2023-12-06 ENCOUNTER — Telehealth: Payer: Self-pay

## 2023-12-06 ENCOUNTER — Encounter: Payer: Self-pay | Admitting: Family Medicine

## 2023-12-06 VITALS — BP 124/80 | HR 100 | Resp 16 | Ht 65.0 in | Wt 174.0 lb

## 2023-12-06 DIAGNOSIS — I10 Essential (primary) hypertension: Secondary | ICD-10-CM

## 2023-12-06 DIAGNOSIS — F1020 Alcohol dependence, uncomplicated: Secondary | ICD-10-CM | POA: Diagnosis not present

## 2023-12-06 DIAGNOSIS — R7303 Prediabetes: Secondary | ICD-10-CM | POA: Insufficient documentation

## 2023-12-06 DIAGNOSIS — N1832 Chronic kidney disease, stage 3b: Secondary | ICD-10-CM

## 2023-12-06 DIAGNOSIS — E876 Hypokalemia: Secondary | ICD-10-CM

## 2023-12-06 DIAGNOSIS — G6289 Other specified polyneuropathies: Secondary | ICD-10-CM

## 2023-12-06 DIAGNOSIS — M816 Localized osteoporosis [Lequesne]: Secondary | ICD-10-CM

## 2023-12-06 LAB — BASIC METABOLIC PANEL
BUN: 15 mg/dL (ref 6–23)
CO2: 28 meq/L (ref 19–32)
Calcium: 10 mg/dL (ref 8.4–10.5)
Chloride: 101 meq/L (ref 96–112)
Creatinine, Ser: 0.98 mg/dL (ref 0.40–1.20)
GFR: 55.66 mL/min — ABNORMAL LOW (ref >60.00)
Glucose, Bld: 104 mg/dL — ABNORMAL HIGH (ref 70–99)
Potassium: 3.8 meq/L (ref 3.5–5.1)
Sodium: 139 meq/L (ref 135–145)

## 2023-12-06 LAB — MICROALBUMIN / CREATININE URINE RATIO
Creatinine,U: 63.2 mg/dL
Microalb Creat Ratio: 1.1 mg/g (ref 0.0–30.0)
Microalb, Ur: <0.7 mg/dL (ref 0.0–1.9)

## 2023-12-06 LAB — HEMOGLOBIN A1C: Hgb A1c MFr Bld: 5.9 % (ref 4.6–6.5)

## 2023-12-06 LAB — VITAMIN D 25 HYDROXY (VIT D DEFICIENCY, FRACTURES): VITD: >120 ng/mL

## 2023-12-06 NOTE — Assessment & Plan Note (Signed)
DEXA 04/2021. Continue adequate calcium and vitamin D supplementation. Regular physical activity as tolerated,she has limitations due to chronic pain. Fall precautions. Continue Fosamax 70 mg weekly, she has been on this medication for 3 years. DEXA will be arranged.

## 2023-12-06 NOTE — Assessment & Plan Note (Addendum)
K+ 3.1 in 07/2023. Continue potassium rich diet intake. She takes Furosemide for LE edema. Further recommendation will be given according to lab results.

## 2023-12-06 NOTE — Assessment & Plan Note (Signed)
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 She would like to see neuro, a different provider. Continue Gabapentin 300 mg bid and appropriate skin care. Fall precautions to continue. On Nucynta, prescribed at pain management.

## 2023-12-06 NOTE — Patient Instructions (Addendum)
A few things to remember from today's visit:  Other polyneuropathy - Plan: Ambulatory referral to Neurology  Benign essential hypertension  Stage 3b chronic kidney disease (HCC) - Plan: Basic metabolic panel, Microalbumin / creatinine urine ratio, VITAMIN D 25 Hydroxy (Vit-D Deficiency, Fractures)  Uncomplicated alcohol dependence (HCC), Chronic  Localized osteoporosis without current pathological fracture - Plan: DG BONE DENSITY (DXA)  Prediabetes - Plan: Hemoglobin A1c  No changes today. If you need refills for medications you take chronically, please call your pharmacy. Do not use My Chart to request refills or for acute issues that need immediate attention. If you send a my chart message, it may take a few days to be addressed, specially if I am not in the office.  Please be sure medication list is accurate. If a new problem present, please set up appointment sooner than planned today.

## 2023-12-06 NOTE — Assessment & Plan Note (Signed)
In general BP adequately controlled. Recommend monitoring BP at home.  Continue Metoprolol succinate 25 mg daily and Losartan 25 mg daily. Low salt diet to continue. F/U in 6 months.

## 2023-12-06 NOTE — Telephone Encounter (Signed)
CRITICAL VALUE STICKER  CRITICAL VALUE:  Vitamin D is greater than 120.  RECEIVER (on-site recipient of call):  DATE & TIME NOTIFIED:   MESSENGER (representative from lab): Hope from [facility]  MD NOTIFIED: Dr. [place]  TIME OF NOTIFICATION:  RESPONSE:

## 2023-12-06 NOTE — Assessment & Plan Note (Signed)
Hemoglobin A1c was 5.8 in 01/2022. Encouraged consistency with a healthy lifestyle for diabetes prevention. Further recommendation will be given according to hemoglobin A1c result.

## 2023-12-06 NOTE — Progress Notes (Unsigned)
HPI: Rebecca Baird is a 78 y.o. female with a PMHx significant for chronic pain, peripheral neuropathy, PAD, diastolic dysfunction, alcohol dependency, anxiety, CKD III, HLD, and HTN, who is here today for chronic disease management.  Last seen on 08/07/2023  Chronic pain:  Currently on Nucynta 200 mg daily prescribed by pain management. She is interested in potentially changing medications because of cost.  She follows with her chronic pain doctor every 2 months.   Peripheral neuropathy/foot callus:  Currently on gabapentin 300 mg twice daily.  She still walks with a cane when going out, but doesn't use it in the house.  She has a large callus on her right foot.  No longer following with podiatry because of cost.  She would also like a referral to neurology.   Memory:  In general, she says she is doing well and not having more episodes.  She says she feels a little tired this morning, but attributes that to not getting enough sleep last night.   Hypertension:  Medications: Currently on losartan 25 mg daily and metoprolol succinate 25 mg daily. She also takes furosemide 20 mg daily for LE edema.  BP readings at home: She has not been checking her BP at home.  Side effects: none Vision: Her last eye exam was within the last 6 months. She is not very satisfied with her glasses right now.  Negative for unusual or severe headache, visual changes, exertional chest pain, dyspnea, palpitations, wheezing, focal weakness, or worsening edema. CKD III: Negative for gross hematuria, foamy urine, or decreased urine output.  Lab Results  Component Value Date   NA 136 07/29/2023   CL 102 07/29/2023   K 3.1 (L) 07/29/2023   CO2 21 (L) 07/29/2023   BUN 12 07/29/2023   CREATININE 1.20 (H) 07/29/2023   GFRNONAA 47 (L) 07/29/2023   CALCIUM 9.3 07/29/2023   ALBUMIN 3.5 07/29/2023   GLUCOSE 83 07/29/2023   Hyperlipidemia: Currently on rosuvastatin 20 mg daily.  Lab Results   Component Value Date   CHOL 189 06/05/2023   HDL 56.60 06/05/2023   LDLCALC 97 06/27/2021   LDLDIRECT 113.0 06/05/2023   TRIG 213.0 (H) 06/05/2023   CHOLHDL 3 06/05/2023   Prediabetes: Negative for polyphagia, polydipsia, polyuria. Lab Results  Component Value Date   HGBA1C 5.8 01/23/2022    Osteoporosis:  She has been taking Fosamax 70 mg weekly since 2022.  Also taking vitamin D with calcium.  ***  Alcohol: She drinks 2-3 glasses of wine per day.   Review of Systems  Constitutional:  Negative for activity change, appetite change, chills and fever.  HENT:  Negative for sore throat and trouble swallowing.   Respiratory:  Negative for shortness of breath and wheezing.   Gastrointestinal:  Negative for abdominal pain, nausea and vomiting.  Genitourinary:  Negative for dysuria.  Musculoskeletal:  Positive for arthralgias and gait problem.   See other pertinent positives and negatives in HPI.  Current Outpatient Medications on File Prior to Visit  Medication Sig Dispense Refill   alendronate (FOSAMAX) 70 MG tablet TAKE 1 TABLET BY MOUTH EVERY WEEK. TAKE WITH A FULL GLASS OF WATER ON AN EMPTY STOMACH. 12 tablet 2   Ascorbic Acid (VITAMIN C) 1000 MG tablet Take 1,000 mg by mouth daily.     aspirin EC 81 MG tablet Take 81 mg by mouth daily. Swallow whole.     Cholecalciferol (VITAMIN D3) 125 MCG (5000 UT) CAPS Take 1 capsule by mouth daily.  desonide (DESOWEN) 0.05 % cream APPLY TO FACE TOPICALLY DAILY AS NEEDED (Patient taking differently: Apply 1 application  topically daily as needed (rash on face).) 30 g 1   folic acid (FOLVITE) 800 MCG tablet Take 400 mcg by mouth daily.     furosemide (LASIX) 20 MG tablet TAKE 1 TABLET BY MOUTH EVERY DAY 90 tablet 0   gabapentin (NEURONTIN) 300 MG capsule TAKE 1 CAPSULE BY MOUTH TWICE A DAY 60 capsule 5   losartan (COZAAR) 25 MG tablet TAKE 1 TABLET (25 MG TOTAL) BY MOUTH DAILY. 90 tablet 2   metoprolol succinate (TOPROL-XL) 25 MG 24 hr  tablet TAKE 1 TABLET (25 MG TOTAL) BY MOUTH DAILY. 90 tablet 2   NUCYNTA ER 200 MG TB12 Take 200 mg by mouth 2 (two) times daily.     omeprazole (PRILOSEC) 20 MG capsule TAKE 1 CAPSULE BY MOUTH EVERY DAY 90 capsule 1   pyridOXINE (VITAMIN B-6) 100 MG tablet Take 100 mg by mouth daily.     rosuvastatin (CRESTOR) 20 MG tablet Take 1 tablet (20 mg total) by mouth daily. 90 tablet 3   thiamine (VITAMIN B-1) 100 MG tablet Take 100 mg by mouth daily.     triamcinolone cream (KENALOG) 0.1 % APPLY TO LEGS TOPICALLY TWICE A DAY AS NEEDED (Patient taking differently: Apply 1 application  topically 2 (two) times daily as needed (rash on legs).) 30 g 2   No current facility-administered medications on file prior to visit.    Past Medical History:  Diagnosis Date   Anxiety    Depression    GERD (gastroesophageal reflux disease)    Hypertension    Peripheral neuropathy    Allergies  Allergen Reactions   Epinephrine Other (See Comments) and Palpitations    "Heart Races Uncomfortably"     Social History   Socioeconomic History   Marital status: Married    Spouse name: Not on file   Number of children: 1   Years of education: Not on file   Highest education level: Associate degree: academic program  Occupational History   Occupation: retired  Tobacco Use   Smoking status: Former    Current packs/day: 0.00    Types: Cigarettes    Quit date: 1984    Years since quitting: 41.0   Smokeless tobacco: Never  Vaping Use   Vaping status: Never Used  Substance and Sexual Activity   Alcohol use: Yes    Alcohol/week: 3.0 standard drinks of alcohol    Types: 3 Glasses of wine per week    Comment: Drinks Wine   Drug use: Yes    Types: Other-see comments    Comment: nucytha   Sexual activity: Not on file  Other Topics Concern   Not on file  Social History Narrative   Right handed   One story home   Drinks occasional caffeine   Social Drivers of Health   Financial Resource Strain: Low  Risk  (12/28/2022)   Overall Financial Resource Strain (CARDIA)    Difficulty of Paying Living Expenses: Not hard at all  Food Insecurity: No Food Insecurity (12/28/2022)   Hunger Vital Sign    Worried About Running Out of Food in the Last Year: Never true    Ran Out of Food in the Last Year: Never true  Transportation Needs: No Transportation Needs (12/28/2022)   PRAPARE - Administrator, Civil Service (Medical): No    Lack of Transportation (Non-Medical): No  Physical Activity: Sufficiently Active (  12/28/2022)   Exercise Vital Sign    Days of Exercise per Week: 5 days    Minutes of Exercise per Session: 30 min  Stress: No Stress Concern Present (12/28/2022)   [employer] of Occupational Health - Occupational Stress Questionnaire    Feeling of Stress : Not at all  Social Connections: Socially Integrated (12/28/2022)   Social Connection and Isolation Panel [NHANES]    Frequency of Communication with Friends and Family: More than three times a week    Frequency of Social Gatherings with Friends and Family: More than three times a week    Attends Religious Services: More than 4 times per year    Active Member of [employer] or Organizations: Yes    Attends Banker Meetings: More than 4 times per year    Marital Status: Married    Vitals:   12/06/23 0953  BP: 124/80  Pulse: 100  Resp: 16  SpO2: 92%   Body mass index is 28.96 kg/m.  Physical Exam Vitals and nursing note reviewed.  Constitutional:      General: She is not in acute distress.    Appearance: She is well-developed.  HENT:     Head: Normocephalic and atraumatic.     Mouth/Throat:     Mouth: Mucous membranes are moist.     Pharynx: Uvula midline.  Eyes:     Conjunctiva/sclera: Conjunctivae normal.  Cardiovascular:     Rate and Rhythm: Normal rate and regular rhythm.     Pulses:          Dorsalis pedis pulses are 2+ on the right side and 2+ on the left side.       Posterior tibial pulses  are 2+ on the right side and 2+ on the left side.     Heart sounds: No murmur (SEM II/VI RUSB and LUSB) heard.    Comments: Trace pitting edema Pulmonary:     Effort: Pulmonary effort is normal. No respiratory distress.     Breath sounds: Normal breath sounds.  Abdominal:     Palpations: Abdomen is soft. There is no mass.     Tenderness: There is no abdominal tenderness.  Musculoskeletal:     Right lower leg: Pitting Edema present.     Left lower leg: Pitting Edema present.  Feet:     Right foot:     Skin integrity: Callus present.  Skin:    General: Skin is warm.     Findings: No erythema or rash.     Comments: Thick callus right plantar under first MTP joint.  Neurological:     Mental Status: She is alert and oriented to person, place, and time.     Comments: Mildly unstable gait, not assisted today.  Psychiatric:        Mood and Affect: Affect normal. Mood is anxious.    ASSESSMENT AND PLAN:  Ms. Quiggle was seen today for chronic disease management.  Orders Placed This Encounter  Procedures   DG BONE DENSITY (DXA)   Basic metabolic panel   Microalbumin / creatinine urine ratio   Hemoglobin A1c   VITAMIN D 25 Hydroxy (Vit-D Deficiency, Fractures)   Ambulatory referral to Neurology   *** Other polyneuropathy Assessment & Plan: We reviewed possible causes, thought to be alcoholic neuropathy. She would like to see neuro, a different provider. Continue Gabapentin 300 mg bid and appropriate skin care. Fall precautions to continue. On Nucynta, prescribed at pain management.  Orders: -     Ambulatory  referral to Neurology; Future  Benign essential hypertension Assessment & Plan: In general BP adequately controlled. Recommend monitoring BP at home.  Continue Metoprolol succinate 25 mg daily and Losartan 25 mg daily. Low salt diet to continue. F/U in 6 months.   Stage 3b chronic kidney disease (HCC) Assessment & Plan: Cr 1.1-1.4 and e GFR mid 30's to 40's, it  was 47 on 07/29/23. Discussed the importance adequate hydration. Continue low salt diet and avoidance of NSAID's. She is on Losartan 25 mg daily.  Orders: -     Basic metabolic panel; Future -     Microalbumin / creatinine urine ratio; Future -     VITAMIN D 25 Hydroxy (Vit-D Deficiency, Fractures); Future  Uncomplicated alcohol dependence (HCC) Assessment & Plan: She continues to drink 2-3 glasses of wine. We discussed adverse effects of alcohol intake and new CDC warnings in this regard. Recommend trying to decrease wine intake to 1 glass daily and eventually stop it.   Localized osteoporosis without current pathological fracture Assessment & Plan: DEXA 04/2021. Continue adequate calcium and vitamin D supplementation. Regular physical activity as tolerated,she has limitations due to chronic pain. Fall precautions. Continue Fosamax 70 mg weekly, she has been on this medication for 3 years. DEXA will be arranged.  Orders: -     DG Bone Density; Future  Prediabetes Assessment & Plan: Hemoglobin A1c was 5.8 in 01/2022. Encouraged consistency with a healthy lifestyle for diabetes prevention. Further recommendation will be given according to hemoglobin A1c result.  Orders: -     Hemoglobin A1c; Future  Hypokalemia Assessment & Plan: K+ 3.1 in 07/2023. Continue potassium rich diet intake. She takes Furosemide for LE edema. Further recommendation will be given according to lab results.   -She is not fasting today, so we will check lipid panel next visit.  Return in about 6 months (around 06/04/2024) for chronic problems.  I, Rolla Etienne Wierda, acting as a scribe for Chimaobi Casebolt [place], MD., have documented all relevant documentation on the behalf of Ronold Hardgrove [place], MD, as directed by  Milton Streicher [place], MD while in the presence of Lexxus Underhill [place], MD.   I, Everhett Bozard [place], MD, have reviewed all documentation for this visit. The documentation on 12/06/23 for the exam, diagnosis, procedures, and  orders are all accurate and complete.  Ladislav Caselli G. [place], MD  [facility] [place] office.

## 2023-12-06 NOTE — Assessment & Plan Note (Signed)
She continues to drink 2-3 glasses of wine. We discussed adverse effects of alcohol intake and new CDC warnings in this regard. Recommend trying to decrease wine intake to 1 glass daily and eventually stop it.

## 2023-12-06 NOTE — Assessment & Plan Note (Signed)
Cr 1.1-1.4 and e GFR mid 30's to 40's, it was 47 on 07/29/23. Discussed the importance adequate hydration. Continue low salt diet and avoidance of NSAID's. She is on Losartan 25 mg daily.

## 2023-12-06 NOTE — Telephone Encounter (Signed)
Per PCP - pt needs to stop any vitamin D. I called and spoke with patient. She is aware to stop her vitamin D supplements.

## 2023-12-07 ENCOUNTER — Encounter: Payer: Self-pay | Admitting: Family Medicine

## 2023-12-09 ENCOUNTER — Other Ambulatory Visit: Payer: Self-pay | Admitting: Family Medicine

## 2023-12-09 DIAGNOSIS — Z13820 Encounter for screening for osteoporosis: Secondary | ICD-10-CM

## 2023-12-13 ENCOUNTER — Other Ambulatory Visit: Payer: Self-pay | Admitting: Family Medicine

## 2023-12-13 DIAGNOSIS — Z1231 Encounter for screening mammogram for malignant neoplasm of breast: Secondary | ICD-10-CM

## 2023-12-18 ENCOUNTER — Other Ambulatory Visit: Payer: Self-pay | Admitting: Family Medicine

## 2023-12-18 DIAGNOSIS — G6289 Other specified polyneuropathies: Secondary | ICD-10-CM

## 2023-12-19 DIAGNOSIS — G8929 Other chronic pain: Secondary | ICD-10-CM | POA: Diagnosis not present

## 2023-12-19 DIAGNOSIS — M5416 Radiculopathy, lumbar region: Secondary | ICD-10-CM | POA: Diagnosis not present

## 2024-01-08 ENCOUNTER — Telehealth: Payer: Self-pay

## 2024-01-08 NOTE — Telephone Encounter (Signed)
 Unsuccessful attempts to reach patient on preferred number listed in notes for scheduled AWV. Left message on voicemail okay to reschedule.

## 2024-01-08 NOTE — Telephone Encounter (Signed)
 This encounter created an error. Please disregard.

## 2024-01-08 NOTE — Telephone Encounter (Signed)
 Patient missed her Medicare Annual Wellness Visit (AWV) appointment today. She mentioned that she had already canceled the appointment once and was unsure why it was rescheduled. I explained the purpose of the AWV and offered to reschedule the appointment for her. However, the patient declined, stating she already had someone from her insurance conduct a similar visit at her home. She felt the process was intrusive and mentioned that the agent spent an hour going over everything with her.

## 2024-01-16 ENCOUNTER — Encounter: Payer: Self-pay | Admitting: Family Medicine

## 2024-02-09 ENCOUNTER — Other Ambulatory Visit: Payer: Self-pay | Admitting: Family Medicine

## 2024-02-13 DIAGNOSIS — G8929 Other chronic pain: Secondary | ICD-10-CM | POA: Diagnosis not present

## 2024-02-13 DIAGNOSIS — M5416 Radiculopathy, lumbar region: Secondary | ICD-10-CM | POA: Diagnosis not present

## 2024-03-23 ENCOUNTER — Encounter: Payer: Self-pay | Admitting: Neurology

## 2024-03-23 ENCOUNTER — Ambulatory Visit: Payer: Medicare PPO | Admitting: Neurology

## 2024-03-23 VITALS — BP 116/74 | Resp 15 | Ht 65.0 in | Wt 175.5 lb

## 2024-03-23 DIAGNOSIS — R269 Unspecified abnormalities of gait and mobility: Secondary | ICD-10-CM | POA: Diagnosis not present

## 2024-03-23 DIAGNOSIS — M545 Low back pain, unspecified: Secondary | ICD-10-CM | POA: Diagnosis not present

## 2024-03-23 DIAGNOSIS — G8929 Other chronic pain: Secondary | ICD-10-CM | POA: Diagnosis not present

## 2024-03-23 DIAGNOSIS — R413 Other amnesia: Secondary | ICD-10-CM

## 2024-03-23 NOTE — Progress Notes (Unsigned)
 Chief Complaint  Patient presents with   New Patient (Initial Visit)    Rm12, alone, NP Internal referral for polyneuropathy: located in bilateral feet radiates up through legs and also in bilateral hands. Numbness, tingly, warmth is how the pt described it.   ASSESSMENT AND PLAN  Rebecca Baird is a 78 y.o. female   Gradual onset gait abnormalities Memory loss  MoCA examination 21/30, mother has suffered Alzheimer's disease,  MRI of the brain for evaluation  She has significant gait abnormality, mild proximal upper and lower extremity weakness, mild distal lower extremity weakness, hyperreflexia, with absent ankle reflex, plantar responses were extensor  Differentiation diagnosis includes cervical myelopathy with superimposed lumbar radiculopathy  MRI of cervical and lumbar spine  Home physical therapy  Return To Clinic I n 6 Months     DIAGNOSTIC DATA (LABS, IMAGING, TESTING) - I reviewed patient records, labs, notes, testing and imaging myself where available.   MEDICAL HISTORY:  Rebecca Baird seen in request by   [place], Betty G, MD    History is obtained from the patient and review of electronic medical records. I personally reviewed pertinent available imaging films in PACS.   PMHx of  HLD HTN GERD Memory loss,  History of right ankle fracture.  Memory loss.  Nucynta ER 200mg  bid, before pandemia 2018, from [place], [place], her husband died young, 11 days , he has to serve, move to [place] [place] to [employer], want to find a house did not have staires, accessible, look at [employer],   Her memory is big concern, deterioateing, she has been taking Nucyta [facility], go [place] every [place], need narcotics,  100 dollars every month,   Mmeory issues,   Neuropathy, from knee down, hands numbness x 2 years, cane use,   Husband rive her here, she still learning to drive Social research officer, government   Mouth is so dry, contact job,      Concern about her future, her mohter has AD, she is anxieous to have it,   She lives with her husband, drink wine daily,  one glass, more often two glass 8 Oz wine  :   Vitals:   03/23/24 1357  BP: 116/74  Resp: 15  Weight: 175 lb 8 oz (79.6 kg)  Height: 5\' 5"  (1.651 m)   Not recorded     Body mass index is 29.2 kg/m.  PHYSICAL EXAMNIATION:  Gen: NAD, conversant, well nourised, well groomed                     Cardiovascular: Regular rate rhythm, no peripheral edema, warm, nontender. Eyes: Conjunctivae clear without exudates or hemorrhage Neck: Supple, no carotid bruits. Pulmonary: Clear to auscultation bilaterally   NEUROLOGICAL EXAM:  MENTAL STATUS: Speech/cognition: Awake, alert, oriented to history taking and casual conversation    03/23/2024    2:00 PM  Montreal Cognitive Assessment   Visuospatial/ Executive (0/5) 4  Naming (0/3) 3  Attention: Read list of digits (0/2) 2  Attention: Read list of letters (0/1) 1  Attention: Serial 7 subtraction starting at 100 (0/3) 1  Language: Repeat phrase (0/2) 2  Language : Fluency (0/1) 1  Abstraction (0/2) 2  Delayed Recall (0/5) 0  Orientation (0/6) 5  Total 21    CRANIAL NERVES: CN II: Visual fields are full to confrontation. Pupils are round equal and briskly reactive to light. CN III, IV, VI: extraocular movement are normal. No ptosis. CN  V: Facial sensation is intact to light touch CN VII: Face is symmetric with normal eye closure  CN VIII: Hearing is normal to causal conversation. CN IX, X: Phonation is normal. CN XI: Head turning and shoulder shrug are intact  MOTOR: Mild bilateral shoulder abduction, external rotation weakness, mild bilateral hip flexion, mild bilateral ankle dorsiflexion, slight ankle plantarflexion weakness.  REFLEXES: Reflexes are 2+ and symmetric at the biceps, triceps, knees, and absent at ankles. Plantar responses are extensor bilaterally  SENSORY: Length-dependent  decreased vibratory sensation to midshin level, decreased pinprick to knee level, absent toe proprioception  COORDINATION: There is no trunk or limb dysmetria noted.  GAIT/STANCE: Push-up to get up from seated position, narrow base, stiff, unsteady, could not stand up on tiptoe heels, positive Romberg sign  REVIEW OF SYSTEMS:  Full 14 system review of systems performed and notable only for as above All other review of systems were negative.   ALLERGIES: Allergies  Allergen Reactions   Epinephrine Other (See Comments) and Palpitations    "Heart Races Uncomfortably"  Other Reaction(s): irregular heart rate  epinephrine    HOME MEDICATIONS: Current Outpatient Medications  Medication Sig Dispense Refill   alendronate  (FOSAMAX ) 70 MG tablet TAKE 1 TABLET BY MOUTH EVERY WEEK. TAKE WITH A FULL GLASS OF WATER ON AN EMPTY STOMACH. 12 tablet 2   Ascorbic Acid (VITAMIN C) 1000 MG tablet Take 1,000 mg by mouth daily.     aspirin  EC 81 MG tablet Take 81 mg by mouth daily. Swallow whole.     folic acid  (FOLVITE ) 800 MCG tablet Take 800 mcg by mouth daily.     furosemide  (LASIX ) 20 MG tablet TAKE 1 TABLET BY MOUTH EVERY DAY 90 tablet 2   gabapentin  (NEURONTIN ) 300 MG capsule TAKE 1 CAPSULE BY MOUTH TWICE A DAY 60 capsule 5   losartan  (COZAAR ) 25 MG tablet TAKE 1 TABLET (25 MG TOTAL) BY MOUTH DAILY. 90 tablet 2   metoprolol  succinate (TOPROL -XL) 25 MG 24 hr tablet TAKE 1 TABLET (25 MG TOTAL) BY MOUTH DAILY. 90 tablet 2   omeprazole  (PRILOSEC) 20 MG capsule TAKE 1 CAPSULE BY MOUTH EVERY DAY 90 capsule 1   pyridOXINE  (VITAMIN B-6) 100 MG tablet Take 100 mg by mouth daily.     rosuvastatin  (CRESTOR ) 20 MG tablet Take 1 tablet (20 mg total) by mouth daily. 90 tablet 3   Tapentadol HCl (NUCYNTA ER) 250 MG TB12 Take 1 tablet by mouth 2 (two) times daily.     thiamine  (VITAMIN B-1) 100 MG tablet Take 100 mg by mouth daily.     triamcinolone  cream (KENALOG ) 0.1 % APPLY TO LEGS TOPICALLY TWICE A DAY AS  NEEDED (Patient taking differently: Apply 1 application  topically 2 (two) times daily as needed (rash on legs).) 30 g 2   No current facility-administered medications for this visit.    PAST MEDICAL HISTORY: Past Medical History:  Diagnosis Date   Anxiety    Depression    GERD (gastroesophageal reflux disease)    Hypertension    Peripheral neuropathy     PAST SURGICAL HISTORY: Past Surgical History:  Procedure Laterality Date   broken ankle     cataract surgery     NOSE SURGERY      FAMILY HISTORY: Family History  Problem Relation Age of Onset   Breast cancer Mother    Breast cancer Sister 87   Breast cancer Cousin     SOCIAL HISTORY: Social History   Socioeconomic History  Marital status: Married    Spouse name: Not on file   Number of children: 1   Years of education: Not on file   Highest education level: Associate degree: academic program  Occupational History   Occupation: retired  Tobacco Use   Smoking status: Former    Current packs/day: 0.00    Types: Cigarettes    Quit date: 1984    Years since quitting: 41.3   Smokeless tobacco: Never  Vaping Use   Vaping status: Never Used  Substance and Sexual Activity   Alcohol use: Yes    Alcohol/week: 3.0 standard drinks of alcohol    Types: 3 Glasses of wine per week    Comment: Drinks Wine   Drug use: Yes    Types: Other-see comments    Comment: nucytha   Sexual activity: Not on file  Other Topics Concern   Not on file  Social History Narrative   Right handed   One story home   Drinks occasional caffeine   Social Drivers of Health   Financial Resource Strain: Low Risk  (12/28/2022)   Overall Financial Resource Strain (CARDIA)    Difficulty of Paying Living Expenses: Not hard at all  Food Insecurity: No Food Insecurity (12/28/2022)   Hunger Vital Sign    Worried About Running Out of Food in the Last Year: Never true    Ran Out of Food in the Last Year: Never true  Transportation Needs: No  Transportation Needs (12/28/2022)   PRAPARE - Administrator, Civil Service (Medical): No    Lack of Transportation (Non-Medical): No  Physical Activity: Sufficiently Active (12/28/2022)   Exercise Vital Sign    Days of Exercise per Week: 5 days    Minutes of Exercise per Session: 30 min  Stress: No Stress Concern Present (12/28/2022)   [employer] of Occupational Health - Occupational Stress Questionnaire    Feeling of Stress : Not at all  Social Connections: Socially Integrated (12/28/2022)   Social Connection and Isolation Panel [NHANES]    Frequency of Communication with Friends and Family: More than three times a week    Frequency of Social Gatherings with Friends and Family: More than three times a week    Attends Religious Services: More than 4 times per year    Active Member of [employer] or Organizations: Yes    Attends Banker Meetings: More than 4 times per year    Marital Status: Married  Catering manager Violence: Not At Risk (12/28/2022)   Humiliation, Afraid, Rape, and Kick questionnaire    Fear of Current or Ex-Partner: No    Emotionally Abused: No    Physically Abused: No    Sexually Abused: No      Phebe Brasil, M.D. Ph.D.  [facility] [address], [address] [place], [place] [address] Ph: [phone] Fax: [phone]  CC:  [place], Betty G, MD [address] [place],  [place] [address]  [place], Betty G, MD

## 2024-03-24 ENCOUNTER — Telehealth: Payer: Self-pay | Admitting: Neurology

## 2024-03-24 NOTE — Telephone Encounter (Signed)
[facility] will be taking this patient.

## 2024-03-27 ENCOUNTER — Telehealth: Payer: Self-pay | Admitting: Neurology

## 2024-03-27 DIAGNOSIS — G8929 Other chronic pain: Secondary | ICD-10-CM | POA: Diagnosis not present

## 2024-03-27 DIAGNOSIS — M545 Low back pain, unspecified: Secondary | ICD-10-CM | POA: Diagnosis not present

## 2024-03-27 DIAGNOSIS — F0283 Dementia in other diseases classified elsewhere, unspecified severity, with mood disturbance: Secondary | ICD-10-CM | POA: Diagnosis not present

## 2024-03-27 DIAGNOSIS — F0284 Dementia in other diseases classified elsewhere, unspecified severity, with anxiety: Secondary | ICD-10-CM | POA: Diagnosis not present

## 2024-03-27 DIAGNOSIS — Z87891 Personal history of nicotine dependence: Secondary | ICD-10-CM | POA: Diagnosis not present

## 2024-03-27 DIAGNOSIS — G309 Alzheimer's disease, unspecified: Secondary | ICD-10-CM | POA: Diagnosis not present

## 2024-03-27 DIAGNOSIS — I10 Essential (primary) hypertension: Secondary | ICD-10-CM | POA: Diagnosis not present

## 2024-03-27 DIAGNOSIS — G629 Polyneuropathy, unspecified: Secondary | ICD-10-CM | POA: Diagnosis not present

## 2024-03-27 DIAGNOSIS — F32A Depression, unspecified: Secondary | ICD-10-CM | POA: Diagnosis not present

## 2024-03-27 NOTE — Telephone Encounter (Signed)
 Rebecca Baird: [phone] exp. 03/24/24-05/23/24 sent to [facility] [phone]

## 2024-03-30 DIAGNOSIS — M5416 Radiculopathy, lumbar region: Secondary | ICD-10-CM | POA: Diagnosis not present

## 2024-03-30 DIAGNOSIS — G8929 Other chronic pain: Secondary | ICD-10-CM | POA: Diagnosis not present

## 2024-03-31 DIAGNOSIS — F32A Depression, unspecified: Secondary | ICD-10-CM | POA: Diagnosis not present

## 2024-03-31 DIAGNOSIS — F0283 Dementia in other diseases classified elsewhere, unspecified severity, with mood disturbance: Secondary | ICD-10-CM | POA: Diagnosis not present

## 2024-03-31 DIAGNOSIS — Z87891 Personal history of nicotine dependence: Secondary | ICD-10-CM | POA: Diagnosis not present

## 2024-03-31 DIAGNOSIS — G8929 Other chronic pain: Secondary | ICD-10-CM | POA: Diagnosis not present

## 2024-03-31 DIAGNOSIS — F0284 Dementia in other diseases classified elsewhere, unspecified severity, with anxiety: Secondary | ICD-10-CM | POA: Diagnosis not present

## 2024-03-31 DIAGNOSIS — G309 Alzheimer's disease, unspecified: Secondary | ICD-10-CM | POA: Diagnosis not present

## 2024-03-31 DIAGNOSIS — M545 Low back pain, unspecified: Secondary | ICD-10-CM | POA: Diagnosis not present

## 2024-03-31 DIAGNOSIS — I10 Essential (primary) hypertension: Secondary | ICD-10-CM | POA: Diagnosis not present

## 2024-03-31 DIAGNOSIS — G629 Polyneuropathy, unspecified: Secondary | ICD-10-CM | POA: Diagnosis not present

## 2024-04-02 DIAGNOSIS — F32A Depression, unspecified: Secondary | ICD-10-CM | POA: Diagnosis not present

## 2024-04-02 DIAGNOSIS — G629 Polyneuropathy, unspecified: Secondary | ICD-10-CM | POA: Diagnosis not present

## 2024-04-02 DIAGNOSIS — G8929 Other chronic pain: Secondary | ICD-10-CM | POA: Diagnosis not present

## 2024-04-02 DIAGNOSIS — F0284 Dementia in other diseases classified elsewhere, unspecified severity, with anxiety: Secondary | ICD-10-CM | POA: Diagnosis not present

## 2024-04-02 DIAGNOSIS — I10 Essential (primary) hypertension: Secondary | ICD-10-CM | POA: Diagnosis not present

## 2024-04-02 DIAGNOSIS — M545 Low back pain, unspecified: Secondary | ICD-10-CM | POA: Diagnosis not present

## 2024-04-02 DIAGNOSIS — G309 Alzheimer's disease, unspecified: Secondary | ICD-10-CM | POA: Diagnosis not present

## 2024-04-02 DIAGNOSIS — Z87891 Personal history of nicotine dependence: Secondary | ICD-10-CM | POA: Diagnosis not present

## 2024-04-02 DIAGNOSIS — F0283 Dementia in other diseases classified elsewhere, unspecified severity, with mood disturbance: Secondary | ICD-10-CM | POA: Diagnosis not present

## 2024-04-07 DIAGNOSIS — G629 Polyneuropathy, unspecified: Secondary | ICD-10-CM | POA: Diagnosis not present

## 2024-04-07 DIAGNOSIS — G309 Alzheimer's disease, unspecified: Secondary | ICD-10-CM | POA: Diagnosis not present

## 2024-04-07 DIAGNOSIS — F0284 Dementia in other diseases classified elsewhere, unspecified severity, with anxiety: Secondary | ICD-10-CM | POA: Diagnosis not present

## 2024-04-07 DIAGNOSIS — G8929 Other chronic pain: Secondary | ICD-10-CM | POA: Diagnosis not present

## 2024-04-07 DIAGNOSIS — M545 Low back pain, unspecified: Secondary | ICD-10-CM | POA: Diagnosis not present

## 2024-04-07 DIAGNOSIS — F0283 Dementia in other diseases classified elsewhere, unspecified severity, with mood disturbance: Secondary | ICD-10-CM | POA: Diagnosis not present

## 2024-04-07 DIAGNOSIS — I10 Essential (primary) hypertension: Secondary | ICD-10-CM | POA: Diagnosis not present

## 2024-04-07 DIAGNOSIS — F32A Depression, unspecified: Secondary | ICD-10-CM | POA: Diagnosis not present

## 2024-04-07 DIAGNOSIS — Z87891 Personal history of nicotine dependence: Secondary | ICD-10-CM | POA: Diagnosis not present

## 2024-04-09 DIAGNOSIS — G629 Polyneuropathy, unspecified: Secondary | ICD-10-CM | POA: Diagnosis not present

## 2024-04-09 DIAGNOSIS — M545 Low back pain, unspecified: Secondary | ICD-10-CM | POA: Diagnosis not present

## 2024-04-09 DIAGNOSIS — F0283 Dementia in other diseases classified elsewhere, unspecified severity, with mood disturbance: Secondary | ICD-10-CM | POA: Diagnosis not present

## 2024-04-09 DIAGNOSIS — G309 Alzheimer's disease, unspecified: Secondary | ICD-10-CM | POA: Diagnosis not present

## 2024-04-09 DIAGNOSIS — G8929 Other chronic pain: Secondary | ICD-10-CM | POA: Diagnosis not present

## 2024-04-09 DIAGNOSIS — Z87891 Personal history of nicotine dependence: Secondary | ICD-10-CM | POA: Diagnosis not present

## 2024-04-09 DIAGNOSIS — F0284 Dementia in other diseases classified elsewhere, unspecified severity, with anxiety: Secondary | ICD-10-CM | POA: Diagnosis not present

## 2024-04-09 DIAGNOSIS — I10 Essential (primary) hypertension: Secondary | ICD-10-CM | POA: Diagnosis not present

## 2024-04-09 DIAGNOSIS — F32A Depression, unspecified: Secondary | ICD-10-CM | POA: Diagnosis not present

## 2024-04-11 ENCOUNTER — Other Ambulatory Visit: Payer: Self-pay | Admitting: Family Medicine

## 2024-04-11 DIAGNOSIS — N1832 Chronic kidney disease, stage 3b: Secondary | ICD-10-CM

## 2024-04-11 DIAGNOSIS — I10 Essential (primary) hypertension: Secondary | ICD-10-CM

## 2024-04-13 DIAGNOSIS — I10 Essential (primary) hypertension: Secondary | ICD-10-CM | POA: Diagnosis not present

## 2024-04-13 DIAGNOSIS — Z87891 Personal history of nicotine dependence: Secondary | ICD-10-CM | POA: Diagnosis not present

## 2024-04-13 DIAGNOSIS — F0283 Dementia in other diseases classified elsewhere, unspecified severity, with mood disturbance: Secondary | ICD-10-CM | POA: Diagnosis not present

## 2024-04-13 DIAGNOSIS — F0284 Dementia in other diseases classified elsewhere, unspecified severity, with anxiety: Secondary | ICD-10-CM | POA: Diagnosis not present

## 2024-04-13 DIAGNOSIS — M545 Low back pain, unspecified: Secondary | ICD-10-CM | POA: Diagnosis not present

## 2024-04-13 DIAGNOSIS — G309 Alzheimer's disease, unspecified: Secondary | ICD-10-CM | POA: Diagnosis not present

## 2024-04-13 DIAGNOSIS — G629 Polyneuropathy, unspecified: Secondary | ICD-10-CM | POA: Diagnosis not present

## 2024-04-13 DIAGNOSIS — G8929 Other chronic pain: Secondary | ICD-10-CM | POA: Diagnosis not present

## 2024-04-13 DIAGNOSIS — F32A Depression, unspecified: Secondary | ICD-10-CM | POA: Diagnosis not present

## 2024-04-16 DIAGNOSIS — F0284 Dementia in other diseases classified elsewhere, unspecified severity, with anxiety: Secondary | ICD-10-CM | POA: Diagnosis not present

## 2024-04-16 DIAGNOSIS — Z87891 Personal history of nicotine dependence: Secondary | ICD-10-CM | POA: Diagnosis not present

## 2024-04-16 DIAGNOSIS — M545 Low back pain, unspecified: Secondary | ICD-10-CM | POA: Diagnosis not present

## 2024-04-16 DIAGNOSIS — G629 Polyneuropathy, unspecified: Secondary | ICD-10-CM | POA: Diagnosis not present

## 2024-04-16 DIAGNOSIS — G309 Alzheimer's disease, unspecified: Secondary | ICD-10-CM | POA: Diagnosis not present

## 2024-04-16 DIAGNOSIS — G8929 Other chronic pain: Secondary | ICD-10-CM | POA: Diagnosis not present

## 2024-04-16 DIAGNOSIS — I10 Essential (primary) hypertension: Secondary | ICD-10-CM | POA: Diagnosis not present

## 2024-04-16 DIAGNOSIS — F32A Depression, unspecified: Secondary | ICD-10-CM | POA: Diagnosis not present

## 2024-04-16 DIAGNOSIS — F0283 Dementia in other diseases classified elsewhere, unspecified severity, with mood disturbance: Secondary | ICD-10-CM | POA: Diagnosis not present

## 2024-04-17 ENCOUNTER — Ambulatory Visit
Admission: RE | Admit: 2024-04-17 | Discharge: 2024-04-17 | Disposition: A | Discharge status: Home / Self Care | Payer: Medicare PPO | Source: Ambulatory Visit | Attending: Family Medicine | Admitting: Family Medicine

## 2024-04-17 DIAGNOSIS — Z1231 Encounter for screening mammogram for malignant neoplasm of breast: Secondary | ICD-10-CM | POA: Diagnosis not present

## 2024-04-20 ENCOUNTER — Ambulatory Visit
Admission: RE | Admit: 2024-04-20 | Discharge: 2024-04-20 | Disposition: A | Discharge status: Home / Self Care | Source: Ambulatory Visit | Attending: Neurology | Admitting: Neurology

## 2024-04-20 DIAGNOSIS — R413 Other amnesia: Secondary | ICD-10-CM

## 2024-04-20 DIAGNOSIS — M545 Low back pain, unspecified: Secondary | ICD-10-CM

## 2024-04-20 DIAGNOSIS — R269 Unspecified abnormalities of gait and mobility: Secondary | ICD-10-CM

## 2024-04-20 DIAGNOSIS — G8929 Other chronic pain: Secondary | ICD-10-CM

## 2024-04-21 ENCOUNTER — Telehealth: Payer: Self-pay | Admitting: Neurology

## 2024-04-21 DIAGNOSIS — R269 Unspecified abnormalities of gait and mobility: Secondary | ICD-10-CM

## 2024-04-21 DIAGNOSIS — R413 Other amnesia: Secondary | ICD-10-CM

## 2024-04-21 DIAGNOSIS — G8929 Other chronic pain: Secondary | ICD-10-CM

## 2024-04-21 NOTE — Telephone Encounter (Signed)
 Called and relayed information to pt and her husband they scheduled mychart visit to go over results in more detail

## 2024-04-21 NOTE — Telephone Encounter (Signed)
 Orders received from [facility] on 04/17/24  Orders signed & faxed back to [phone] on 04/21/24

## 2024-04-21 NOTE — Telephone Encounter (Signed)
 Please call patient, MRI of the brain showed mild age-related changes no acute abnormality  MRI of cervical spine showed multilevel degenerative changes most prominent at C3-4, where there is evidence of mild to moderate canal stenosis,  MRI of the lumbar spine also showed multilevel degenerative changes, no evidence of spinal cord compression, with variable degree of foraminal narrowing  With her reported slow onset gait abnormality, hand and feet paresthesia, I would like to refer her to neurosurgeon for evaluation of her cervical issues  If she has a lot of question okay to set up with virtual in person (preferred) visit to go over findings   IMPRESSION: MRI scan of cervical spine without contrast showing prominent spondylitic changes from C3-C6 most prominent at C3-4 where there is mild cord compression and moderate bilateral foraminal narrowing.  At C5-6 also there is severe left-sided and moderate right-sided foraminal narrowing.    IMPRESSION: MRI scan of the brain without contrast showing mild age-related changes of generalized cerebral atrophy and incidental degenerative changes in upper cervical spine as described above.    IMPRESSION: MRI scan lumbar spine without contrast showing prominence spondylitic and facet degenerative changes throughout most prominent at L4-5 where there is moderate facet hypertrophy and mild spinal canal stenosis and at L2-3 where there is mild posterior canal and left-sided foraminal narrowing but no definite nerve root compression.  Overall no significant change compared with previous MRI from 11/03/2021.

## 2024-04-22 ENCOUNTER — Ambulatory Visit: Admitting: Family Medicine

## 2024-04-22 DIAGNOSIS — F0284 Dementia in other diseases classified elsewhere, unspecified severity, with anxiety: Secondary | ICD-10-CM | POA: Diagnosis not present

## 2024-04-22 DIAGNOSIS — F32A Depression, unspecified: Secondary | ICD-10-CM | POA: Diagnosis not present

## 2024-04-22 DIAGNOSIS — M545 Low back pain, unspecified: Secondary | ICD-10-CM | POA: Diagnosis not present

## 2024-04-22 DIAGNOSIS — G629 Polyneuropathy, unspecified: Secondary | ICD-10-CM | POA: Diagnosis not present

## 2024-04-22 DIAGNOSIS — G309 Alzheimer's disease, unspecified: Secondary | ICD-10-CM | POA: Diagnosis not present

## 2024-04-22 DIAGNOSIS — G8929 Other chronic pain: Secondary | ICD-10-CM | POA: Diagnosis not present

## 2024-04-22 DIAGNOSIS — Z87891 Personal history of nicotine dependence: Secondary | ICD-10-CM | POA: Diagnosis not present

## 2024-04-22 DIAGNOSIS — F0283 Dementia in other diseases classified elsewhere, unspecified severity, with mood disturbance: Secondary | ICD-10-CM | POA: Diagnosis not present

## 2024-04-22 DIAGNOSIS — I10 Essential (primary) hypertension: Secondary | ICD-10-CM | POA: Diagnosis not present

## 2024-04-23 ENCOUNTER — Other Ambulatory Visit: Payer: Self-pay | Admitting: Family Medicine

## 2024-04-23 ENCOUNTER — Telehealth: Payer: Self-pay | Admitting: Neurology

## 2024-04-23 ENCOUNTER — Telehealth: Admitting: Neurology

## 2024-04-23 DIAGNOSIS — R269 Unspecified abnormalities of gait and mobility: Secondary | ICD-10-CM | POA: Diagnosis not present

## 2024-04-23 DIAGNOSIS — M4802 Spinal stenosis, cervical region: Secondary | ICD-10-CM

## 2024-04-23 DIAGNOSIS — R4189 Other symptoms and signs involving cognitive functions and awareness: Secondary | ICD-10-CM

## 2024-04-23 DIAGNOSIS — R928 Other abnormal and inconclusive findings on diagnostic imaging of breast: Secondary | ICD-10-CM

## 2024-04-23 MED ORDER — MEMANTINE HCL 10 MG PO TABS
10.0000 mg | ORAL_TABLET | Freq: Two times a day (BID) | ORAL | 11 refills | Status: DC
Start: 2024-04-23 — End: 2024-05-18

## 2024-04-23 NOTE — Progress Notes (Signed)
 ASSESSMENT AND PLAN  Rebecca Baird is a 78 y.o. female     Memory loss  MoCA examination 21/30, mother has suffered Alzheimer's disease,  MRI of the brain for evaluation showed generalized atrophy  Most concern for central nervous system degenerative disorder such as Alzheimer's disease,  Will consider more extensive Alzheimer related testing at next in person visit  Gradual worsening gait abnormality  MRI of cervical spine showed moderate canal stenosis at C3-4,  Previous examination showed significant gait abnormality, mild proximal upper and lower extremity weakness, mild distal lower extremity weakness, hyperreflexia,  plantar responses were extensor  Will refer her to neurosurgeon for evaluation, potential candidate for cervical decompression    DIAGNOSTIC DATA (LABS, IMAGING, TESTING) - I reviewed patient records, labs, notes, testing and imaging myself where available.   MEDICAL HISTORY:  Rebecca Baird is a 78 year old female seen in request by her primary care doctor Rebecca Baird, Rebecca Baird for evaluation of bilateral feet paresthesia, memory loss, initial evaluation was on Mar 23, 2024    History is obtained from the patient and review of electronic medical records. I personally reviewed pertinent available imaging films in PACS.   PMHx of  HLD HTN GERD Memory loss,  History of right ankle fracture.  She lives with her husband, drinking 6 to 8 ounce wines daily, mother has Alzheimer's, is a retired Midwife, over the past couple years, she noticed slow onset memory loss, she is very anxious about it, MoCA examination 21/30, missed 5 out of 5 delayed recall  She still sees her primary care at [facility], she [place] years back, had 2 years history of gradual onset bilateral hands and feet paresthesia, ascending, to knee level now, mild gait abnormality, have to use cane, she denies significant bowel and bladder  incontinence   Virtual Visit via video UPDATE April 23 2024: I discussed the limitations of evaluation and management by telemedicine and the availability of in person appointments. The patient expressed understanding and agreed to proceed  Location: Provider: [facility] office; Patient: Home with her husband  I connected with Rebecca Baird  on April 23 2024 by a video enabled telemedicine application and verified that I am speaking with the correct person using two identifiers.  UPDATED HiSTORY She is with her husband at today's virtual visit, to review MRIs, MRI of the brain, mild atrophy, no acute abnormality MRI of cervical spine multilevel degenerative changes most noticeable at C3-4, moderate canal stenosis no cord signal abnormality MRI of the lumbar spine multilevel degenerative changes most prominent at L4-5, mild canal stenosis, variable degree of foraminal narrowing  She does complain slow worsening gait abnormality, there is evidence of hyperreflexia unsteady gait on examination, she is also very concerned about her memory loss   Observations/Objective:  I have reviewed problem lists, medications, allergies.  Assessment and Plan: Awake, alert, but rely on her husband to provide history and keep track of the information, facial symmetric, no dysarthria,  REVIEW OF SYSTEMS:  Full 14 system review of systems performed and notable only for as above All other review of systems were negative.   ALLERGIES: Allergies  Allergen Reactions   Epinephrine Other (See Comments) and Palpitations    Heart Races Uncomfortably  Other Reaction(s): irregular heart rate  epinephrine    HOME MEDICATIONS: Current Outpatient Medications  Medication Sig Dispense Refill   alendronate  (FOSAMAX ) 70 MG tablet TAKE 1 TABLET BY MOUTH EVERY WEEK. TAKE WITH A FULL  GLASS OF WATER ON AN EMPTY STOMACH. 12 tablet 2   Ascorbic Acid (VITAMIN C) 1000 MG tablet Take 1,000 mg by mouth daily.      aspirin  EC 81 MG tablet Take 81 mg by mouth daily. Swallow whole.     folic acid  (FOLVITE ) 800 MCG tablet Take 800 mcg by mouth daily.     furosemide  (LASIX ) 20 MG tablet TAKE 1 TABLET BY MOUTH EVERY DAY 90 tablet 2   gabapentin  (NEURONTIN ) 300 MG capsule TAKE 1 CAPSULE BY MOUTH TWICE A DAY 60 capsule 5   losartan  (COZAAR ) 25 MG tablet TAKE 1 TABLET (25 MG TOTAL) BY MOUTH DAILY. 90 tablet 2   metoprolol  succinate (TOPROL -XL) 25 MG 24 hr tablet TAKE 1 TABLET (25 MG TOTAL) BY MOUTH DAILY. 90 tablet 2   omeprazole  (PRILOSEC) 20 MG capsule TAKE 1 CAPSULE BY MOUTH EVERY DAY 90 capsule 1   pyridOXINE  (VITAMIN B-6) 100 MG tablet Take 100 mg by mouth daily.     rosuvastatin  (CRESTOR ) 20 MG tablet Take 1 tablet (20 mg total) by mouth daily. 90 tablet 3   Tapentadol HCl (NUCYNTA ER) 250 MG TB12 Take 1 tablet by mouth 2 (two) times daily.     thiamine  (VITAMIN B-1) 100 MG tablet Take 100 mg by mouth daily.     triamcinolone  cream (KENALOG ) 0.1 % APPLY TO LEGS TOPICALLY TWICE A DAY AS NEEDED (Patient taking differently: Apply 1 application  topically 2 (two) times daily as needed (rash on legs).) 30 g 2   No current facility-administered medications for this visit.    PAST MEDICAL HISTORY: Past Medical History:  Diagnosis Date   Anxiety    Depression    GERD (gastroesophageal reflux disease)    Hypertension    Peripheral neuropathy     PAST SURGICAL HISTORY: Past Surgical History:  Procedure Laterality Date   broken ankle     cataract surgery     NOSE SURGERY      FAMILY HISTORY: Family History  Problem Relation Age of Onset   Breast cancer Mother    Breast cancer Sister 70   Breast cancer Cousin     SOCIAL HISTORY: Social History   Socioeconomic History   Marital status: Married    Spouse name: Not on file   Number of children: 1   Years of education: Not on file   Highest education level: Associate degree: academic program  Occupational History   Occupation: retired   Tobacco Use   Smoking status: Former    Current packs/day: 0.00    Types: Cigarettes    Quit date: 1984    Years since quitting: 41.4   Smokeless tobacco: Never  Vaping Use   Vaping status: Never Used  Substance and Sexual Activity   Alcohol use: Yes    Alcohol/week: 3.0 standard drinks of alcohol    Types: 3 Glasses of wine per week    Comment: Drinks Wine   Drug use: Yes    Types: Other-see comments    Comment: nucytha   Sexual activity: Not on file  Other Topics Concern   Not on file  Social History Narrative   Right handed   One story home   Drinks occasional caffeine   Social Drivers of Health   Financial Resource Strain: Low Risk  (12/28/2022)   Overall Financial Resource Strain (CARDIA)    Difficulty of Paying Living Expenses: Not hard at all  Food Insecurity: No Food Insecurity (12/28/2022)   Hunger Vital Sign  Worried About Programme researcher, broadcasting/film/video in the Last Year: Never true    Ran Out of Food in the Last Year: Never true  Transportation Needs: No Transportation Needs (12/28/2022)   PRAPARE - Administrator, Civil Service (Medical): No    Lack of Transportation (Non-Medical): No  Physical Activity: Sufficiently Active (12/28/2022)   Exercise Vital Sign    Days of Exercise per Week: 5 days    Minutes of Exercise per Session: 30 min  Stress: No Stress Concern Present (12/28/2022)   [employer] of Occupational Health - Occupational Stress Questionnaire    Feeling of Stress : Not at all  Social Connections: Socially Integrated (12/28/2022)   Social Connection and Isolation Panel    Frequency of Communication with Friends and Family: More than three times a week    Frequency of Social Gatherings with Friends and Family: More than three times a week    Attends Religious Services: More than 4 times per year    Active Member of [employer] or Organizations: Yes    Attends Banker Meetings: More than 4 times per year    Marital Status: Married   Catering manager Violence: Not At Risk (12/28/2022)   Humiliation, Afraid, Rape, and Kick questionnaire    Fear of Current or Ex-Partner: No    Emotionally Abused: No    Physically Abused: No    Sexually Abused: No      Phebe Brasil, M.D. Ph.D.  [facility] [address] [place], [place] [address] Ph: [phone] Fax: [phone]  CC:  Rebecca Baird, Rebecca Baird G, MD [address] [place],  [place] [address]  Rebecca Baird, Rebecca Baird G, MD

## 2024-04-23 NOTE — Telephone Encounter (Signed)
 Pt called to cancel appt   Due not being able to work [employer]

## 2024-04-24 ENCOUNTER — Telehealth: Payer: Self-pay | Admitting: Neurology

## 2024-04-24 NOTE — Telephone Encounter (Signed)
 [facility] called to informed  Dr. Gracie Lav that Pt is requesting to delay speech  therapy evaluation until next week .    [facility] Phone #[phone]

## 2024-04-27 NOTE — Progress Notes (Unsigned)
 Referring Physician:  Phebe Brasil, MD [address] [place],  [place] [address]  Primary Physician:  [place], Betty G, MD  History of Present Illness: 04/29/2024 Ms. Rebecca Baird is here today with a chief complaint of progressive cervical myelopathy.  She has pain that radiates from her neck all the way down her spine.  She also has back pain that radiates down her bilateral lower extremities.  She has been dropping things.  Losing dexterity in her hands.  Does have been having difficulty walking with decreased balance.  She does have a history of dementia in her family, has had some mild cognitive decrease as well.  Her husband is here to help with some of the history taking.  Her gait has been getting progressively worse.  The symptoms are causing a significant impact on the patient's life.   I have utilized the care everywhere function in epic to review the outside records available from external health systems.  Review of Systems:  A 10 point review of systems is negative, except for the pertinent positives and negatives detailed in the HPI.  Past Medical History: Past Medical History:  Diagnosis Date   Anxiety    Depression    GERD (gastroesophageal reflux disease)    Hypertension    Peripheral neuropathy     Past Surgical History: Past Surgical History:  Procedure Laterality Date   broken ankle     cataract surgery     NOSE SURGERY      Allergies: Allergies as of 04/29/2024 - Review Complete 04/29/2024  Allergen Reaction Noted   Epinephrine Other (See Comments) and Palpitations 04/04/2016    Medications:  Current Outpatient Medications:    alendronate  (FOSAMAX ) 70 MG tablet, TAKE 1 TABLET BY MOUTH EVERY WEEK. TAKE WITH A FULL GLASS OF WATER ON AN EMPTY STOMACH., Disp: 12 tablet, Rfl: 2   Ascorbic Acid (VITAMIN C) 1000 MG tablet, Take 1,000 mg by mouth daily., Disp: , Rfl:    aspirin  EC 81 MG tablet, Take 81 mg by mouth daily. Swallow whole., Disp: ,  Rfl:    folic acid  (FOLVITE ) 800 MCG tablet, Take 800 mcg by mouth daily., Disp: , Rfl:    furosemide  (LASIX ) 20 MG tablet, TAKE 1 TABLET BY MOUTH EVERY DAY, Disp: 90 tablet, Rfl: 2   gabapentin  (NEURONTIN ) 300 MG capsule, TAKE 1 CAPSULE BY MOUTH TWICE A DAY, Disp: 60 capsule, Rfl: 5   losartan  (COZAAR ) 25 MG tablet, TAKE 1 TABLET (25 MG TOTAL) BY MOUTH DAILY., Disp: 90 tablet, Rfl: 2   memantine  (NAMENDA ) 10 MG tablet, Take 1 tablet (10 mg total) by mouth 2 (two) times daily., Disp: 60 tablet, Rfl: 11   metoprolol  succinate (TOPROL -XL) 25 MG 24 hr tablet, TAKE 1 TABLET (25 MG TOTAL) BY MOUTH DAILY., Disp: 90 tablet, Rfl: 2   omeprazole  (PRILOSEC) 20 MG capsule, TAKE 1 CAPSULE BY MOUTH EVERY DAY, Disp: 90 capsule, Rfl: 1   pyridOXINE  (VITAMIN B-6) 100 MG tablet, Take 100 mg by mouth daily., Disp: , Rfl:    rosuvastatin  (CRESTOR ) 20 MG tablet, Take 1 tablet (20 mg total) by mouth daily., Disp: 90 tablet, Rfl: 3   Tapentadol HCl (NUCYNTA ER) 250 MG TB12, Take 1 tablet by mouth 2 (two) times daily., Disp: , Rfl:    thiamine  (VITAMIN B-1) 100 MG tablet, Take 100 mg by mouth daily., Disp: , Rfl:    triamcinolone  cream (KENALOG ) 0.1 %, APPLY TO LEGS TOPICALLY TWICE A DAY AS NEEDED,  Disp: 30 g, Rfl: 2  Social History: Social History   Tobacco Use   Smoking status: Former    Current packs/day: 0.00    Types: Cigarettes    Quit date: 1984    Years since quitting: 41.4   Smokeless tobacco: Never  Vaping Use   Vaping status: Never Used  Substance Use Topics   Alcohol use: Yes    Alcohol/week: 3.0 standard drinks of alcohol    Types: 3 Glasses of wine per week    Comment: Drinks Wine   Drug use: Yes    Types: Other-see comments    Comment: nucytha    Family Medical History: Family History  Problem Relation Age of Onset   Breast cancer Mother    Breast cancer Sister 34   Breast cancer Cousin     Physical Examination: Vitals:   04/29/24 1109  BP: 132/76    General: Patient is in  no apparent distress. Attention to examination is appropriate.  Neck:   Supple.  Full range of motion.  Respiratory: Patient is breathing without any difficulty.   NEUROLOGICAL:     Awake, alert, oriented to person, place, and time.  Speech is clear and fluent.   Cranial Nerves: Pupils equal round and reactive to light.  Facial tone is symmetric.  Facial sensation is symmetric. Shoulder shrug is symmetric. Tongue protrusion is midline.    Strength: Physical examination today she shows 4 out of 5 shoulder abduction, handgrip, hip flexion and dorsiflexion.  Very mild plantarflexion weakness noted bilaterally.  Reflexes are she is 3+ on the left in the upper and lower extremity, 2+ on the right in the upper lower extremity.  Florid Hoffmann sign on the left, unable to relax enough for true clonus testing.  She does have crossed adductor reflexes that seems more brisk on the left than the right.  Decree sensation in the hands and feet     Gait is clearly abnormal, patient is difficult with baseline gait, has difficulty standing from seated.  Is unsteady while walking.  Did not check tandem gait due to baseline gait difficulties.  Imaging: NEUROIMAGING REPORT     STUDY DATE: 04/20/2024 PATIENT NAME: [name] DOB: [birth year] MRN: [id number]   ORDERING CLINICIAN: Dr. Gracie Lav CLINICAL HISTORY: 78 year old patient being evaluated for gait abnormality COMPARISON FILMS: None EXAM: MRI cervical spine without contrast TECHNIQUE: Sagittal T1, T2, STIR and axial T2 and T2 medic images were obtained through cervical spine CONTRAST: None IMAGING SITE: [facility]   FINDINGS:  The cervical vertebrae demonstrate abnormal alignment with marked posterior subluxation of C4 over C3 vertebrae with prominent spondylitic and endplate degenerative changes noted throughout but normal body height and slight loss of bright bone marrow signal throughout. C2-3 shows mild disc degenerative change  but without significant compression. C3-4 shows broad-based disc osteophyte protrusion along with ligamentum flavum hypertrophy and facet hypertrophy resulting in mild cord compression and moderate bilateral foraminal narrowing and there is subtle spinal cord signal hyperintensity at this level. C4-5.  Also shows prominent spondylitic change resulting in broad-based disc protrusion with effacement of the thecal sac ventrally and moderate right and mild left-sided foraminal narrowing. C5-6 shows prominent spondylitic changes with broad-based disc osteophyte protrusion and ligamentum flavum hypertrophy resulting in mild canal and severe left-sided foraminal and moderate right-sided foraminal narrowing. C6/7 also shows prominent disc osteophyte protrusion but no significant narrowing. C7-T1 also shows minor disc degenerative change. The spinal cord parenchyma shows subtle hyperintense changes at C3-4.  The spinal canal dimensions appear adequate except at C3-4.  The visualized portion of lower brainstem and craniovertebral junction appear unremarkable.  Visualized portion of the thoracic spine also shows prominent disc degenerative changes.  Paraspinal soft tissues appear unremarkable.   IMPRESSION: MRI scan of cervical spine without contrast showing prominent spondylitic changes from C3-C6 most prominent at C3-4 where there is mild cord compression and moderate bilateral foraminal narrowing.  At C5-6 also there is severe left-sided and moderate right-sided foraminal narrowing.       INTERPRETING PHYSICIAN:  Ardella Beaver, MD Certified in  Neuroimaging by American Society of Neuroimaging and [employer] Neurological Subspecialities   I have personally reviewed the images and agree with the above interpretation.  Outside record review 03/23/2024, neurology, Dr. Gracie Lav.  Concern for progressive cervical myelopathy.  MRI ordered, plan for referral to neurosurgery  Medical Decision Making/Assessment  and Plan: Ms. Roulhac is a pleasant 78 y.o. female with history of some mild cognitive impairment, but worsened ambulatory issues that have been progressive and interfering with her quality of life.  She has had progressive gait difficulty and difficulty getting from seated to standing without falling.  She has loss of function in her hands and feet.  She states that she has been dropping things.  She feels weak in her arms and legs.  She does get burning dysesthesias in her hands and feet.  She gets Lhermitte's-like phenomenon.  On physical examination she is shows hyperreflexia left worse than right and diffuse weakness in a mostly upper motor neuron pattern with shoulder and hand weakness as well as hip and ankle weakness.  There is some mid sparing.  She is unable to walk in tandem, is unsteady at baseline.  Her MRI demonstrates severe cervical stenosis at C3-4 with T2 signal change at this level.  She has other levels of spondylosis and degenerative joint disease, however most severe level is at C3-4.  I plan to reach out to talk with Dr. Gracie Lav about her case.  It seems that C3-4 is the most pertinent level at this point, wediscussed a anterior approach to the C3-4 level via a C3-4 anterior cervical discectomy and fusion.  We also discussed that she has multiple levels of spondylosis and tightness at other levels however these would require a much more invasive posterior type reconstructive approach.  She states that she would not be interested in a large posterior surgery, but would entertain the idea of a anterior cervical surgery if this gave her the chance to slow her worsening or potentially improve it.  I will plan on getting flexion-extension x-rays to evaluate the adjacent levels to the C3-4 level.  I will also reach out to Dr. Gracie Lav.  Once I have had both of these performed I will plan on reaching out to the patient for discussion on plans going forward.  We did discuss at this point that cervical  intervention at the C3-4 level puts her at a slightly higher risk for swallowing difficulties and throat soreness and changes of sensation in the swallowing mechanism, we also discussed the regular risks of cervical spine surgery including but not limited to weakness numbness tingling need for further surgery failure of fusion.  Thank you for involving me in the care of this patient.    Carroll Clamp MD/MSCR Neurosurgery

## 2024-04-27 NOTE — Telephone Encounter (Signed)
 noted

## 2024-04-29 ENCOUNTER — Ambulatory Visit: Admitting: Neurosurgery

## 2024-04-29 ENCOUNTER — Ambulatory Visit
Admission: RE | Admit: 2024-04-29 | Discharge: 2024-04-29 | Disposition: A | Discharge status: Home / Self Care | Attending: Neurosurgery | Admitting: Neurosurgery

## 2024-04-29 ENCOUNTER — Ambulatory Visit
Admission: RE | Admit: 2024-04-29 | Discharge: 2024-04-29 | Disposition: A | Discharge status: Home / Self Care | Source: Ambulatory Visit | Attending: Neurosurgery | Admitting: Neurosurgery

## 2024-04-29 ENCOUNTER — Encounter: Payer: Self-pay | Admitting: Neurosurgery

## 2024-04-29 ENCOUNTER — Other Ambulatory Visit: Payer: Self-pay | Admitting: Family Medicine

## 2024-04-29 VITALS — BP 132/76 | Ht 65.0 in | Wt 174.0 lb

## 2024-04-29 DIAGNOSIS — M4712 Other spondylosis with myelopathy, cervical region: Secondary | ICD-10-CM | POA: Diagnosis not present

## 2024-04-29 DIAGNOSIS — M4802 Spinal stenosis, cervical region: Secondary | ICD-10-CM

## 2024-04-29 DIAGNOSIS — M4312 Spondylolisthesis, cervical region: Secondary | ICD-10-CM | POA: Diagnosis not present

## 2024-04-29 DIAGNOSIS — M47812 Spondylosis without myelopathy or radiculopathy, cervical region: Secondary | ICD-10-CM | POA: Diagnosis not present

## 2024-04-29 DIAGNOSIS — M542 Cervicalgia: Secondary | ICD-10-CM | POA: Diagnosis not present

## 2024-04-29 DIAGNOSIS — R2689 Other abnormalities of gait and mobility: Secondary | ICD-10-CM

## 2024-04-29 NOTE — Telephone Encounter (Signed)
 Faxed orders to [facility] home health

## 2024-04-30 ENCOUNTER — Telehealth: Payer: Self-pay | Admitting: Neurology

## 2024-04-30 DIAGNOSIS — G629 Polyneuropathy, unspecified: Secondary | ICD-10-CM | POA: Diagnosis not present

## 2024-04-30 DIAGNOSIS — F32A Depression, unspecified: Secondary | ICD-10-CM | POA: Diagnosis not present

## 2024-04-30 DIAGNOSIS — Z87891 Personal history of nicotine dependence: Secondary | ICD-10-CM | POA: Diagnosis not present

## 2024-04-30 DIAGNOSIS — G8929 Other chronic pain: Secondary | ICD-10-CM | POA: Diagnosis not present

## 2024-04-30 DIAGNOSIS — G309 Alzheimer's disease, unspecified: Secondary | ICD-10-CM | POA: Diagnosis not present

## 2024-04-30 DIAGNOSIS — M545 Low back pain, unspecified: Secondary | ICD-10-CM | POA: Diagnosis not present

## 2024-04-30 DIAGNOSIS — M4712 Other spondylosis with myelopathy, cervical region: Secondary | ICD-10-CM | POA: Insufficient documentation

## 2024-04-30 DIAGNOSIS — F0283 Dementia in other diseases classified elsewhere, unspecified severity, with mood disturbance: Secondary | ICD-10-CM | POA: Diagnosis not present

## 2024-04-30 DIAGNOSIS — F0284 Dementia in other diseases classified elsewhere, unspecified severity, with anxiety: Secondary | ICD-10-CM | POA: Diagnosis not present

## 2024-04-30 DIAGNOSIS — I10 Essential (primary) hypertension: Secondary | ICD-10-CM | POA: Diagnosis not present

## 2024-04-30 NOTE — Telephone Encounter (Signed)
 [facility] requesting verbal order for speech therapy. Frequency: 1xwk for 4 wks

## 2024-04-30 NOTE — Telephone Encounter (Signed)
 Call to [place], [place]. Left verbal orders as requested on secure voicemail

## 2024-05-04 DIAGNOSIS — F0283 Dementia in other diseases classified elsewhere, unspecified severity, with mood disturbance: Secondary | ICD-10-CM | POA: Diagnosis not present

## 2024-05-04 DIAGNOSIS — G629 Polyneuropathy, unspecified: Secondary | ICD-10-CM | POA: Diagnosis not present

## 2024-05-04 DIAGNOSIS — G8929 Other chronic pain: Secondary | ICD-10-CM | POA: Diagnosis not present

## 2024-05-04 DIAGNOSIS — M545 Low back pain, unspecified: Secondary | ICD-10-CM | POA: Diagnosis not present

## 2024-05-04 DIAGNOSIS — I10 Essential (primary) hypertension: Secondary | ICD-10-CM | POA: Diagnosis not present

## 2024-05-04 DIAGNOSIS — Z87891 Personal history of nicotine dependence: Secondary | ICD-10-CM | POA: Diagnosis not present

## 2024-05-04 DIAGNOSIS — G309 Alzheimer's disease, unspecified: Secondary | ICD-10-CM | POA: Diagnosis not present

## 2024-05-04 DIAGNOSIS — F32A Depression, unspecified: Secondary | ICD-10-CM | POA: Diagnosis not present

## 2024-05-04 DIAGNOSIS — F0284 Dementia in other diseases classified elsewhere, unspecified severity, with anxiety: Secondary | ICD-10-CM | POA: Diagnosis not present

## 2024-05-05 ENCOUNTER — Ambulatory Visit: Payer: Self-pay | Admitting: Family Medicine

## 2024-05-05 ENCOUNTER — Encounter: Payer: Self-pay | Admitting: Family Medicine

## 2024-05-05 ENCOUNTER — Ambulatory Visit: Admitting: Family Medicine

## 2024-05-05 VITALS — BP 126/70 | HR 100 | Ht 65.0 in | Wt 175.4 lb

## 2024-05-05 DIAGNOSIS — E782 Mixed hyperlipidemia: Secondary | ICD-10-CM | POA: Diagnosis not present

## 2024-05-05 DIAGNOSIS — R1013 Epigastric pain: Secondary | ICD-10-CM

## 2024-05-05 DIAGNOSIS — N1832 Chronic kidney disease, stage 3b: Secondary | ICD-10-CM

## 2024-05-05 DIAGNOSIS — F1020 Alcohol dependence, uncomplicated: Secondary | ICD-10-CM

## 2024-05-05 DIAGNOSIS — I10 Essential (primary) hypertension: Secondary | ICD-10-CM | POA: Diagnosis not present

## 2024-05-05 DIAGNOSIS — G6289 Other specified polyneuropathies: Secondary | ICD-10-CM | POA: Diagnosis not present

## 2024-05-05 LAB — COMPREHENSIVE METABOLIC PANEL WITH GFR
ALT: 15 U/L (ref 0–35)
AST: 22 U/L (ref 0–37)
Albumin: 4.3 g/dL (ref 3.5–5.2)
Alkaline Phosphatase: 58 U/L (ref 39–117)
BUN: 18 mg/dL (ref 6–23)
CO2: 27 meq/L (ref 19–32)
Calcium: 9.5 mg/dL (ref 8.4–10.5)
Chloride: 102 meq/L (ref 96–112)
Creatinine, Ser: 1.4 mg/dL — ABNORMAL HIGH (ref 0.40–1.20)
GFR: 36.17 mL/min — ABNORMAL LOW (ref >60.00)
Glucose, Bld: 87 mg/dL (ref 70–99)
Potassium: 3.9 meq/L (ref 3.5–5.1)
Sodium: 136 meq/L (ref 135–145)
Total Bilirubin: 0.5 mg/dL (ref 0.2–1.2)
Total Protein: 7.4 g/dL (ref 6.0–8.3)

## 2024-05-05 LAB — MICROALBUMIN / CREATININE URINE RATIO
Creatinine,U: 54.1 mg/dL
Microalb Creat Ratio: 69.9 mg/g — ABNORMAL HIGH (ref 0.0–30.0)
Microalb, Ur: 3.8 mg/dL — ABNORMAL HIGH (ref 0.0–1.9)

## 2024-05-05 LAB — LIPID PANEL
Cholesterol: 158 mg/dL (ref 0–200)
HDL: 74.9 mg/dL (ref >39.00)
LDL Cholesterol: 66 mg/dL (ref 0–99)
NonHDL: 82.65
Total CHOL/HDL Ratio: 2
Triglycerides: 83 mg/dL (ref 0.0–149.0)
VLDL: 16.6 mg/dL (ref 0.0–40.0)

## 2024-05-05 NOTE — Patient Instructions (Addendum)
 A few things to remember from today's visit:  Stage 3b chronic kidney disease (HCC) - Plan: Comprehensive metabolic panel with GFR, Microalbumin / creatinine urine ratio  Hyperlipemia, mixed - Plan: Lipid panel  Benign essential hypertension  Other polyneuropathy  Dyspepsia  Omeprazole  to be weaned off: 1 tab every other day for 4 weeks then every 3rd day for 3 weeks then every 4th day for 2 weeks and stop. No changes in rest.  If you need refills for medications you take chronically, please call your pharmacy. Do not use My Chart to request refills or for acute issues that need immediate attention. If you send a my chart message, it may take a few days to be addressed, specially if I am not in the office.  Please be sure medication list is accurate. If a new problem present, please set up appointment sooner than planned today.

## 2024-05-05 NOTE — Progress Notes (Signed)
 HPI: [name] is a 78 y.o. female with a PMHx significant for chronic pain, peripheral neuropathy, PAD, diastolic dysfunction, alcohol dependency, anxiety, CKD III, HLD, and HTN, who is here today for chronic disease management.  Last seen on 12/06/23 Since her last visit she has established with new neurologist, also seen neurosurgeon and started PT and speech therapy. No new problems. Started on Namenda  10 mg daily for cognitive impairment.. States that she is scheduled for a cervical surgery soon on C 3 & 4. Per pt, the official surgery date has not been set.  Still following with pain management, established with new provider. Currently on Nucynta 250 mg BID. She is considering weaning off medication.  Peripheral neuropathy: She is  on Gabapentin  300 mg BID and OTC Voltaren gel for arthralgias..  Tolerating well with no side effects reported.   Alcohol dependency: Drinking 3 glasses of wine daily.  Hypertension: Currently on Losartan  25 mg daily and Metoprolol  succinate 25 mg daily.  Also taking Lasix  20 mg daily for lower extremity edema.  Has not been checking her BP due to her cuff being broken, however, her PT and speech therapist are regularly checking her BP when they visit her home.  No side effects reported.  Vision: Last eye exam was over 1 year ago.  She reports difficulties with her right eye, stating she tends to close her right eye when reading.   Component     Latest Ref Rng 12/06/2023  Sodium     135 - 145 mEq/L 139   Potassium     3.5 - 5.1 mEq/L 3.8   Chloride     96 - 112 mEq/L 101   CO2     19 - 32 mEq/L 28   Glucose     70 - 99 mg/dL 895 (H)   BUN     6 - 23 mg/dL 15   Creatinine     9.59 - 1.20 mg/dL 9.01   GFR     >39.99 mL/min 55.66 (L)   Calcium      8.4 - 10.5 mg/dL 89.9     CKD III: Negative for gross hematuria, foamy urine, or decreased urine output.  She acknowledges she is not drinking fluids as  recommended.  Dyspepsia: She is inquiring about the need for continuing Omeprazole . She is not having heartburn or acid reflux. She is on Omeprazole  20 mg daily.  Review of Systems  Constitutional:  Negative for activity change, appetite change and fever.  HENT:  Negative for sore throat.   Respiratory:  Negative for cough and wheezing.   Gastrointestinal:  Negative for abdominal pain, nausea and vomiting.  Genitourinary:  Negative for dysuria.  Musculoskeletal:  Positive for arthralgias, back pain and gait problem.  Skin:  Negative for rash.  Neurological:  Negative for syncope and facial asymmetry.  Psychiatric/Behavioral:  Negative for hallucinations.   See other pertinent positives and negatives in HPI.  Current Outpatient Medications on File Prior to Visit  Medication Sig Dispense Refill   alendronate  (FOSAMAX ) 70 MG tablet TAKE 1 TABLET BY MOUTH EVERY WEEK. TAKE WITH A FULL GLASS OF WATER ON AN EMPTY STOMACH. 12 tablet 2   Ascorbic Acid (VITAMIN C) 1000 MG tablet Take 1,000 mg by mouth daily.     aspirin  EC 81 MG tablet Take 81 mg by mouth daily. Swallow whole.     folic acid  (FOLVITE ) 800 MCG tablet Take 800 mcg by mouth daily.     furosemide  (LASIX )  20 MG tablet TAKE 1 TABLET BY MOUTH EVERY DAY 90 tablet 2   gabapentin  (NEURONTIN ) 300 MG capsule TAKE 1 CAPSULE BY MOUTH TWICE A DAY 60 capsule 5   losartan  (COZAAR ) 25 MG tablet TAKE 1 TABLET (25 MG TOTAL) BY MOUTH DAILY. 90 tablet 2   memantine  (NAMENDA ) 10 MG tablet Take 1 tablet (10 mg total) by mouth 2 (two) times daily. 60 tablet 11   metoprolol  succinate (TOPROL -XL) 25 MG 24 hr tablet TAKE 1 TABLET (25 MG TOTAL) BY MOUTH DAILY. 90 tablet 2   omeprazole  (PRILOSEC) 20 MG capsule TAKE 1 CAPSULE BY MOUTH EVERY DAY 90 capsule 1   pyridOXINE  (VITAMIN B-6) 100 MG tablet Take 100 mg by mouth daily.     rosuvastatin  (CRESTOR ) 20 MG tablet Take 1 tablet (20 mg total) by mouth daily. 90 tablet 3   Tapentadol HCl (NUCYNTA ER) 250 MG  TB12 Take 1 tablet by mouth 2 (two) times daily.     thiamine  (VITAMIN B-1) 100 MG tablet Take 100 mg by mouth daily.     triamcinolone  cream (KENALOG ) 0.1 % APPLY TO LEGS TOPICALLY TWICE A DAY AS NEEDED 30 g 2   No current facility-administered medications on file prior to visit.    Past Medical History:  Diagnosis Date   Anxiety    Depression    GERD (gastroesophageal reflux disease)    Hypertension    Peripheral neuropathy    Allergies  Allergen Reactions   Epinephrine Other (See Comments) and Palpitations    Heart Races Uncomfortably  Other Reaction(s): irregular heart rate  epinephrine    Social History   Socioeconomic History   Marital status: Married    Spouse name: Not on file   Number of children: 1   Years of education: Not on file   Highest education level: Associate degree: academic program  Occupational History   Occupation: retired  Tobacco Use   Smoking status: Former    Current packs/day: 0.00    Types: Cigarettes    Quit date: 1984    Years since quitting: 41.5   Smokeless tobacco: Never  Vaping Use   Vaping status: Never Used  Substance and Sexual Activity   Alcohol use: Yes    Alcohol/week: 3.0 standard drinks of alcohol    Types: 3 Glasses of wine per week    Comment: Drinks Wine   Drug use: Yes    Types: Other-see comments    Comment: nucytha   Sexual activity: Not on file  Other Topics Concern   Not on file  Social History Narrative   Right handed   One story home   Drinks occasional caffeine   Social Drivers of Health   Financial Resource Strain: Low Risk  (12/28/2022)   Overall Financial Resource Strain (CARDIA)    Difficulty of Paying Living Expenses: Not hard at all  Food Insecurity: No Food Insecurity (12/28/2022)   Hunger Vital Sign    Worried About Running Out of Food in the Last Year: Never true    Ran Out of Food in the Last Year: Never true  Transportation Needs: No Transportation Needs (12/28/2022)   PRAPARE -  Administrator, Civil Service (Medical): No    Lack of Transportation (Non-Medical): No  Physical Activity: Sufficiently Active (12/28/2022)   Exercise Vital Sign    Days of Exercise per Week: 5 days    Minutes of Exercise per Session: 30 min  Stress: No Stress Concern Present (12/28/2022)  [employer] of Occupational Health - Occupational Stress Questionnaire    Feeling of Stress : Not at all  Social Connections: Socially Integrated (12/28/2022)   Social Connection and Isolation Panel    Frequency of Communication with Friends and Family: More than three times a week    Frequency of Social Gatherings with Friends and Family: More than three times a week    Attends Religious Services: More than 4 times per year    Active Member of [employer] or Organizations: Yes    Attends Banker Meetings: More than 4 times per year    Marital Status: Married    Vitals:   05/05/24 1257  BP: 126/70  Pulse: 100  SpO2: 97%   Body mass index is 29.18 kg/m.  Physical Exam Vitals and nursing note reviewed.  Constitutional:      General: She is not in acute distress.    Appearance: She is well-developed.  HENT:     Head: Normocephalic and atraumatic.     Mouth/Throat:     Mouth: Mucous membranes are moist.     Pharynx: Uvula midline.   Eyes:     Conjunctiva/sclera: Conjunctivae normal.    Cardiovascular:     Rate and Rhythm: Normal rate and regular rhythm.     Heart sounds: Murmur (SEM II/VI RUSB and LUSB) heard.     Comments: Trace pitting edema LE, bilateral. DP pulses palpable. Pulmonary:     Effort: Pulmonary effort is normal. No respiratory distress.     Breath sounds: Normal breath sounds.  Abdominal:     Palpations: Abdomen is soft. There is no mass.     Tenderness: There is no abdominal tenderness.   Skin:    General: Skin is warm.     Findings: No erythema or rash.   Neurological:     Mental Status: She is alert and oriented to person, place, and  time.     Comments: Mildly unstable gait, not assisted today.  Psychiatric:        Mood and Affect: Affect normal. Mood is anxious.    ASSESSMENT AND PLAN: Ms. Rebecca Baird was seen today for chronic disease management.  Lab Results  Component Value Date   NA 136 05/05/2024   CL 102 05/05/2024   K 3.9 05/05/2024   CO2 27 05/05/2024   BUN 18 05/05/2024   CREATININE 1.40 (H) 05/05/2024   GFR 36.17 (L) 05/05/2024   CALCIUM  9.5 05/05/2024   ALBUMIN 4.3 05/05/2024   GLUCOSE 87 05/05/2024   Lab Results  Component Value Date   ALT 15 05/05/2024   AST 22 05/05/2024   ALKPHOS 58 05/05/2024   BILITOT 0.5 05/05/2024   Lab Results  Component Value Date   MICROALBUR 3.8 (H) 05/05/2024   MICROALBUR <0.7 12/06/2023   Stage 3b chronic kidney disease (HCC) Assessment & Plan: Problem has been stable. Creatinine 0.9 and EGFR 55 in 11/2023. Stressed importance of adequate hydration. Continue low-salt diet and avoidance of NSAIDs. BP has been adequately controlled. She is on Losartan  25 mg daily.  Orders: -     Comprehensive metabolic panel with GFR; Future -     Microalbumin / creatinine urine ratio; Future  Hyperlipemia, mixed Assessment & Plan: Continue rosuvastatin  20 mg daily and low-fat diet. Further recommendation will be given according to lipid panel result.  Orders: -     Lipid panel; Future  Benign essential hypertension Assessment & Plan: Problem is well controlled. BP is being monitored by  PT and OT at this time. Continue Metoprolol  succinate 25 mg daily and Losartan  25 mg daily as well as low salt diet to continue. Due for eye exam. F/U in 6 months.  Other polyneuropathy Assessment & Plan: Problem has been stable. Currently on gabapentin  300 mg bid. Continue appropriate skin/footcare. Fall precautions to continue. Following with pain management and neurologist.  Dyspepsia Asymptomatic. Continue GERD precautions. Start weaning off Omeprazole   as instructed on AVS. If she has occasional heartburn, we can try as needed Pepcid 20 mg daily, if frequent, we need to resume PPI daily.  Uncomplicated alcohol dependence (HCC) Assessment & Plan: Encouraged decreasing amount of alcohol and weaning it off.  I spent a total of 41 minutes in both face to face and non face to face activities for this visit on the date of this encounter. During this time history was obtained and documented, examination was performed, prior labs reviewed, and assessment/plan discussed.  Return in about 6 months (around 11/04/2024) for chronic problems.  I, Rebecca Baird, acting as a scribe for Rebecca Broman [place], MD., have documented all relevant documentation on the behalf of Rebecca Rawlinson [place], MD, as directed by   while in the presence of Rebecca Mittelstaedt [place], MD.  I, Rebecca Vivona [place], MD, have reviewed all documentation for this visit. The documentation on 05/05/24 for the exam, diagnosis, procedures, and orders are all accurate and complete.  Rebecca Baird G. [place], MD  [facility]. [place] office

## 2024-05-05 NOTE — Assessment & Plan Note (Signed)
 Problem is well controlled. BP is being monitored by PT and OT at this time. Continue Metoprolol  succinate 25 mg daily and Losartan  25 mg daily as well as low salt diet to continue. Due for eye exam. F/U in 6 months.

## 2024-05-05 NOTE — Assessment & Plan Note (Signed)
 Problem has been stable. Currently on gabapentin  300 mg bid. Continue appropriate skin/footcare. Fall precautions to continue. Following with pain management and neurologist.

## 2024-05-05 NOTE — Assessment & Plan Note (Signed)
 Encouraged decreasing amount of alcohol and weaning it off.

## 2024-05-05 NOTE — Assessment & Plan Note (Signed)
 Problem has been stable. Creatinine 0.9 and EGFR 55 in 11/2023. Stressed importance of adequate hydration. Continue low-salt diet and avoidance of NSAIDs. BP has been adequately controlled. She is on Losartan  25 mg daily.

## 2024-05-05 NOTE — Assessment & Plan Note (Signed)
 Continue rosuvastatin 20 mg daily and low-fat diet. Further recommendation will be given according to lipid panel result.

## 2024-05-06 ENCOUNTER — Ambulatory Visit
Admission: RE | Admit: 2024-05-06 | Discharge: 2024-05-06 | Disposition: A | Discharge status: Home / Self Care | Source: Ambulatory Visit | Attending: Family Medicine | Admitting: Family Medicine

## 2024-05-06 DIAGNOSIS — R928 Other abnormal and inconclusive findings on diagnostic imaging of breast: Secondary | ICD-10-CM

## 2024-05-06 DIAGNOSIS — N6011 Diffuse cystic mastopathy of right breast: Secondary | ICD-10-CM | POA: Diagnosis not present

## 2024-05-07 ENCOUNTER — Ambulatory Visit: Admitting: Family Medicine

## 2024-05-07 ENCOUNTER — Telehealth: Payer: Self-pay

## 2024-05-14 DIAGNOSIS — F0284 Dementia in other diseases classified elsewhere, unspecified severity, with anxiety: Secondary | ICD-10-CM | POA: Diagnosis not present

## 2024-05-14 DIAGNOSIS — M545 Low back pain, unspecified: Secondary | ICD-10-CM | POA: Diagnosis not present

## 2024-05-14 DIAGNOSIS — G8929 Other chronic pain: Secondary | ICD-10-CM | POA: Diagnosis not present

## 2024-05-14 DIAGNOSIS — I10 Essential (primary) hypertension: Secondary | ICD-10-CM | POA: Diagnosis not present

## 2024-05-14 DIAGNOSIS — Z87891 Personal history of nicotine dependence: Secondary | ICD-10-CM | POA: Diagnosis not present

## 2024-05-14 DIAGNOSIS — G629 Polyneuropathy, unspecified: Secondary | ICD-10-CM | POA: Diagnosis not present

## 2024-05-14 DIAGNOSIS — F0283 Dementia in other diseases classified elsewhere, unspecified severity, with mood disturbance: Secondary | ICD-10-CM | POA: Diagnosis not present

## 2024-05-14 DIAGNOSIS — F32A Depression, unspecified: Secondary | ICD-10-CM | POA: Diagnosis not present

## 2024-05-14 DIAGNOSIS — G309 Alzheimer's disease, unspecified: Secondary | ICD-10-CM | POA: Diagnosis not present

## 2024-05-16 ENCOUNTER — Other Ambulatory Visit: Payer: Self-pay | Admitting: Neurology

## 2024-05-17 ENCOUNTER — Ambulatory Visit: Payer: Self-pay | Admitting: Neurosurgery

## 2024-05-20 DIAGNOSIS — F0283 Dementia in other diseases classified elsewhere, unspecified severity, with mood disturbance: Secondary | ICD-10-CM | POA: Diagnosis not present

## 2024-05-20 DIAGNOSIS — G629 Polyneuropathy, unspecified: Secondary | ICD-10-CM | POA: Diagnosis not present

## 2024-05-20 DIAGNOSIS — F0284 Dementia in other diseases classified elsewhere, unspecified severity, with anxiety: Secondary | ICD-10-CM | POA: Diagnosis not present

## 2024-05-20 DIAGNOSIS — Z87891 Personal history of nicotine dependence: Secondary | ICD-10-CM | POA: Diagnosis not present

## 2024-05-20 DIAGNOSIS — F32A Depression, unspecified: Secondary | ICD-10-CM | POA: Diagnosis not present

## 2024-05-20 DIAGNOSIS — G8929 Other chronic pain: Secondary | ICD-10-CM | POA: Diagnosis not present

## 2024-05-20 DIAGNOSIS — I10 Essential (primary) hypertension: Secondary | ICD-10-CM | POA: Diagnosis not present

## 2024-05-20 DIAGNOSIS — M545 Low back pain, unspecified: Secondary | ICD-10-CM | POA: Diagnosis not present

## 2024-05-20 DIAGNOSIS — G309 Alzheimer's disease, unspecified: Secondary | ICD-10-CM | POA: Diagnosis not present

## 2024-05-25 DIAGNOSIS — G8929 Other chronic pain: Secondary | ICD-10-CM | POA: Diagnosis not present

## 2024-05-25 DIAGNOSIS — M5416 Radiculopathy, lumbar region: Secondary | ICD-10-CM | POA: Diagnosis not present

## 2024-05-26 ENCOUNTER — Telehealth: Payer: Self-pay

## 2024-05-26 NOTE — Telephone Encounter (Signed)
 Message from Dr Claudene: are we able to move up her dexa? awaiting results for planning

## 2024-05-30 ENCOUNTER — Other Ambulatory Visit: Payer: Self-pay | Admitting: Family Medicine

## 2024-05-30 DIAGNOSIS — G6289 Other specified polyneuropathies: Secondary | ICD-10-CM

## 2024-06-02 NOTE — Progress Notes (Signed)
   06/02/2024  Patient ID: Rebecca Baird, female   DOB: 1946/03/10, 78 y.o.   MRN: [id number]  Pharmacy Quality Measure Review  This patient is appearing on a report for being at risk of failing the adherence measure for cholesterol (statin) medications this calendar year.   Medication: Rosuvastatin  Last fill date: 04/12/24 for 90 day supply  Insurance report was not up to date. No action needed at this time.   Jon VEAR Lindau, PharmD Clinical Pharmacist [phone]

## 2024-06-12 NOTE — Telephone Encounter (Signed)
 She read previous mychart message, but did not respond. Sent a follow up [employer].

## 2024-07-05 ENCOUNTER — Other Ambulatory Visit: Payer: Self-pay | Admitting: Family Medicine

## 2024-07-05 DIAGNOSIS — M816 Localized osteoporosis [Lequesne]: Secondary | ICD-10-CM

## 2024-07-20 DIAGNOSIS — M5416 Radiculopathy, lumbar region: Secondary | ICD-10-CM | POA: Diagnosis not present

## 2024-07-20 DIAGNOSIS — G8929 Other chronic pain: Secondary | ICD-10-CM | POA: Diagnosis not present

## 2024-07-28 ENCOUNTER — Other Ambulatory Visit: Payer: Medicare PPO

## 2024-08-05 ENCOUNTER — Other Ambulatory Visit: Payer: Medicare PPO

## 2024-08-11 NOTE — Progress Notes (Signed)
   08/11/2024  Patient ID: Rebecca Baird, female   DOB: 1946/08/11, 78 y.o.   MRN: [id number]  Pharmacy Quality Measure Review  This patient is appearing on a report for being at risk of failing the adherence measure for cholesterol (statin) medications this calendar year.   Medication: Rosuvastatin  20mg  Last fill date: 06/06/24 for 90 day supply  Will collaborate with provider to facilitate refill needs. Current rx has expired.  Jon VEAR Lindau, PharmD Clinical Pharmacist [phone]

## 2024-08-12 ENCOUNTER — Other Ambulatory Visit: Payer: Self-pay

## 2024-08-12 DIAGNOSIS — E782 Mixed hyperlipidemia: Secondary | ICD-10-CM

## 2024-08-12 MED ORDER — ROSUVASTATIN CALCIUM 20 MG PO TABS: 20.0000 mg | ORAL_TABLET | Freq: Every day | ORAL | 3 refills | Status: AC

## 2024-08-20 ENCOUNTER — Other Ambulatory Visit (HOSPITAL_BASED_OUTPATIENT_CLINIC_OR_DEPARTMENT_OTHER)

## 2024-08-21 ENCOUNTER — Ambulatory Visit: Payer: Self-pay

## 2024-08-21 NOTE — Telephone Encounter (Signed)
 FYI Only or Action Required?: FYI only for provider.  Patient was last seen in primary care on 05/05/2024 by [place], Betty G, MD.  Called Nurse Triage reporting Neurologic Problem.  Symptoms began this morning upon waking, last normal was before bed last night.  Interventions attempted: Rest, hydration, or home remedies.  Symptoms are: gradually improving.  Triage Disposition: Call EMS 911 Now  Patient/caregiver understands and will follow disposition?:  Reason for Disposition  Difficult to awaken or acting confused (e.g., disoriented, slurred speech)  Answer Assessment - Initial Assessment Questions Pt awoke one hour ago and was confused, not oriented to time and was ataxic, needing to hold walls to ambulate. States I'm not thinking well.   Pt able to recite name and DOB. Was not able to correctly recall her mailing address, states she has never had a good memory for that. Does know the month, president and her state.   States she has hx of multiple TIAs. Denies hx of seizures or hx of DM. When asked about dysuria, pt states she does feel like her stream is narrower and she has to push slightly.   911 was contacted by this RN on behalf of the patient, confirming home address with husband prior to contacting them to confirm correct. Husband states she has chronic memory issues and mentions that she often forgets to take her medications.   After EMS was dispatched, this RN returned to pt and advised husband to unlock the door, secure any pets and gather her medications. Husband to call 911 back with any changes or new symptoms. Requested they call once discharged to schedule hospital f/u with PCP office.  1. SYMPTOM: What is the main symptom you are concerned about? (e.g., weakness, numbness)     Currently weak, possibly still ataxic (sitting, difficult to assess).   2. ONSET: When did this start? (e.g., minutes, hours, days; while sleeping)     One hour  3. LAST NORMAL:  When was the last time you (the patient) were normal (no symptoms)?     Last night, awoke with symptoms  4. PATTERN Does this come and go, or has it been constant since it started?  Is it present now?     Symptoms resolved except weakness 5. CARDIAC SYMPTOMS: Have you had any of the following symptoms: chest pain, difficulty breathing, palpitations?     Denies  6. NEUROLOGIC SYMPTOMS: Have you had any of the following symptoms: headache, dizziness, vision loss, double vision, changes in speech, unsteady on your feet?     Confusion worse than baseline, ataxia  7. OTHER SYMPTOMS: Do you have any other symptoms?     Denies  Protocols used: Neurologic [facility] Copied from CRM [id number]. Topic: Clinical - Red Word Triage >> Aug 21, 2024 12:07 PM Paige D wrote: Red Word that prompted transfer to Nurse Triage: Pt woke up horrible sense a balance can't walk with cane or walker she can't remember her phone number and her speech she thinks she had a stroke and can barley talk.

## 2024-08-21 NOTE — Telephone Encounter (Signed)
 Patient states that EMS came out- she had improved and did not go with them- patient reports she has improved to the point that she is walking  unassisted in her bedroom.Patient reports she still has some confusion- but it has cleared up immensely. Patient vital signs are all normal- per EMS. Patient is wanting to make an appointment with her provider. Advised patient it is still wise to be checked and ED is the best place for that. Patient declines to go to ED- she feels it is a waste of time and insurance money. Reviewed reasons again, explained that she must be seen if her symptoms return and the danger of not getting them checked. Patient is requesting an appointment with her provider and it has been scheduled per her request- will send alert to her provider as EMS requested that she follow up.

## 2024-08-24 ENCOUNTER — Ambulatory Visit: Admitting: Family Medicine

## 2024-08-24 ENCOUNTER — Encounter: Payer: Self-pay | Admitting: Family Medicine

## 2024-08-24 VITALS — BP 138/80 | HR 93 | Temp 97.9°F | Resp 16 | Ht 65.0 in | Wt 166.6 lb

## 2024-08-24 DIAGNOSIS — G300 Alzheimer's disease with early onset: Secondary | ICD-10-CM | POA: Diagnosis not present

## 2024-08-24 DIAGNOSIS — G309 Alzheimer's disease, unspecified: Secondary | ICD-10-CM | POA: Insufficient documentation

## 2024-08-24 DIAGNOSIS — G894 Chronic pain syndrome: Secondary | ICD-10-CM | POA: Diagnosis not present

## 2024-08-24 DIAGNOSIS — I10 Essential (primary) hypertension: Secondary | ICD-10-CM

## 2024-08-24 DIAGNOSIS — R410 Disorientation, unspecified: Secondary | ICD-10-CM | POA: Diagnosis not present

## 2024-08-24 DIAGNOSIS — F0280 Dementia in other diseases classified elsewhere without behavioral disturbance: Secondary | ICD-10-CM | POA: Diagnosis not present

## 2024-08-24 DIAGNOSIS — Z23 Encounter for immunization: Secondary | ICD-10-CM | POA: Diagnosis not present

## 2024-08-24 DIAGNOSIS — G6289 Other specified polyneuropathies: Secondary | ICD-10-CM

## 2024-08-24 DIAGNOSIS — N1832 Chronic kidney disease, stage 3b: Secondary | ICD-10-CM

## 2024-08-24 DIAGNOSIS — F1020 Alcohol dependence, uncomplicated: Secondary | ICD-10-CM

## 2024-08-24 LAB — COMPREHENSIVE METABOLIC PANEL WITH GFR
ALT: 13 U/L (ref 0–35)
AST: 21 U/L (ref 0–37)
Albumin: 4.2 g/dL (ref 3.5–5.2)
Alkaline Phosphatase: 63 U/L (ref 39–117)
BUN: 12 mg/dL (ref 6–23)
CO2: 29 meq/L (ref 19–32)
Calcium: 9.9 mg/dL (ref 8.4–10.5)
Chloride: 102 meq/L (ref 96–112)
Creatinine, Ser: 1.08 mg/dL (ref 0.40–1.20)
GFR: 49.29 mL/min — ABNORMAL LOW (ref >60.00)
Glucose, Bld: 133 mg/dL — ABNORMAL HIGH (ref 70–99)
Potassium: 3.6 meq/L (ref 3.5–5.1)
Sodium: 140 meq/L (ref 135–145)
Total Bilirubin: 0.6 mg/dL (ref 0.2–1.2)
Total Protein: 7.2 g/dL (ref 6.0–8.3)

## 2024-08-24 LAB — CBC WITH DIFFERENTIAL/PLATELET
Basophils Absolute: 0 K/uL (ref 0.0–0.1)
Basophils Relative: 0.5 % (ref 0.0–3.0)
Eosinophils Absolute: 0 K/uL (ref 0.0–0.7)
Eosinophils Relative: 0.6 % (ref 0.0–5.0)
HCT: 41.4 % (ref 36.0–46.0)
Hemoglobin: 13.7 g/dL (ref 12.0–15.0)
Lymphocytes Relative: 13.6 % (ref 12.0–46.0)
Lymphs Abs: 1.2 K/uL (ref 0.7–4.0)
MCHC: 33.1 g/dL (ref 30.0–36.0)
MCV: 97.2 fl (ref 78.0–100.0)
Monocytes Absolute: 0.5 K/uL (ref 0.1–1.0)
Monocytes Relative: 5.8 % (ref 3.0–12.0)
Neutro Abs: 6.7 K/uL (ref 1.4–7.7)
Neutrophils Relative %: 79.5 % — ABNORMAL HIGH (ref 43.0–77.0)
Platelets: 210 K/uL (ref 150.0–400.0)
RBC: 4.26 Mil/uL (ref 3.87–5.11)
RDW: 13.8 % (ref 11.5–15.5)
WBC: 8.5 K/uL (ref 4.0–10.5)

## 2024-08-24 LAB — VITAMIN D 25 HYDROXY (VIT D DEFICIENCY, FRACTURES): VITD: 91.31 ng/mL (ref 30.00–100.00)

## 2024-08-24 NOTE — Patient Instructions (Addendum)
 A few things to remember from today's visit:  Need for immunization against influenza - Plan: Flu vaccine HIGH DOSE PF(Fluzone Trivalent)  Chronic pain disorder - Plan: Ambulatory referral to Pain Clinic  Disorientation - Plan: Comprehensive metabolic panel with GFR, Urinalysis with Culture Reflex, CBC with Differential/Platelet  Stage 3b chronic kidney disease (HCC) - Plan: Comprehensive metabolic panel with GFR, Microalbumin / creatinine urine ratio, VITAMIN D  25 Hydroxy (Vit-D Deficiency, Fractures)  No changes today. Continue monitoring blood pressure at home. Keep appt with neurologist next months. Appt with pain management will be arranged.  If you need refills for medications you take chronically, please call your pharmacy. Do not use My Chart to request refills or for acute issues that need immediate attention. If you send a my chart message, it may take a few days to be addressed, specially if I am not in the office.  Please be sure medication list is accurate. If a new problem present, please set up appointment sooner than planned today.

## 2024-08-24 NOTE — Progress Notes (Signed)
 "  ACUTE VISIT Chief Complaint  Patient presents with   Medical Management of Chronic Issues   Discussed the use of AI scribe software for clinical note transcription with the patient, who gave verbal consent to proceed.  History of Present Illness Rebecca Baird is a 78 year old female with past medical history significant for chronic pain, peripheral neuropathy, PAD, diastolic dysfunction, alcohol dependency, anxiety, CKD 3, hyperlipidemia, and hypertension here today with her husband to follow on transient episode of confusion. Per triage note on 08/21/2024: She was evaluated by EMS at home because mental status changes.  She experienced an episode of confusion and worsened gait approximately an hour after waking up. 911 was contacted by triage nurse. Vital signs were normal per EMS.  Advised to go to the ED but she declined.  She and her husband think she is back to her baseline. She has had issues with memory for long time, gradually getting worse.  She was evaluated last on 05/05/24 for follow up. Since then she has followed with pain management and neurologist. Dx'ed with Alzheimer's disease.  Currently taking Namenda  10 mg twice daily. According to her husband, she experiences short-term memory issues, such as forgetting recent events or repeating questions. Her husband notes that she sometimes forgets to take her medication, resulting in increased fidgetiness and a change in her behavior.Her husband assists with her medication management to ensure adherence.  She consumes alcohol regularly, per husband report, averaging one and a half liters per day, typically in the evening. She has been consistent with taking thiamine  100 mg daily and pyridoxine  100 mg daily.  She takes several medications including Fosamax  once weekly for osteoporosis. Takes daily Aspirin  81 mg daily for PAD, furosemide  20 mg daily for edema,  Peripheral neuropathy on gabapentin  300 mg twice  daily.  Hypertension: Currently on losartan  25 mg and metoprolol  succinate 25 mg. Negative for unusual headache, visual changes, CP, dyspnea, palpitations, or focal neurologic deficit. CKD 3: She has not noted gross hematuria, foamy urine, or decreased urine output.  Hyperlipidemia on rosuvastatin  20 mg daily. Lab Results  Component Value Date   CHOL 158 05/05/2024   HDL 74.90 05/05/2024   LDLCALC 66 05/05/2024   LDLDIRECT 113.0 06/05/2023   TRIG 83.0 05/05/2024   CHOLHDL 2 05/05/2024   Unstable gait, she uses a cane for transfer.She experienced a fall on Sunday while reaching for a plastic bag under the sink.No serious injury.  Lab Results  Component Value Date   NA 136 05/05/2024   CL 102 05/05/2024   K 3.9 05/05/2024   CO2 27 05/05/2024   BUN 18 05/05/2024   CREATININE 1.40 (H) 05/05/2024   GFR 36.17 (L) 05/05/2024   CALCIUM  9.5 05/05/2024   ALBUMIN 4.3 05/05/2024   GLUCOSE 87 05/05/2024   Lab Results  Component Value Date   MICROALBUR 3.8 (H) 05/05/2024   MICROALBUR <0.2 05/25/2022   Lab Results  Component Value Date   WBC 7.9 07/29/2023   HGB 12.8 07/29/2023   HCT 37.5 07/29/2023   MCV 96.9 07/29/2023   PLT 214 07/29/2023   Lab Results  Component Value Date   MICROALBUR 3.8 (H) 05/05/2024   MICROALBUR <0.2 05/25/2022   Chronic pain: She is currently following with pain management but would like a provider closer to home. Currently she is on Nucynta ER 250 mg twice daily.  Review of Systems  Constitutional:  Negative for activity change, appetite change, chills and fever.  HENT:  Negative  for mouth sores and sore throat.   Respiratory:  Negative for cough and wheezing.   Gastrointestinal:  Negative for abdominal pain, nausea and vomiting.  Endocrine: Negative for cold intolerance and heat intolerance.  Genitourinary:  Negative for dysuria.  Musculoskeletal:  Positive for arthralgias and gait problem.  Skin:  Negative for rash.  Neurological:  Negative  for syncope and facial asymmetry.  Psychiatric/Behavioral:  Negative for hallucinations.   See other pertinent positives and negatives in HPI.  Current Outpatient Medications on File Prior to Visit  Medication Sig Dispense Refill   alendronate  (FOSAMAX ) 70 MG tablet TAKE 1 TABLET BY MOUTH EVERY WEEK. TAKE WITH A FULL GLASS OF WATER ON AN EMPTY STOMACH. 12 tablet 2   Ascorbic Acid (VITAMIN C) 1000 MG tablet Take 1,000 mg by mouth daily.     aspirin  EC 81 MG tablet Take 81 mg by mouth daily. Swallow whole.     folic acid  (FOLVITE ) 800 MCG tablet Take 800 mcg by mouth daily.     furosemide  (LASIX ) 20 MG tablet TAKE 1 TABLET BY MOUTH EVERY DAY 90 tablet 2   gabapentin  (NEURONTIN ) 300 MG capsule TAKE 1 CAPSULE BY MOUTH TWICE A DAY 60 capsule 5   losartan  (COZAAR ) 25 MG tablet TAKE 1 TABLET (25 MG TOTAL) BY MOUTH DAILY. 90 tablet 2   memantine  (NAMENDA ) 10 MG tablet TAKE 1 TABLET BY MOUTH TWICE A DAY 180 tablet 4   metoprolol  succinate (TOPROL -XL) 25 MG 24 hr tablet TAKE 1 TABLET (25 MG TOTAL) BY MOUTH DAILY. 90 tablet 2   omeprazole  (PRILOSEC) 20 MG capsule TAKE 1 CAPSULE BY MOUTH EVERY DAY 90 capsule 1   pyridOXINE  (VITAMIN B-6) 100 MG tablet Take 100 mg by mouth daily.     rosuvastatin  (CRESTOR ) 20 MG tablet Take 1 tablet (20 mg total) by mouth daily. 90 tablet 3   Tapentadol HCl (NUCYNTA ER) 250 MG TB12 Take 1 tablet by mouth 2 (two) times daily.     thiamine  (VITAMIN B-1) 100 MG tablet Take 100 mg by mouth daily.     triamcinolone  cream (KENALOG ) 0.1 % APPLY TO LEGS TOPICALLY TWICE A DAY AS NEEDED 30 g 2   No current facility-administered medications on file prior to visit.   Past Medical History:  Diagnosis Date   Anxiety    Depression    GERD (gastroesophageal reflux disease)    Hypertension    Peripheral neuropathy    Allergies  Allergen Reactions   Epinephrine Other (See Comments) and Palpitations    Heart Races Uncomfortably  Other Reaction(s): irregular heart  rate  epinephrine   Social History   Socioeconomic History   Marital status: Married    Spouse name: Not on file   Number of children: 1   Years of education: Not on file   Highest education level: Associate degree: academic program  Occupational History   Occupation: retired  Tobacco Use   Smoking status: Former    Current packs/day: 0.00    Types: Cigarettes    Quit date: 1984    Years since quitting: 41.8   Smokeless tobacco: Never  Vaping Use   Vaping status: Never Used  Substance and Sexual Activity   Alcohol use: Yes    Alcohol/week: 3.0 standard drinks of alcohol    Types: 3 Glasses of wine per week    Comment: Drinks Wine   Drug use: Yes    Types: Other-see comments    Comment: nucytha   Sexual activity: Not  on file  Other Topics Concern   Not on file  Social History Narrative   Right handed   One story home   Drinks occasional caffeine   Social Drivers of Health   Financial Resource Strain: Low Risk  (12/28/2022)   Overall Financial Resource Strain (CARDIA)    Difficulty of Paying Living Expenses: Not hard at all  Food Insecurity: No Food Insecurity (12/28/2022)   Hunger Vital Sign    Worried About Running Out of Food in the Last Year: Never true    Ran Out of Food in the Last Year: Never true  Transportation Needs: No Transportation Needs (12/28/2022)   PRAPARE - Administrator, Civil Service (Medical): No    Lack of Transportation (Non-Medical): No  Physical Activity: Sufficiently Active (12/28/2022)   Exercise Vital Sign    Days of Exercise per Week: 5 days    Minutes of Exercise per Session: 30 min  Stress: No Stress Concern Present (12/28/2022)   [employer] of Occupational Health - Occupational Stress Questionnaire    Feeling of Stress : Not at all  Social Connections: Socially Integrated (12/28/2022)   Social Connection and Isolation Panel    Frequency of Communication with Friends and Family: More than three times a week     Frequency of Social Gatherings with Friends and Family: More than three times a week    Attends Religious Services: More than 4 times per year    Active Member of Clubs or Organizations: Yes    Attends Banker Meetings: More than 4 times per year    Marital Status: Married   Today's Vitals   08/24/24 1057 08/24/24 1144  BP: (!) 144/80 138/80  Pulse: 93   Resp: 16   Temp: 97.9 F (36.6 C)   SpO2: 94%   Weight: 166 lb 9.6 oz (75.6 kg)   Height: 5' 5 (1.651 m)    Body mass index is 27.72 kg/m.  Physical Exam Vitals and nursing note reviewed.  Constitutional:      General: She is not in acute distress.    Appearance: She is well-developed.  HENT:     Head: Normocephalic and atraumatic.     Mouth/Throat:     Mouth: Mucous membranes are moist.     Pharynx: Uvula midline.  Eyes:     Conjunctiva/sclera: Conjunctivae normal.  Cardiovascular:     Rate and Rhythm: Normal rate and regular rhythm.     Heart sounds: Murmur (SEM II/VI RUSB and LUSB) heard.     Comments: PT pulses palpable. Pulmonary:     Effort: Pulmonary effort is normal. No respiratory distress.     Breath sounds: Normal breath sounds.  Abdominal:     Palpations: Abdomen is soft. There is no mass.     Tenderness: There is no abdominal tenderness.  Musculoskeletal:     Right lower leg: No edema.     Left lower leg: No edema.  Skin:    General: Skin is warm.     Findings: No erythema or rash.  Neurological:     General: No focal deficit present.     Mental Status: She is alert. Mental status is at baseline.     Comments: Mildly unstable gait,assisted with a cane. Remebers month. She does not recall year,date, or day of the week.  Psychiatric:        Mood and Affect: Mood and affect normal.    ASSESSMENT AND PLAN:  Ms. Mckneely was seen  today for transient episode of MS changes.  Orders Placed This Encounter  Procedures   Flu vaccine HIGH DOSE PF(Fluzone Trivalent)   Comprehensive metabolic  panel with GFR   Urinalysis with Culture Reflex   CBC with Differential/Platelet   Microalbumin / creatinine urine ratio   VITAMIN D  25 Hydroxy (Vit-D Deficiency, Fractures)   REFLEXIVE URINE CULTURE   Ambulatory referral to Pain Clinic   Lab Results  Component Value Date   VD25OH 91.31 08/24/2024   Lab Results  Component Value Date   MICROALBUR 3.8 (H) 05/05/2024   MICROALBUR <0.2 05/25/2022   Lab Results  Component Value Date   NA 140 08/24/2024   CL 102 08/24/2024   K 3.6 08/24/2024   CO2 29 08/24/2024   BUN 12 08/24/2024   CREATININE 1.08 08/24/2024   GFR 49.29 (L) 08/24/2024   CALCIUM  9.9 08/24/2024   ALBUMIN 4.2 08/24/2024   GLUCOSE 133 (H) 08/24/2024   Lab Results  Component Value Date   ALT 13 08/24/2024   AST 21 08/24/2024   ALKPHOS 63 08/24/2024   BILITOT 0.6 08/24/2024   Lab Results  Component Value Date   WBC 8.5 08/24/2024   HGB 13.7 08/24/2024   HCT 41.4 08/24/2024   MCV 97.2 08/24/2024   PLT 210.0 08/24/2024   Stage 3b chronic kidney disease (HCC) Assessment & Plan: Last creatinine 1.4 and eGFR 36. Stressed the importance of adequate hydration and BP control. Continue low-salt diet and avoidance of NSAIDs.  Orders: -     Comprehensive metabolic panel with GFR; Future -     Microalbumin / creatinine urine ratio; Future -     VITAMIN D  25 Hydroxy (Vit-D Deficiency, Fractures); Future  Chronic pain disorder Assessment & Plan: Currently on Nucynta ER 250 mg twice daily and gabapentin  300 mg twice daily. She would like a referral to a new provider here in town, referral placed.  Orders: -     Ambulatory referral to Pain Clinic  Disorientation On 08/21/24, EMS evaluation at home with normal VS, still recommended going to the ED but she declined.  We discussed possible etiologies.  She and her husband report that she is back to baseline. Clearly instructed about warning signs. Further recommendation will be given according to lab  results.  -     Comprehensive metabolic panel with GFR; Future -     Urinalysis w microscopic + reflex cultur -     CBC with Differential/Platelet; Future  Uncomplicated alcohol dependence (HCC) Assessment & Plan: Encouraged to decrease alcohol intake to 1-2 glasses daily (from 1.5 L) and work on weaning it off. She is not interested in attending AA meetings.  Benign essential hypertension Assessment & Plan: BP initially mildly elevated, improved after a few minutes. For now continue losartan  25 mg daily and metoprolol  succinate 25 mg daily as well as low-salt diet. Recommend monitoring BP at home.  Other polyneuropathy Assessment & Plan: Stable. Continue gabapentin  300 mg twice daily. Currently she is following with neurologist.  Early onset Alzheimer's dementia without behavioral disturbance, psychotic disturbance, mood disturbance, or anxiety, unspecified dementia severity (HCC) Assessment & Plan: Following with neurologist. Her husband is now managing her medications. Currently she is on Namenda  10 mg twice daily.  Need for immunization against influenza -     Flu vaccine HIGH DOSE PF(Fluzone Trivalent)  Other orders -     REFLEXIVE URINE CULTURE  I personally spent a total of 48 minutes in the care of the patient today including  preparing to see the patient, getting/reviewing separately obtained history, performing a medically appropriate exam/evaluation, counseling and educating, placing orders, and documenting clinical information in the EHR.  Return in about 6 months (around 02/22/2025) for chronic problems.  Loyce Flaming G. Dniya Neuhaus, MD  [facility]. [place] [facility].  "

## 2024-08-25 ENCOUNTER — Ambulatory Visit: Payer: Self-pay | Admitting: Family Medicine

## 2024-08-25 ENCOUNTER — Ambulatory Visit: Admitting: Family Medicine

## 2024-08-25 LAB — URINALYSIS W MICROSCOPIC + REFLEX CULTURE
Bilirubin Urine: NEGATIVE
Glucose, UA: NEGATIVE
Hgb urine dipstick: NEGATIVE
Ketones, ur: NEGATIVE
Leukocyte Esterase: NEGATIVE
Nitrites, Initial: NEGATIVE
Specific Gravity, Urine: 1.016 (ref 1.001–1.035)
pH: 5.5 (ref 5.0–8.0)

## 2024-08-25 LAB — NO CULTURE INDICATED

## 2024-08-25 NOTE — Assessment & Plan Note (Signed)
 Encouraged to decrease alcohol intake to 1-2 glasses daily (from 1.5 L) and work on weaning it off. She is not interested in attending AA meetings.

## 2024-08-25 NOTE — Assessment & Plan Note (Signed)
 Stable. Continue gabapentin  300 mg twice daily. Currently she is following with neurologist.

## 2024-08-25 NOTE — Assessment & Plan Note (Signed)
 BP initially mildly elevated, improved after a few minutes. For now continue losartan  25 mg daily and metoprolol  succinate 25 mg daily as well as low-salt diet. Recommend monitoring BP at home.

## 2024-08-25 NOTE — Progress Notes (Deleted)
 PATIENT CHECK-IN and HEALTH RISK ASSESSMENT QUESTIONNAIRE:  -completed by phone/video for upcoming Medicare Preventive Visit  -PLEASE SELECT NOT IN PERSON for the method of visit.   FIRST check to see if the patient completed the online questionnaire - if so, this can be found under the rooming tab, then go to the questionnaires tab. Some of the questions are the same and you can use the answers to complete all of the bold questions below before calling the patient. Question #s below: 6 (fall risk screening), under habits # 1, 2, 3, 8 and 9,  under everyday activities #2, 3 and 8.   Pre-Visit Check-in: 1)Vitals (height, wt, BP, etc) - record in vitals section for visit on day of visit Request home vitals (wt, BP, etc.) and enter into vitals, THEN update Vital Signs SmartPhrase below at the top of the HPI. See below.  2)Review and Update Medications, Allergies PMH, Surgeries, Social history in Epic 3)Hospitalizations in the last year with date/reason? ***  4)Review and Update Care Team (patient's specialists) in Epic 5) Complete PHQ9 in Epic  6) Complete Fall Screening in Epic 7)Review all Health Maintenance Due and order if not done.  Medicare Wellness Patient Questionnaire:  Answer theses question about your habits: How often do you have a drink containing alcohol?*** How many drinks containing alcohol do you have on a typical day when you are drinking?*** How often do you have six or more drinks on one occasion?*** Have you ever smoked?*** Quit date if applicable? ***  How many packs a day do/did you smoke? *** Do you use smokeless tobacco?*** Do you use an illicit drugs?*** On average, how many days per week do you engage in moderate to strenuous exercise (like a brisk walk)?*** On average, how many minutes do you engage in exercise at this level?*** Are you sexually active? ***Number of partners?*** Typical breakfast**** Typical lunch*** Typical dinner*** Typical  snacks:****  Beverages: ***  Answer theses question about your everyday activities: Can you perform most household chores?*** Are you deaf or have significant trouble hearing?*** Do you feel that you have a problem with memory?*** Do you feel safe at home?*** Last dentist visit?*** 8. Do you have any difficulty performing your everyday activities?*** Are you having any difficulty walking, taking medications on your own, and or difficulty managing daily home needs?*** Do you have difficulty walking or climbing stairs?*** Do you have difficulty dressing or bathing?*** Do you have difficulty doing errands alone such as visiting a doctor's office or shopping?*** Do you currently have any difficulty preparing food and eating?*** Do you currently have any difficulty using the toilet?*** Do you have any difficulty managing your finances?*** Do you have any difficulties with housekeeping of managing your housekeeping?***   Do you have Advanced Directives in place (Living Will, Healthcare Power or Attorney)? ***   Last eye Exam and location?***   Do you currently use prescribed or non-prescribed narcotic or opioid pain medications?***  Do you have a history or close family history of breast, ovarian, tubal or peritoneal cancer or a family member with BRCA (breast cancer susceptibility 1 and 2) gene mutations?  ***Request home vitals (wt, BP, etc.) and enter into vitals, THEN update Vital Signs SmartPhrase below at the top of the HPI. See below.   Nurse/Assistant Credentials/time stamp:    ----------------------------------------------------------------------------------------------------------------------------------------------------------------------------------------------------------------------  Because this visit was a virtual/telehealth visit, some criteria may be missing or patient reported. Any vitals not documented were not able to be obtained and vitals that  have been  documented are patient reported.    MEDICARE ANNUAL PREVENTIVE VISIT WITH PROVIDER: (Welcome to Medicare, initial annual wellness or annual wellness exam)  Virtual Visit via Video***Phone Note  I connected with Rebecca Baird on 08/25/24 by phone *** a video enabled telemedicine application and verified that I am speaking with the correct person using two identifiers.  Location patient: home Location provider:work or home office Persons participating in the virtual visit: patient, provider  Concerns and/or follow up today:   See HM section in Epic for other details of completed HM.    ROS: negative for report of fevers, unintentional weight loss, vision changes, vision loss, hearing loss or change, chest pain, sob, hemoptysis, melena, hematochezia, hematuria, falls, bleeding or bruising, thoughts of suicide or self harm, memory loss  Patient-completed extensive health risk assessment - reviewed and discussed with the patient: See Health Risk Assessment completed with patient prior to the visit either above or in recent phone note. This was reviewed in detailed with the patient today and appropriate recommendations, orders and referrals were placed as needed per Summary below and patient instructions.   Review of Medical History: -PMH, PSH, Family History and current specialty and care providers reviewed and updated and listed below   Patient Care Team: [place], Betty G, MD as PCP - General (Family Medicine) Tobie Tonita POUR, DO as Consulting Physician (Neurology)   Past Medical History:  Diagnosis Date   Anxiety    Depression    GERD (gastroesophageal reflux disease)    Hypertension    Peripheral neuropathy     Past Surgical History:  Procedure Laterality Date   broken ankle     cataract surgery     NOSE SURGERY      Social History   Socioeconomic History   Marital status: Married    Spouse name: Not on file   Number of children: 1   Years of education: Not  on file   Highest education level: Associate degree: academic program  Occupational History   Occupation: retired  Tobacco Use   Smoking status: Former    Current packs/day: 0.00    Types: Cigarettes    Quit date: 1984    Years since quitting: 41.8   Smokeless tobacco: Never  Vaping Use   Vaping status: Never Used  Substance and Sexual Activity   Alcohol use: Yes    Alcohol/week: 3.0 standard drinks of alcohol    Types: 3 Glasses of wine per week    Comment: Drinks Wine   Drug use: Yes    Types: Other-see comments    Comment: nucytha   Sexual activity: Not on file  Other Topics Concern   Not on file  Social History Narrative   Right handed   One story home   Drinks occasional caffeine   Social Drivers of Health   Financial Resource Strain: Low Risk  (12/28/2022)   Overall Financial Resource Strain (CARDIA)    Difficulty of Paying Living Expenses: Not hard at all  Food Insecurity: No Food Insecurity (12/28/2022)   Hunger Vital Sign    Worried About Running Out of Food in the Last Year: Never true    Ran Out of Food in the Last Year: Never true  Transportation Needs: No Transportation Needs (12/28/2022)   PRAPARE - Administrator, Civil Service (Medical): No    Lack of Transportation (Non-Medical): No  Physical Activity: Sufficiently Active (12/28/2022)   Exercise Vital Sign    Days of  Exercise per Week: 5 days    Minutes of Exercise per Session: 30 min  Stress: No Stress Concern Present (12/28/2022)   [employer] of Occupational Health - Occupational Stress Questionnaire    Feeling of Stress : Not at all  Social Connections: Socially Integrated (12/28/2022)   Social Connection and Isolation Panel    Frequency of Communication with Friends and Family: More than three times a week    Frequency of Social Gatherings with Friends and Family: More than three times a week    Attends Religious Services: More than 4 times per year    Active Member of [employer] or  Organizations: Yes    Attends Engineer, structural: More than 4 times per year    Marital Status: Married  Catering manager Violence: Not At Risk (12/28/2022)   Humiliation, Afraid, Rape, and Kick questionnaire    Fear of Current or Ex-Partner: No    Emotionally Abused: No    Physically Abused: No    Sexually Abused: No    Family History  Problem Relation Age of Onset   Breast cancer Mother    Breast cancer Sister 39   Breast cancer Cousin     Current Outpatient Medications on File Prior to Visit  Medication Sig Dispense Refill   alendronate  (FOSAMAX ) 70 MG tablet TAKE 1 TABLET BY MOUTH EVERY WEEK. TAKE WITH A FULL GLASS OF WATER ON AN EMPTY STOMACH. 12 tablet 2   Ascorbic Acid (VITAMIN C) 1000 MG tablet Take 1,000 mg by mouth daily.     aspirin  EC 81 MG tablet Take 81 mg by mouth daily. Swallow whole.     folic acid  (FOLVITE ) 800 MCG tablet Take 800 mcg by mouth daily.     furosemide  (LASIX ) 20 MG tablet TAKE 1 TABLET BY MOUTH EVERY DAY 90 tablet 2   gabapentin  (NEURONTIN ) 300 MG capsule TAKE 1 CAPSULE BY MOUTH TWICE A DAY 60 capsule 5   losartan  (COZAAR ) 25 MG tablet TAKE 1 TABLET (25 MG TOTAL) BY MOUTH DAILY. 90 tablet 2   memantine  (NAMENDA ) 10 MG tablet TAKE 1 TABLET BY MOUTH TWICE A DAY 180 tablet 4   metoprolol  succinate (TOPROL -XL) 25 MG 24 hr tablet TAKE 1 TABLET (25 MG TOTAL) BY MOUTH DAILY. 90 tablet 2   omeprazole  (PRILOSEC) 20 MG capsule TAKE 1 CAPSULE BY MOUTH EVERY DAY 90 capsule 1   pyridOXINE  (VITAMIN B-6) 100 MG tablet Take 100 mg by mouth daily.     rosuvastatin  (CRESTOR ) 20 MG tablet Take 1 tablet (20 mg total) by mouth daily. 90 tablet 3   Tapentadol HCl (NUCYNTA ER) 250 MG TB12 Take 1 tablet by mouth 2 (two) times daily.     thiamine  (VITAMIN B-1) 100 MG tablet Take 100 mg by mouth daily.     triamcinolone  cream (KENALOG ) 0.1 % APPLY TO LEGS TOPICALLY TWICE A DAY AS NEEDED 30 g 2   No current facility-administered medications on file prior to visit.     Allergies  Allergen Reactions   Epinephrine Other (See Comments) and Palpitations    Heart Races Uncomfortably  Other Reaction(s): irregular heart rate  epinephrine       Physical Exam Vitals requested from patient and listed below if patient had equipment and was able to obtain at home for this virtual visit: There were no vitals filed for this visit. Estimated body mass index is 27.72 kg/m as calculated from the following:   Height as of 08/24/24: 5' 5 (1.651 m).  Weight as of 08/24/24: 166 lb 9.6 oz (75.6 kg).  EKG (optional): deferred due to virtual visit  GENERAL: alert, oriented, no acute distress detected, full vision exam deferred due to pandemic and/or virtual encounter  *** HEENT: atraumatic, conjunttiva clear, no obvious abnormalities on inspection of external nose and ears  NECK: normal movements of the head and neck  LUNGS: on inspection no signs of respiratory distress, breathing rate appears normal, no obvious gross SOB, gasping or wheezing  CV: no obvious cyanosis  MS: moves all visible extremities without noticeable abnormality  PSYCH/NEURO: pleasant and cooperative, no obvious depression or anxiety, speech and thought processing grossly intact, Cognitive function grossly intact  Flowsheet Row Office Visit from 08/24/2024 in [facility] [place]  PHQ-9 Total Score 0        08/24/2024   11:04 AM 06/05/2023    9:55 AM 01/07/2023    4:07 PM 08/27/2022    1:54 PM 05/25/2022    2:34 PM  Depression screen PHQ 2/9  Decreased Interest 0 1 0 0 0  Down, Depressed, Hopeless 0 1 0 0 0  PHQ - 2 Score 0 2 0 0 0  Altered sleeping 0 0  0 0  Tired, decreased energy 0 0  0 0  Change in appetite 0 0  0 0  Feeling bad or failure about yourself  0 0  0 0  Trouble concentrating 0 0  0 0  Moving slowly or fidgety/restless 0 0  0 0  Suicidal thoughts 0 0  0 0  PHQ-9 Score 0 2  0 0  Difficult doing work/chores Not difficult at all  Somewhat difficult  Not difficult at all Not difficult at all       01/28/2022   10:00 AM 08/27/2022    1:54 PM 12/28/2022    4:17 PM 06/05/2023    9:55 AM 08/24/2024   11:03 AM  Fall Risk  Falls in the past year?  0 0 1 1  Was there an injury with Fall?  0 0 0 0  Fall Risk Category Calculator  0 0 1 1  Fall Risk Category (Retired)  Low      (RETIRED) Patient Fall Risk Level Moderate fall risk  Moderate fall risk      Patient at Risk for Falls Due to  History of fall(s) No Fall Risks History of fall(s) History of fall(s)  Fall risk Follow up  Falls evaluation completed  Falls prevention discussed Falls evaluation completed      Data saved with a previous flowsheet row definition     SUMMARY AND PLAN:  No diagnosis found.  Visit coding *** [phone] (annual wellness visit -initial); G0439 (annual wellness subsequent); G0402 Welcome to Medicare(initial preventive physical exam)   Discussed applicable health maintenance/preventive health measures and advised and referred or ordered per patient preferences:  Health Maintenance  Topic Date Due   Medicare Annual Wellness (AWV)  12/29/2023   COVID-19 Vaccine (8 - 2025-26 season) 07/13/2024   Pneumococcal Vaccine: 50+ Years (2 of 2 - PCV) 05/05/2025 (Originally 12/18/2021)   DTaP/Tdap/Td (2 - Td or Tdap) 12/14/2030   Influenza Vaccine  Completed   DEXA SCAN  Completed   Hepatitis C Screening  Completed   Zoster Vaccines- Shingrix  Completed   Meningococcal B Vaccine  Aged Out   Mammogram  Discontinued   Colonoscopy  Discontinued      Education and counseling on the following was provided based on the above review  of health and a plan/checklist for the patient, along with additional information discussed, was provided for the patient in the patient instructions :  -Advised on importance of completing advanced directives, discussed options for completing and provided information in patient instructions as well -Provided counseling  and plan for difficulty hearing  -Provided counseling and plan for increased risk of falling if applicable per above screening. Reviewed and demonstrated safe balance exercises that can be done at home to improve balance and discussed exercise guidelines for adults with include balance exercises at least 3 days per week.  -Advised and counseled on a healthy lifestyle - including the importance of a healthy diet, regular physical activity, social connections and stress management. -Reviewed patient's current diet. Advised and counseled on a whole foods based healthy diet. A summary of a healthy diet was provided in the Patient Instructions.  -reviewed patient's current physical activity level and discussed exercise guidelines for adults. Discussed community resources and ideas for safe exercise at home to assist in meeting exercise guideline recommendations in a safe and healthy way.  -Advise yearly dental visits at minimum and regular eye exams -Advised and counseled on alcohol safe limits, risks/ tobacco use, risks of smoking and offered counseling/help, drug, opoid use/misuse   Follow up: see patient instructions     There are no Patient Instructions on file for this visit.  Chiquita JONELLE Cramp, DO

## 2024-08-25 NOTE — Assessment & Plan Note (Signed)
 Currently on Nucynta ER 250 mg twice daily and gabapentin  300 mg twice daily. She would like a referral to a new provider here in town, referral placed.

## 2024-08-25 NOTE — Assessment & Plan Note (Signed)
 Last creatinine 1.4 and eGFR 36. Stressed the importance of adequate hydration and BP control. Continue low-salt diet and avoidance of NSAIDs.

## 2024-08-25 NOTE — Assessment & Plan Note (Signed)
 Following with neurologist. Currently she is on Namenda  10 mg twice daily.

## 2024-09-11 NOTE — Progress Notes (Signed)
   09/11/2024  Patient ID: Rebecca Baird, female   DOB: February 25, 1946, 78 y.o.   MRN: [id number]  Pharmacy Quality Measure Review  This patient is appearing on a report for being at risk of failing the adherence measure for cholesterol (statin) medications this calendar year.   Medication: Rosuvastatin  Last fill date: 08/28/24 for 90 day supply  Insurance report was not up to date. No action needed at this time.   Jon VEAR Lindau, PharmD Clinical Pharmacist [phone]

## 2024-09-28 ENCOUNTER — Encounter: Payer: Self-pay | Admitting: Neurology

## 2024-09-28 ENCOUNTER — Ambulatory Visit: Admitting: Neurology

## 2024-09-28 VITALS — BP 127/88 | HR 93 | Ht 65.0 in | Wt 165.0 lb

## 2024-09-28 DIAGNOSIS — R269 Unspecified abnormalities of gait and mobility: Secondary | ICD-10-CM

## 2024-09-28 DIAGNOSIS — R4189 Other symptoms and signs involving cognitive functions and awareness: Secondary | ICD-10-CM

## 2024-09-28 DIAGNOSIS — M545 Low back pain, unspecified: Secondary | ICD-10-CM

## 2024-09-28 DIAGNOSIS — M4802 Spinal stenosis, cervical region: Secondary | ICD-10-CM | POA: Diagnosis not present

## 2024-09-28 DIAGNOSIS — G8929 Other chronic pain: Secondary | ICD-10-CM

## 2024-09-28 MED ORDER — DONEPEZIL HCL 10 MG PO TABS: 10.0000 mg | ORAL_TABLET | Freq: Every day | ORAL | 11 refills | Status: AC

## 2024-09-28 NOTE — Patient Instructions (Signed)
Meds ordered this encounter  Medications  . donepezil (ARICEPT) 10 MG tablet    Sig: Take 1 tablet (10 mg total) by mouth at bedtime.    Dispense:  30 tablet    Refill:  11

## 2024-09-28 NOTE — Progress Notes (Unsigned)
 ASSESSMENT AND PLAN  Rebecca Baird is a 78 y.o. female     Memory loss  MoCA examination 21/30, mother has suffered Alzheimer's disease,  MRI of the brain for evaluation showed generalized atrophy  Most concern for central nervous system degenerative disorder such as Alzheimer's disease,  Laboratory evaluations, including ATN profile  Reported improvement with Namenda  10 mg twice a day, add on Aricept 10 mg daily  Gradual worsening gait abnormality  MRI of cervical spine showed moderate canal stenosis at C3-4,  MRI of lumbar showed multilevel degenerative changes variable degree of foraminal narrowing  She was seen by neurosurgeon, hesitated to go through surgery,  Referred to physical therapy Return in 6 months  DIAGNOSTIC DATA (LABS, IMAGING, TESTING) - I reviewed patient records, labs, notes, testing and imaging myself where available.  Geriatric Depression Scale 2 prefer to stay at home, lack of energy, Functional Activities Questionnaire: 10, difficulty writing checks, assembly tax record, shopping alone, remembering family medications, travel out of neighborhood  or driving   MEDICAL HISTORY:  Rebecca Baird is a 78 year old female seen in request by her primary care doctor Jordan, [place] for evaluation of bilateral feet paresthesia, memory loss, initial evaluation was on Mar 23, 2024    History is obtained from the patient and review of electronic medical records. I personally reviewed pertinent available imaging films in PACS.   PMHx of  HLD HTN GERD Memory loss,  History of right ankle fracture.  She lives with her husband, drinking 6 to 8 ounce wines daily, mother has Alzheimer's, is a retired midwife, over the past couple years, she noticed slow onset memory loss, she is very anxious about it, MoCA examination 21/30, missed 5 out of 5 delayed recall  She still sees her primary care at [facility], she moved from [place] to [place] few  years back, had 2 years history of gradual onset bilateral hands and feet paresthesia, ascending, to knee level now, mild gait abnormality, have to use cane, she denies significant bowel and bladder incontinence  Virtual Visit via video UPDATE April 23 2024: She is with her husband at today's virtual visit, to review MRIs, MRI of the brain, mild atrophy, no acute abnormality MRI of cervical spine multilevel degenerative changes most noticeable at C3-4, moderate canal stenosis no cord signal abnormality MRI of the lumbar spine multilevel degenerative changes most prominent at L4-5, mild canal stenosis, variable degree of foraminal narrowing  She does complain slow worsening gait abnormality, there is evidence of hyperreflexia unsteady gait on examination, she is also very concerned about her memory loss   UPDATE Sep 28 2024: She is concerned about worsening memory loss, staying up late at nighttime, sleep in late during the day, losing weight, decreased appetite, denies incontinence, her hands often feel numbness, previous MRI of cervical spine finding moderate stenosis at C3-4, was seen by neurosurgeon, is hesitant to go through surgery, but to concerned about slow Worsening gait abnormality upper extremity paresthesia weakness  She has quit driving around 7975, started Namenda  10 mg twice a week since June 2025, was helpful, take the memory edge of   PHYSICAL EXAMNIATION:    09/29/2024    1:15 PM 09/28/2024    2:58 PM 08/24/2024   11:44 AM  Vitals with BMI  Height 5' 5 5' 5   Weight 165 lbs 165 lbs   BMI 27.46 27.46   Systolic  127 138  Diastolic  88 80  Pulse  93  Gen: NAD, conversant, well nourised, well groomed                     Cardiovascular: Regular rate rhythm, no peripheral edema, warm, nontender. Eyes: Conjunctivae clear without exudates or hemorrhage Neck: Supple, no carotid bruits. Pulmonary: Clear to auscultation bilaterally   NEUROLOGICAL EXAM:  MENTAL  STATUS: Speech/cognition: Awake, alert oriented to history taking and casual conversation    09/28/2024    3:03 PM 03/23/2024    2:00 PM  Montreal Cognitive Assessment   Visuospatial/ Executive (0/5) 5 4  Naming (0/3) 3 3  Attention: Read list of digits (0/2) 2 2  Attention: Read list of letters (0/1) 1 1  Attention: Serial 7 subtraction starting at 100 (0/3) 3 1  Language: Repeat phrase (0/2) 2 2  Language : Fluency (0/1) 1 1  Abstraction (0/2) 2 2  Delayed Recall (0/5) 0 0  Orientation (0/6) 4 5  Total 23 21     CRANIAL NERVES: CN II: Visual fields are full to confrontation.  Pupils are round equal and briskly reactive to light. CN III, IV, VI: extraocular movement are normal. No ptosis. CN V: Facial sensation is intact to pinprick in all 3 divisions bilaterally. Corneal responses are intact.  CN VII: Face is symmetric with normal eye closure and smile. CN VIII: Hearing is normal to casual conversation CN IX, X: Palate elevates symmetrically. Phonation is normal. CN XI: Head turning and shoulder shrug are intact CN XII: Tongue is midline with normal movements and no atrophy.  MOTOR: mild bilateral shoulder abduction, hip flexion weakness, moderate bilateral ankle dorsiflexion weakness  REFLEXES: Reflexes are 2+ and symmetric at the biceps, triceps, knees, and absent at ankles.  SENSORY: Mildly length-dependent sensory changes to light touch pinprick vibratory sensation to below knee level  COORDINATION: Rapid alternating movements and fine finger movements are intact. There is no dysmetria on finger-to-nose and heel-knee-shin.    GAIT/STANCE: Push-up from seated position, unsteady, bilateral foot drop, stiff    REVIEW OF SYSTEMS:  Full 14 system review of systems performed and notable only for as above All other review of systems were negative.   ALLERGIES: Allergies  Allergen Reactions   Epinephrine Other (See Comments) and Palpitations    Heart Races  Uncomfortably  Other Reaction(s): irregular heart rate  epinephrine    HOME MEDICATIONS: Current Outpatient Medications  Medication Sig Dispense Refill   alendronate  (FOSAMAX ) 70 MG tablet TAKE 1 TABLET BY MOUTH EVERY WEEK. TAKE WITH A FULL GLASS OF WATER ON AN EMPTY STOMACH. 12 tablet 2   Ascorbic Acid (VITAMIN C) 1000 MG tablet Take 1,000 mg by mouth daily.     aspirin  EC 81 MG tablet Take 81 mg by mouth daily. Swallow whole.     folic acid  (FOLVITE ) 800 MCG tablet Take 800 mcg by mouth daily.     furosemide  (LASIX ) 20 MG tablet TAKE 1 TABLET BY MOUTH EVERY DAY 90 tablet 2   gabapentin  (NEURONTIN ) 300 MG capsule TAKE 1 CAPSULE BY MOUTH TWICE A DAY (Patient taking differently: Take 300 mg by mouth at bedtime.) 60 capsule 5   losartan  (COZAAR ) 25 MG tablet TAKE 1 TABLET (25 MG TOTAL) BY MOUTH DAILY. 90 tablet 2   memantine  (NAMENDA ) 10 MG tablet TAKE 1 TABLET BY MOUTH TWICE A DAY 180 tablet 4   metoprolol  succinate (TOPROL -XL) 25 MG 24 hr tablet TAKE 1 TABLET (25 MG TOTAL) BY MOUTH DAILY. 90 tablet 2   omeprazole  (PRILOSEC) 20  MG capsule TAKE 1 CAPSULE BY MOUTH EVERY DAY 90 capsule 1   pyridOXINE  (VITAMIN B-6) 100 MG tablet Take 100 mg by mouth daily.     rosuvastatin  (CRESTOR ) 20 MG tablet Take 1 tablet (20 mg total) by mouth daily. 90 tablet 3   Tapentadol HCl (NUCYNTA ER) 250 MG TB12 Take 1 tablet by mouth 2 (two) times daily.     thiamine  (VITAMIN B-1) 100 MG tablet Take 100 mg by mouth daily.     triamcinolone  cream (KENALOG ) 0.1 % APPLY TO LEGS TOPICALLY TWICE A DAY AS NEEDED 30 g 2   No current facility-administered medications for this visit.    PAST MEDICAL HISTORY: Past Medical History:  Diagnosis Date   Anxiety    Depression    GERD (gastroesophageal reflux disease)    Hypertension    Peripheral neuropathy     PAST SURGICAL HISTORY: Past Surgical History:  Procedure Laterality Date   broken ankle     cataract surgery     NOSE SURGERY      FAMILY  HISTORY: Family History  Problem Relation Age of Onset   Breast cancer Mother    Breast cancer Sister 79   Breast cancer Cousin     SOCIAL HISTORY: Social History   Socioeconomic History   Marital status: Married    Spouse name: Not on file   Number of children: 1   Years of education: Not on file   Highest education level: Associate degree: academic program  Occupational History   Occupation: retired  Tobacco Use   Smoking status: Former    Current packs/day: 0.00    Types: Cigarettes    Quit date: 1984    Years since quitting: 41.9   Smokeless tobacco: Never  Vaping Use   Vaping status: Never Used  Substance and Sexual Activity   Alcohol use: Yes    Alcohol/week: 3.0 standard drinks of alcohol    Types: 3 Glasses of wine per week    Comment: Drinks Wine   Drug use: Yes    Types: Other-see comments    Comment: nucytha   Sexual activity: Not on file  Other Topics Concern   Not on file  Social History Narrative   Right handed   One story home   Drinks occasional caffeine   Social Drivers of Health   Financial Resource Strain: Low Risk  (12/28/2022)   Overall Financial Resource Strain (CARDIA)    Difficulty of Paying Living Expenses: Not hard at all  Food Insecurity: No Food Insecurity (12/28/2022)   Hunger Vital Sign    Worried About Running Out of Food in the Last Year: Never true    Ran Out of Food in the Last Year: Never true  Transportation Needs: No Transportation Needs (12/28/2022)   PRAPARE - Administrator, Civil Service (Medical): No    Lack of Transportation (Non-Medical): No  Physical Activity: Sufficiently Active (12/28/2022)   Exercise Vital Sign    Days of Exercise per Week: 5 days    Minutes of Exercise per Session: 30 min  Stress: No Stress Concern Present (12/28/2022)   [employer] of Occupational Health - Occupational Stress Questionnaire    Feeling of Stress : Not at all  Social Connections: Socially Integrated (12/28/2022)    Social Connection and Isolation Panel    Frequency of Communication with Friends and Family: More than three times a week    Frequency of Social Gatherings with Friends and Family: More than three  times a week    Attends Religious Services: More than 4 times per year    Active Member of Clubs or Organizations: Yes    Attends Banker Meetings: More than 4 times per year    Marital Status: Married  Catering Manager Violence: Not At Risk (12/28/2022)   Humiliation, Afraid, Rape, and Kick questionnaire    Fear of Current or Ex-Partner: No    Emotionally Abused: No    Physically Abused: No    Sexually Abused: No      Modena Callander, M.D. Ph.D.  [facility] [address], [address] [place], [place] [address] Ph: [phone] Fax: [phone]  CC:  Jordan, Betty G, MD [address] [place],  [place] [address]  Jordan, Betty G, MD

## 2024-09-29 ENCOUNTER — Telehealth: Payer: Self-pay | Admitting: Neurology

## 2024-09-29 ENCOUNTER — Ambulatory Visit

## 2024-09-29 VITALS — Ht 65.0 in | Wt 165.0 lb

## 2024-09-29 DIAGNOSIS — Z0000 Encounter for general adult medical examination without abnormal findings: Secondary | ICD-10-CM

## 2024-09-29 NOTE — Telephone Encounter (Signed)
 [facility] is going to take her.

## 2024-09-29 NOTE — Progress Notes (Cosign Needed)
 Chief Complaint  Patient presents with   Medicare Wellness     Subjective:   Rebecca Baird is a 78 y.o. female who presents for a Medicare Annual Wellness Visit.  Allergies (verified) Epinephrine   History: Past Medical History:  Diagnosis Date   Anxiety    Depression    GERD (gastroesophageal reflux disease)    Hypertension    Peripheral neuropathy    Past Surgical History:  Procedure Laterality Date   broken ankle     cataract surgery     NOSE SURGERY     Family History  Problem Relation Age of Onset   Breast cancer Mother    Breast cancer Sister 90   Breast cancer Cousin    Social History   Occupational History   Occupation: retired  Tobacco Use   Smoking status: Former    Current packs/day: 0.00    Types: Cigarettes    Quit date: 1984    Years since quitting: 41.9   Smokeless tobacco: Never  Vaping Use   Vaping status: Never Used  Substance and Sexual Activity   Alcohol use: Yes    Alcohol/week: 3.0 standard drinks of alcohol    Types: 3 Glasses of wine per week    Comment: Drinks Wine   Drug use: Yes    Types: Other-see comments    Comment: nucytha   Sexual activity: Not on file   Tobacco Counseling Counseling given: Not Answered  SDOH Screenings   Food Insecurity: No Food Insecurity (09/29/2024)  Housing: Low Risk  (09/29/2024)  Transportation Needs: No Transportation Needs (09/29/2024)  Utilities: Not At Risk (09/29/2024)  Alcohol Screen: Low Risk  (09/29/2024)  Depression (PHQ2-9): Low Risk  (09/29/2024)  Financial Resource Strain: Low Risk  (09/29/2024)  Physical Activity: Inactive (09/29/2024)  Social Connections: Moderately Isolated (09/29/2024)  Stress: No Stress Concern Present (09/29/2024)  Tobacco Use: Medium Risk (09/29/2024)  Health Literacy: Adequate Health Literacy (09/29/2024)   See flowsheets for full screening details  Depression Screen PHQ 2 & 9 Depression Scale- Over the past 2 weeks, how often have you  been bothered by any of the following problems? Little interest or pleasure in doing things: 0 Feeling down, depressed, or hopeless (PHQ Adolescent also includes...irritable): 1 PHQ-2 Total Score: 1 Trouble falling or staying asleep, or sleeping too much: 0 Feeling tired or having little energy: 1 Poor appetite or overeating (PHQ Adolescent also includes...weight loss): 0 Feeling bad about yourself - or that you are a failure or have let yourself or your family down: 0 Trouble concentrating on things, such as reading the newspaper or watching television (PHQ Adolescent also includes...like school work): 0 Moving or speaking so slowly that other people could have noticed. Or the opposite - being so fidgety or restless that you have been moving around a lot more than usual: 0 Thoughts that you would be better off dead, or of hurting yourself in some way: 0 PHQ-9 Total Score: 2 If you checked off any problems, how difficult have these problems made it for you to do your work, take care of things at home, or get along with other people?: Not difficult at all     Goals Addressed             This Visit's Progress    Weight (lb) < 200 lb (90.7 kg)   165 lb (74.8 kg)    Would like to some more weight/2025       Visit info / Clinical  Intake: Medicare Wellness Visit Type:: Subsequent Annual Wellness Visit Persons participating in visit:: patient Medicare Wellness Visit Mode:: Video Because this visit was a virtual/telehealth visit:: vitals recorded from last visit If Telephone or Video please confirm:: I connected with the patient using audio enabled telemedicine application and verified that I am speaking with the correct person using two identifiers; I discussed the limitations of evaluation and management by telemedicine Patient Location:: Home Provider Location:: Home Information given by:: patient Interpreter Needed?: No Pre-visit prep was completed: yes AWV questionnaire completed  by patient prior to visit?: yes Date:: 09/29/24 Living arrangements:: lives with spouse/significant other Patient's Overall Health Status Rating: good Typical amount of pain: some Does pain affect daily life?: no Are you currently prescribed opioids?: no  Dietary Habits and Nutritional Risks How many meals a day?: 2 Eats fruit and vegetables daily?: yes Most meals are obtained by: preparing own meals (husband cooks) In the last 2 weeks, have you had any of the following?: none Diabetic:: no  Functional Status Activities of Daily Living (to include ambulation/medication): (!) (Patient-Rptd) Needs Assist Ambulation: Independent with device- listed below Home Assistive Devices/Equipment: Cane; Eyeglasses (when walking uses a cane and sometimes a waker when out) Medication Administration: Independent Home Management: (Patient-Rptd) Needs assistance (comment) Manage your own finances?: (!) no (husband manages) Primary transportation is: facility / other (husband drives it) Concerns about vision?: (!) yes (does not like eye glass RX) Concerns about hearing?: no  Fall Screening Falls in the past year?: (Patient-Rptd) 0 Number of falls in past year: (Patient-Rptd) 0 Was there an injury with Fall?: (Patient-Rptd) 0 Fall Risk Category Calculator: (Patient-Rptd) 0 Patient Fall Risk Level: (Patient-Rptd) Low Fall Risk  Fall Risk Patient at Risk for Falls Due to: No Fall Risks Fall risk Follow up: Falls evaluation completed; Falls prevention discussed  Home and Transportation Safety: All rugs have non-skid backing?: N/A, no rugs All stairs or steps have railings?: N/A, no stairs Grab bars in the bathtub or shower?: yes Have non-skid surface in bathtub or shower?: yes Good home lighting?: yes Regular seat belt use?: yes Hospital stays in the last year:: no  Cognitive Assessment Difficulty concentrating, remembering, or making decisions? : yes Will 6CIT or Mini Cog be Completed:  yes What year is it?: 0 points What month is it?: 0 points Give patient an address phrase to remember (5 components): [address], [place] About what time is it?: 0 points Count backwards from 20 to 1: 0 points Say the months of the year in reverse: 0 points Repeat the address phrase from earlier: 0 points 6 CIT Score: 0 points  Advance Directives (For Healthcare) Does Patient Have a Medical Advance Directive?: No  Reviewed/Updated  Reviewed/Updated: Reviewed All (Medical, Surgical, Family, Medications, Allergies, Care Teams, Patient Goals)        Objective:    Today's Vitals   09/29/24 1315  Weight: 165 lb (74.8 kg)  Height: 5' 5 (1.651 m)   Body mass index is 27.46 kg/m.  Current Medications (verified) Outpatient Encounter Medications as of 09/29/2024  Medication Sig   alendronate  (FOSAMAX ) 70 MG tablet TAKE 1 TABLET BY MOUTH EVERY WEEK. TAKE WITH A FULL GLASS OF WATER ON AN EMPTY STOMACH.   Ascorbic Acid (VITAMIN C) 1000 MG tablet Take 1,000 mg by mouth daily.   aspirin  EC 81 MG tablet Take 81 mg by mouth daily. Swallow whole.   donepezil  (ARICEPT ) 10 MG tablet Take 1 tablet (10 mg total) by mouth at bedtime.  folic acid  (FOLVITE ) 800 MCG tablet Take 800 mcg by mouth daily.   furosemide  (LASIX ) 20 MG tablet TAKE 1 TABLET BY MOUTH EVERY DAY   gabapentin  (NEURONTIN ) 300 MG capsule TAKE 1 CAPSULE BY MOUTH TWICE A DAY (Patient taking differently: Take 300 mg by mouth at bedtime.)   losartan  (COZAAR ) 25 MG tablet TAKE 1 TABLET (25 MG TOTAL) BY MOUTH DAILY.   memantine  (NAMENDA ) 10 MG tablet TAKE 1 TABLET BY MOUTH TWICE A DAY   metoprolol  succinate (TOPROL -XL) 25 MG 24 hr tablet TAKE 1 TABLET (25 MG TOTAL) BY MOUTH DAILY.   omeprazole  (PRILOSEC) 20 MG capsule TAKE 1 CAPSULE BY MOUTH EVERY DAY   pyridOXINE  (VITAMIN B-6) 100 MG tablet Take 100 mg by mouth daily.   rosuvastatin  (CRESTOR ) 20 MG tablet Take 1 tablet (20 mg total) by mouth daily.   Tapentadol HCl (NUCYNTA ER)  250 MG TB12 Take 1 tablet by mouth 2 (two) times daily.   thiamine  (VITAMIN B-1) 100 MG tablet Take 100 mg by mouth daily.   triamcinolone  cream (KENALOG ) 0.1 % APPLY TO LEGS TOPICALLY TWICE A DAY AS NEEDED   No facility-administered encounter medications on file as of 09/29/2024.   Hearing/Vision screen Hearing Screening - Comments:: Denies hearing difficulties   Vision Screening - Comments:: Wears eyeglasses/[employer]/UTD Immunizations and Health Maintenance Health Maintenance  Topic Date Due   COVID-19 Vaccine (8 - 2025-26 season) 07/13/2024   Pneumococcal Vaccine: 50+ Years (2 of 2 - PCV) 05/05/2025 (Originally 12/18/2021)   Medicare Annual Wellness (AWV)  09/29/2025   DTaP/Tdap/Td (2 - Td or Tdap) 12/14/2030   Influenza Vaccine  Completed   DEXA SCAN  Completed   Hepatitis C Screening  Completed   Zoster Vaccines- Shingrix  Completed   Meningococcal B Vaccine  Aged Out   Mammogram  Discontinued   Colonoscopy  Discontinued        Assessment/Plan:  This is a routine wellness examination for Rebecca Baird.  Patient Care Team: Jordan, Betty G, MD as PCP - General (Family Medicine) Patel, Donika K, DO as Consulting Physician (Neurology)  I have personally reviewed and noted the following in the patient's chart:   Medical and social history Use of alcohol, tobacco or illicit drugs  Current medications and supplements including opioid prescriptions. Functional ability and status Nutritional status Physical activity Advanced directives List of other physicians Hospitalizations, surgeries, and ER visits in previous 12 months Vitals Screenings to include cognitive, depression, and falls Referrals and appointments  No orders of the defined types were placed in this encounter.  In addition, I have reviewed and discussed with patient certain preventive protocols, quality metrics, and best practice recommendations. A written personalized care plan for preventive services as well as  general preventive health recommendations were provided to patient.   Rebecca Baird, CMA   09/30/2024   Return in 1 year (on 09/29/2025).  After Visit Summary: (MyChart) Due to this being a telephonic visit, the after visit summary with patients personalized plan was offered to patient via MyChart   Nurse Notes: Patient is up to date on all health maintenance with no concerns to address today.

## 2024-09-29 NOTE — Patient Instructions (Addendum)
 Rebecca Baird,  Thank you for taking the time for your Medicare Wellness Visit. I appreciate your continued commitment to your health goals. Please review the care plan we discussed, and feel free to reach out if I can assist you further.  Please note that Annual Wellness Visits do not include a physical exam. Some assessments may be limited, especially if the visit was conducted virtually. If needed, we may recommend an in-person follow-up with your provider.  Ongoing Care Seeing your primary care provider every 3 to 6 months helps us  monitor your health and provide consistent, personalized care. Last office visit on 08/24/2024.  Remember to find out if you already had your bone density screening.  Each day, aim for 6 glasses of water, plenty of protein in your diet and try to get up and walk/ stretch every hour for 5-10 minutes at a time.    Referrals If a referral was made during today's visit and you haven't received any updates within two weeks, please contact the referred provider directly to check on the status.  Recommended Screenings:  Health Maintenance  Topic Date Due   COVID-19 Vaccine (8 - 2025-26 season) 07/13/2024   Pneumococcal Vaccine for age over 63 (2 of 2 - PCV) 05/05/2025*   Medicare Annual Wellness Visit  09/29/2025   DTaP/Tdap/Td vaccine (2 - Td or Tdap) 12/14/2030   Flu Shot  Completed   DEXA scan (bone density measurement)  Completed   Hepatitis C Screening  Completed   Zoster (Shingles) Vaccine  Completed   Meningitis B Vaccine  Aged Out   Breast Cancer Screening  Discontinued   Colon Cancer Screening  Discontinued  *Topic was postponed. The date shown is not the original due date.       09/29/2024   12:29 PM  Advanced Directives  Does Patient Have a Medical Advance Directive? No    Vision: Annual vision screenings are recommended for early detection of glaucoma, cataracts, and diabetic retinopathy. These exams can also reveal signs of chronic conditions  such as diabetes and high blood pressure.  Dental: Annual dental screenings help detect early signs of oral cancer, gum disease, and other conditions linked to overall health, including heart disease and diabetes.  Please see the attached documents for additional preventive care recommendations.

## 2024-09-30 NOTE — Telephone Encounter (Signed)
 Pt called to request number for PT with [facility] ,

## 2024-10-01 ENCOUNTER — Ambulatory Visit: Payer: Self-pay | Admitting: Neurology

## 2024-10-01 LAB — RPR: RPR Ser Ql: NONREACTIVE

## 2024-10-01 LAB — ATN PROFILE
A -- Beta-amyloid 42/40 Ratio: 0.118 (ref >0.102)
Beta-amyloid 40: 249 pg/mL
Beta-amyloid 42: 29.41 pg/mL
N -- NfL, Plasma: 5.8 pg/mL (ref 0.00–6.04)
T -- p-tau181: 3.04 pg/mL — AB (ref 0.00–0.97)

## 2024-10-01 LAB — VITAMIN B12: Vitamin B-12: 1868 pg/mL — ABNORMAL HIGH (ref 232–1245)

## 2024-10-05 ENCOUNTER — Telehealth: Payer: Self-pay | Admitting: Neurology

## 2024-10-05 NOTE — Telephone Encounter (Signed)
 [place], PT w/ [facility] has called for verbal orders for PT 1 week 9 Chris# with secure vm is [phone]

## 2024-10-06 NOTE — Telephone Encounter (Signed)
 Called [facility] and verbal orders approved.

## 2024-10-14 DIAGNOSIS — Z79891 Long term (current) use of opiate analgesic: Secondary | ICD-10-CM | POA: Diagnosis not present

## 2024-10-15 ENCOUNTER — Other Ambulatory Visit: Payer: Self-pay | Admitting: Family Medicine

## 2024-11-16 ENCOUNTER — Telehealth: Payer: Self-pay | Admitting: Neurology

## 2024-11-16 NOTE — Telephone Encounter (Signed)
 Ok to change aricept  10mg  every morning after breakfast, try that for 2 weeks, if he still has side effect, then stop

## 2024-11-16 NOTE — Telephone Encounter (Signed)
 Pt called to request  to speak to Md , Pt stated  that   she is having a lot of isomnis and can't sleep , Pt has a  friend who is NP and informed  Pt that it could be the medication making  Pt feel this way . Pt would like to know if its possible for her to stop taking memantine  (NAMENDA ) 10 MG tablet  .

## 2024-11-16 NOTE — Telephone Encounter (Signed)
 I spoke to pt.  She stated from her last visit when donepezil  was added that she has noted insomnia, worsening vivid dreams.  She has been taking her donepezil  in pm.  I told her will check with Dr. Onita, she may change dose to taking in AM and see if that makes difference.  It is noted from last visit note that she was tolerating memantine  with no problems.

## 2024-11-17 NOTE — Telephone Encounter (Signed)
 I called pt and relayed that she can take the donepezil  10mg  po qam for 2 wks and see how things go if continued side effects then stop.  Pt said that she will call back after 2 wks.  She will remain on memantine  10mg  po bid as was on previously with no issues.  She verbalized understanding and wrote insrtuctions  down.  She appreciated call back.
